# Patient Record
Sex: Female | Born: 1950
Health system: Southern US, Community
[De-identification: ages and names within clinical notes are randomized; demographics above are authoritative.]

## PROBLEM LIST (undated history)

## (undated) DIAGNOSIS — I10 Essential (primary) hypertension: Secondary | ICD-10-CM

## (undated) DIAGNOSIS — I493 Ventricular premature depolarization: Secondary | ICD-10-CM

## (undated) DIAGNOSIS — Z8701 Personal history of pneumonia (recurrent): Secondary | ICD-10-CM

## (undated) DIAGNOSIS — IMO0001 Reserved for inherently not codable concepts without codable children: Secondary | ICD-10-CM

## (undated) DIAGNOSIS — G4730 Sleep apnea, unspecified: Secondary | ICD-10-CM

## (undated) DIAGNOSIS — Z973 Presence of spectacles and contact lenses: Secondary | ICD-10-CM

## (undated) DIAGNOSIS — N393 Stress incontinence (female) (male): Secondary | ICD-10-CM

## (undated) DIAGNOSIS — E7800 Pure hypercholesterolemia, unspecified: Secondary | ICD-10-CM

## (undated) DIAGNOSIS — K635 Polyp of colon: Secondary | ICD-10-CM

## (undated) DIAGNOSIS — I471 Supraventricular tachycardia: Secondary | ICD-10-CM

## (undated) DIAGNOSIS — C541 Malignant neoplasm of endometrium: Secondary | ICD-10-CM

## (undated) DIAGNOSIS — I4719 Other supraventricular tachycardia: Secondary | ICD-10-CM

## (undated) DIAGNOSIS — Z87448 Personal history of other diseases of urinary system: Secondary | ICD-10-CM

## (undated) DIAGNOSIS — F32A Depression, unspecified: Secondary | ICD-10-CM

## (undated) DIAGNOSIS — T7840XA Allergy, unspecified, initial encounter: Secondary | ICD-10-CM

## (undated) DIAGNOSIS — R200 Anesthesia of skin: Secondary | ICD-10-CM

## (undated) DIAGNOSIS — M1990 Unspecified osteoarthritis, unspecified site: Secondary | ICD-10-CM

## (undated) DIAGNOSIS — Z8659 Personal history of other mental and behavioral disorders: Secondary | ICD-10-CM

## (undated) DIAGNOSIS — Z9889 Other specified postprocedural states: Secondary | ICD-10-CM

## (undated) DIAGNOSIS — J45909 Unspecified asthma, uncomplicated: Secondary | ICD-10-CM

## (undated) DIAGNOSIS — R112 Nausea with vomiting, unspecified: Secondary | ICD-10-CM

## (undated) DIAGNOSIS — H269 Unspecified cataract: Secondary | ICD-10-CM

## (undated) HISTORY — DX: Unspecified asthma, uncomplicated: J45.909

## (undated) HISTORY — PX: BREAST SURGERY: SHX581

## (undated) HISTORY — DX: Other supraventricular tachycardia: I47.19

## (undated) HISTORY — DX: Unspecified cataract: H26.9

## (undated) HISTORY — DX: Sleep apnea, unspecified: G47.30

## (undated) HISTORY — PX: ABDOMINAL HYSTERECTOMY: SHX81

## (undated) HISTORY — PX: TOTAL HIP ARTHROPLASTY: SHX124

## (undated) HISTORY — DX: Supraventricular tachycardia: I47.1

## (undated) HISTORY — PX: SPINE SURGERY: SHX786

## (undated) HISTORY — DX: Allergy, unspecified, initial encounter: T78.40XA

## (undated) HISTORY — DX: Depression, unspecified: F32.A

## (undated) HISTORY — DX: Essential (primary) hypertension: I10

## (undated) HISTORY — PX: COLONOSCOPY: SHX174

## (undated) HISTORY — PX: HYSTEROSCOPY WITH D & C: SHX1775

## (undated) HISTORY — DX: Malignant neoplasm of endometrium: C54.1

## (undated) HISTORY — DX: Ventricular premature depolarization: I49.3

## (undated) HISTORY — DX: Unspecified osteoarthritis, unspecified site: M19.90

## (undated) HISTORY — PX: JOINT REPLACEMENT: SHX530

## (undated) SURGERY — Surgical Case
Anesthesia: *Unknown

---

## 1979-04-07 HISTORY — PX: BREAST SURGERY: SHX581

## 1999-10-10 ENCOUNTER — Other Ambulatory Visit: Admission: RE | Admit: 1999-10-10 | Discharge: 1999-10-10 | Payer: Self-pay | Admitting: Obstetrics & Gynecology

## 1999-11-14 ENCOUNTER — Encounter: Payer: Self-pay | Admitting: *Deleted

## 1999-11-14 ENCOUNTER — Encounter: Admission: RE | Admit: 1999-11-14 | Discharge: 1999-11-14 | Payer: Self-pay | Admitting: *Deleted

## 2000-04-03 ENCOUNTER — Ambulatory Visit (HOSPITAL_COMMUNITY): Admission: RE | Admit: 2000-04-03 | Discharge: 2000-04-03 | Payer: Self-pay | Admitting: Gastroenterology

## 2000-12-12 ENCOUNTER — Encounter: Payer: Self-pay | Admitting: Orthopedic Surgery

## 2000-12-17 ENCOUNTER — Encounter: Payer: Self-pay | Admitting: Orthopedic Surgery

## 2000-12-17 ENCOUNTER — Inpatient Hospital Stay (HOSPITAL_COMMUNITY): Admission: RE | Admit: 2000-12-17 | Discharge: 2000-12-21 | Payer: Self-pay | Admitting: Orthopedic Surgery

## 2001-05-06 ENCOUNTER — Other Ambulatory Visit: Admission: RE | Admit: 2001-05-06 | Discharge: 2001-05-06 | Payer: Self-pay | Admitting: Obstetrics & Gynecology

## 2002-07-14 ENCOUNTER — Other Ambulatory Visit: Admission: RE | Admit: 2002-07-14 | Discharge: 2002-07-14 | Payer: Self-pay | Admitting: Obstetrics & Gynecology

## 2002-08-06 HISTORY — PX: TOTAL HIP ARTHROPLASTY: SHX124

## 2003-02-05 ENCOUNTER — Encounter: Payer: Self-pay | Admitting: Orthopedic Surgery

## 2003-02-09 ENCOUNTER — Encounter: Payer: Self-pay | Admitting: Orthopedic Surgery

## 2003-02-09 ENCOUNTER — Inpatient Hospital Stay (HOSPITAL_COMMUNITY): Admission: RE | Admit: 2003-02-09 | Discharge: 2003-02-13 | Payer: Self-pay | Admitting: Orthopedic Surgery

## 2003-08-20 ENCOUNTER — Other Ambulatory Visit: Admission: RE | Admit: 2003-08-20 | Discharge: 2003-08-20 | Payer: Self-pay | Admitting: Obstetrics & Gynecology

## 2004-09-06 ENCOUNTER — Other Ambulatory Visit: Admission: RE | Admit: 2004-09-06 | Discharge: 2004-09-06 | Payer: Self-pay | Admitting: Obstetrics & Gynecology

## 2005-05-11 ENCOUNTER — Ambulatory Visit (HOSPITAL_COMMUNITY): Admission: RE | Admit: 2005-05-11 | Discharge: 2005-05-11 | Payer: Self-pay | Admitting: Gastroenterology

## 2005-10-19 ENCOUNTER — Other Ambulatory Visit: Admission: RE | Admit: 2005-10-19 | Discharge: 2005-10-19 | Payer: Self-pay | Admitting: Obstetrics & Gynecology

## 2010-07-06 ENCOUNTER — Encounter: Payer: Self-pay | Admitting: Family Medicine

## 2010-07-06 LAB — HM COLONOSCOPY: HM Colonoscopy: 5

## 2010-07-06 LAB — HM PAP SMEAR

## 2010-07-06 LAB — CONVERTED CEMR LAB
Pap Smear: NORMAL
Pap Smear: NORMAL

## 2010-07-21 LAB — HM MAMMOGRAPHY: HM Mammogram: NORMAL

## 2010-09-15 ENCOUNTER — Ambulatory Visit (INDEPENDENT_AMBULATORY_CARE_PROVIDER_SITE_OTHER): Payer: BC Managed Care – PPO | Admitting: Family Medicine

## 2010-09-15 ENCOUNTER — Encounter (INDEPENDENT_AMBULATORY_CARE_PROVIDER_SITE_OTHER): Payer: Self-pay | Admitting: *Deleted

## 2010-09-15 ENCOUNTER — Encounter: Payer: Self-pay | Admitting: Family Medicine

## 2010-09-15 DIAGNOSIS — J301 Allergic rhinitis due to pollen: Secondary | ICD-10-CM | POA: Insufficient documentation

## 2010-09-15 DIAGNOSIS — I10 Essential (primary) hypertension: Secondary | ICD-10-CM | POA: Insufficient documentation

## 2010-09-15 DIAGNOSIS — Z860100 Personal history of colon polyps, unspecified: Secondary | ICD-10-CM | POA: Insufficient documentation

## 2010-09-15 DIAGNOSIS — J4520 Mild intermittent asthma, uncomplicated: Secondary | ICD-10-CM | POA: Insufficient documentation

## 2010-09-15 DIAGNOSIS — K219 Gastro-esophageal reflux disease without esophagitis: Secondary | ICD-10-CM | POA: Insufficient documentation

## 2010-09-15 DIAGNOSIS — R002 Palpitations: Secondary | ICD-10-CM | POA: Insufficient documentation

## 2010-09-15 DIAGNOSIS — N39498 Other specified urinary incontinence: Secondary | ICD-10-CM | POA: Insufficient documentation

## 2010-09-15 DIAGNOSIS — F411 Generalized anxiety disorder: Secondary | ICD-10-CM | POA: Insufficient documentation

## 2010-09-15 DIAGNOSIS — E7800 Pure hypercholesterolemia, unspecified: Secondary | ICD-10-CM | POA: Insufficient documentation

## 2010-09-15 DIAGNOSIS — Z87448 Personal history of other diseases of urinary system: Secondary | ICD-10-CM | POA: Insufficient documentation

## 2010-09-15 DIAGNOSIS — IMO0002 Reserved for concepts with insufficient information to code with codable children: Secondary | ICD-10-CM | POA: Insufficient documentation

## 2010-09-15 DIAGNOSIS — M4800 Spinal stenosis, site unspecified: Secondary | ICD-10-CM | POA: Insufficient documentation

## 2010-09-15 DIAGNOSIS — Z8659 Personal history of other mental and behavioral disorders: Secondary | ICD-10-CM | POA: Insufficient documentation

## 2010-09-15 LAB — CONVERTED CEMR LAB
Cholesterol: 161 mg/dL
HCT: 38.4 %
HDL: 82 mg/dL
Hemoglobin: 13 g/dL
LDL Cholesterol: 55 mg/dL
Platelets: 245 10*3/uL
RBC: 4.31 M/uL
Triglycerides: 118 mg/dL
VLDL: 24 mg/dL
WBC: 5.1 10*3/uL

## 2010-09-20 ENCOUNTER — Other Ambulatory Visit: Payer: Self-pay | Admitting: Family Medicine

## 2010-09-20 ENCOUNTER — Encounter (INDEPENDENT_AMBULATORY_CARE_PROVIDER_SITE_OTHER): Payer: Self-pay | Admitting: *Deleted

## 2010-09-20 ENCOUNTER — Other Ambulatory Visit (INDEPENDENT_AMBULATORY_CARE_PROVIDER_SITE_OTHER): Payer: BC Managed Care – PPO

## 2010-09-20 DIAGNOSIS — R002 Palpitations: Secondary | ICD-10-CM

## 2010-09-20 DIAGNOSIS — E7800 Pure hypercholesterolemia, unspecified: Secondary | ICD-10-CM

## 2010-09-20 LAB — HEPATIC FUNCTION PANEL
ALT: 26 U/L (ref 0–35)
AST: 27 U/L (ref 0–37)
Albumin: 3.5 g/dL (ref 3.5–5.2)
Alkaline Phosphatase: 53 U/L (ref 39–117)
Bilirubin, Direct: 0 mg/dL (ref 0.0–0.3)
Total Bilirubin: 0.5 mg/dL (ref 0.3–1.2)
Total Protein: 6.3 g/dL (ref 6.0–8.3)

## 2010-09-20 LAB — LIPID PANEL
Cholesterol: 166 mg/dL (ref 0–200)
HDL: 81 mg/dL (ref >39.00)
LDL Cholesterol: 64 mg/dL (ref 0–99)
Total CHOL/HDL Ratio: 2
Triglycerides: 106 mg/dL (ref 0.0–149.0)
VLDL: 21.2 mg/dL (ref 0.0–40.0)

## 2010-09-20 LAB — BASIC METABOLIC PANEL
BUN: 13 mg/dL (ref 6–23)
CO2: 32 mEq/L (ref 19–32)
Calcium: 9.2 mg/dL (ref 8.4–10.5)
Chloride: 104 mEq/L (ref 96–112)
Creatinine, Ser: 0.7 mg/dL (ref 0.4–1.2)
GFR: 91.98 mL/min (ref >60.00)
Glucose, Bld: 82 mg/dL (ref 70–99)
Potassium: 4 mEq/L (ref 3.5–5.1)
Sodium: 141 mEq/L (ref 135–145)

## 2010-09-20 LAB — TSH: TSH: 0.68 u[IU]/mL (ref 0.35–5.50)

## 2010-09-21 LAB — CBC WITH DIFFERENTIAL/PLATELET
Basophils Absolute: 0 10*3/uL (ref 0.0–0.1)
Basophils Relative: 0.6 % (ref 0.0–3.0)
Eosinophils Absolute: 0.2 10*3/uL (ref 0.0–0.7)
Eosinophils Relative: 3.6 % (ref 0.0–5.0)
HCT: 36.8 % (ref 36.0–46.0)
Hemoglobin: 12.4 g/dL (ref 12.0–15.0)
Lymphocytes Relative: 38.4 % (ref 12.0–46.0)
Lymphs Abs: 2.1 10*3/uL (ref 0.7–4.0)
MCHC: 33.7 g/dL (ref 30.0–36.0)
MCV: 90.4 fl (ref 78.0–100.0)
Monocytes Absolute: 0.4 10*3/uL (ref 0.1–1.0)
Monocytes Relative: 6.7 % (ref 3.0–12.0)
Neutro Abs: 2.7 10*3/uL (ref 1.4–7.7)
Neutrophils Relative %: 50.7 % (ref 43.0–77.0)
Platelets: 229 10*3/uL (ref 150.0–400.0)
RBC: 4.07 Mil/uL (ref 3.87–5.11)
RDW: 15.1 % — ABNORMAL HIGH (ref 11.5–14.6)
WBC: 5.4 10*3/uL (ref 4.5–10.5)

## 2010-09-21 NOTE — Assessment & Plan Note (Signed)
Summary: new pt to est JRT   Vital Signs:  Patient profile:   60 year old female Height:      68 inches Weight:      154.50 pounds BMI:     23.58 Temp:     98.4 degrees F oral Pulse rate:   72 / minute Pulse rhythm:   regular BP sitting:   120 / 60  (left arm) Cuff size:   regular  Vitals Entered By: Benny Lennert CMA Duncan Dull) (September 15, 2010 10:08 AM)  History of Present Illness: Chief complaint new patient to be established   Here to establish...previously saw Dr. Larina Bras.  Spinal stenosis, DDD: MRI last week per Dr. Renae Fickle at [facility]  Had epidural 4 days ago..has helped a lot. Not currently on pain meds.   HTN, well controlled on HCTZ  High cholesterol: well controlled 09/02/2009 on lipitor 10 mg.  LDL 55, HDL 82 Due for yearly reeval and CPX.   Sees Dr. Jennette Kettle for GYN.Marland Kitchen using premarin cream for stress incontinence, helps some. Also on proimetrium.  Has occassionally noted skipped heartbeat in past  In last few month much more frequent.. has noted heart pounding occ, flip flopping, pause.. has tried to decrease coffe and soda. Occuring every night when she quiets down.. lasts few minutes. notes 3 "blips' in 1 minute  no additional symtpoms.  Has had nm.l EKGs in past.   Some stress at work  Preventive Screening-Counseling & Management  Alcohol-Tobacco     Smoking Status: never  Caffeine-Diet-Exercise     Does Patient Exercise: yes      Drug Use:  no.    Allergies (verified): No Known Drug Allergies  Past History:  Past Surgical History: HIP REPLACEMENT RIGHT(2002) LEFT (2004) BREAST BIOPSY 1983 AND 2010  Family History: father: bladder cancer, aortic valve, prostate cancer, high cholesterol  mother: HTN one sister: healthy no MI ibefore age 48 Great aunt with pancreatic cancer MGM: colon cancer  Social History: Occupation: Publishing rights manager.. works at [employer] Single Never Smoked Alcohol , 1 glass of wine 3 days a week Drug use-no Regular  exercise-yes, 5-6 days a week, YOGA Diet: healthy Occupation:  employed Smoking Status:  never Drug Use:  no Does Patient Exercise:  yes  Review of Systems General:  Denies fatigue and fever. CV:  Denies chest pain or discomfort and swelling of feet. Resp:  Denies shortness of breath. GI:  Denies abdominal pain and bloody stools. GU:  Denies abnormal vaginal bleeding and dysuria. Derm:  Denies lesion(s). Psych:  Denies anxiety and depression.  Physical Exam  General:  Well-developed,well-nourished,in no acute distress; alert,appropriate and cooperative throughout examination Eyes:  No corneal or conjunctival inflammation noted. EOMI. Perrla. Funduscopic exam benign, without hemorrhages, exudates or papilledema. Vision grossly normal. Ears:  External ear exam shows no significant lesions or deformities.  Otoscopic examination reveals clear canals, tympanic membranes are intact bilaterally without bulging, retraction, inflammation or discharge. Hearing is grossly normal bilaterally. Nose:  External nasal examination shows no deformity or inflammation. Nasal mucosa are pink and moist without lesions or exudates. Mouth:  Oral mucosa and oropharynx without lesions or exudates.  Teeth in good repair. Neck:  no carotid bruit or thyromegaly no cervical or supraclavicular lymphadenopathy  Lungs:  Normal respiratory effort, chest expands symmetrically. Lungs are clear to auscultation, no crackles or wheezes. Heart:  Normal rate and regular rhythm. S1 and S2 normal without gallop, murmur, click, rub or other extra sounds. Abdomen:  Bowel sounds positive,abdomen soft  and non-tender without masses, organomegaly or hernias noted. Pulses:  R and L posterior tibial pulses are full and equal bilaterally  Extremities:  no edema Skin:  Intact without suspicious lesions or rashes Psych:  Cognition and judgment appear intact. Alert and cooperative with normal attention span and concentration. No apparent  delusions, illusions, hallucinations   Impression & Recommendations:  Problem # 1:  PALPITATIONS (ICD-785.1) Eval with labs. Avoid caffeine, decongestant, increase water ... if continuing  will consider Holter monitor. Orders: EKG w/ Interpretation (93000): NSR, no ST canges, no Q waves, no LVH no arrythmia.  Problem # 2:  HYPERCHOLESTEROLEMIA (ICD-272.0) Due for reecal. Encouraged exercise, weight loss, healthy eating habits.  Her updated medication list for this problem includes:    Lipitor 10 Mg Tabs (Atorvastatin calcium) ..... One tablet daily  Problem # 3:  ESSENTIAL HYPERTENSION, BENIGN (ICD-401.1) Well controlled. Continue current medication.  Her updated medication list for this problem includes:    Hydrochlorothiazide 25 Mg Tabs (Hydrochlorothiazide) ..... One tablet daily  Complete Medication List: 1)  Lipitor 10 Mg Tabs (Atorvastatin calcium) .... One tablet daily 2)  Hydrochlorothiazide 25 Mg Tabs (Hydrochlorothiazide) .... One tablet daily 3)  Prometrium 200 Mg Caps (Progesterone micronized) .... Take  for 12 days every 3 months 4)  Multivitamins Tabs (Multiple vitamin) .... One tablet daily 5)  Vitamin B-6 Cr 200 Mg Cr-tabs (Pyridoxine hcl) .... One tablet daily 6)  Caltrate 600+d 600-400 Mg-unit Tabs (Calcium carbonate-vitamin d) .Marland Kitchen.. 1 tablet daily 7)  Allegra Allergy 180 Mg Tabs (Fexofenadine hcl) .... As needed 8)  Rhinocort Aqua 32 Mcg/act Susp (Budesonide) .... Prn 9)  Proventil Hfa 108 (90 Base) Mcg/act Aers (Albuterol sulfate) .... As needed 10)  Premarin 0.625 Mg/gm Crea (Estrogens, conjugated) .... As needed  Patient Instructions: 1)  Fasting lipids, CMET Dx 272.0, TSH, cbc Dx 785.1 2)  Schedule CPX in next few weeks.    Orders Added: 1)  EKG w/ Interpretation [93000] 2)  New Patient Level III [62130]    Current Allergies (reviewed today): No known allergies   Flu Vaccine Result Date:  06/06/2010 Flu Vaccine Result:  given Flu Vaccine Next  Due:  1 yr TD Result Date:  08/29/2007 TD Result:  given TD Next Due:  10 yr Flex Sig Next Due:  Not Indicated Colonoscopy Result Date:  07/06/2010 Colonoscopy Result:  5 polyps Colonoscopy Next Due:  5 yr Hemoccult Next Due:  Not Indicated PAP Result Date:  07/06/2010 PAP Result:  normal PAP Next Due:  1 yr Mammogram Result Date:  07/21/2010 Mammogram Result:  normal Mammogram Next Due:  1 yr DXA 12/2009 nml.

## 2010-09-21 NOTE — Miscellaneous (Signed)
  Clinical Lists Changes  Observations: Added new observation of VLDL: 24 mg/dL (16/05/9603 54:09) Added new observation of LDL: 55 mg/dL (81/19/1478 29:56) Added new observation of HDL: 82 mg/dL (21/30/8657 84:69) Added new observation of TRIGLYC TOT: 118 mg/dL (62/95/2841 32:44) Added new observation of CHOLESTEROL: 161 mg/dL (08/08/7251 66:44) Added new observation of PLATELETK/UL: 245 K/uL (09/15/2010 16:34) Added new observation of HCT: 38.4 % (09/15/2010 16:34) Added new observation of HGB: 13.0 g/dL (03/47/4259 56:38) Added new observation of RBC M/UL: 4.31 M/uL (09/15/2010 16:34) Added new observation of WBC COUNT: 5.1 10*3/microliter (09/15/2010 16:34)

## 2010-09-22 ENCOUNTER — Encounter: Payer: Self-pay | Admitting: Family Medicine

## 2010-09-22 ENCOUNTER — Encounter (INDEPENDENT_AMBULATORY_CARE_PROVIDER_SITE_OTHER): Payer: BC Managed Care – PPO | Admitting: Family Medicine

## 2010-09-22 DIAGNOSIS — R002 Palpitations: Secondary | ICD-10-CM

## 2010-09-22 DIAGNOSIS — I10 Essential (primary) hypertension: Secondary | ICD-10-CM

## 2010-09-22 DIAGNOSIS — E7800 Pure hypercholesterolemia, unspecified: Secondary | ICD-10-CM

## 2010-09-27 NOTE — Assessment & Plan Note (Signed)
Summary: CPE JRT   Vital Signs:  Patient profile:   60 year old female Height:      67.5 inches Weight:      152 pounds Temp:     98.5 degrees F oral Pulse rate:   88 / minute Pulse rhythm:   regular BP sitting:   132 / 80  (left arm) Cuff size:   regular  Vitals Entered By: Lowella Petties CMA, AAMA (September 22, 2010 3:48 PM) CC: CPX   History of Present Illness: The patient is here for Follow Up.  She seens GYN for vaginal exam.  Breast exam not done at that time.     At last OV: Problem # 1:  PALPITATIONS (ICD-785.1) Eval with labs. Avoid caffeine, decongestant, increase water ... if continuing  will consider Holter monitor. Orders: EKG w/ Interpretation (93000): NSR, no ST canges, no Q waves, no LVH no arrythmia.  Problem # 2:  HYPERCHOLESTEROLEMIA (ICD-272.0) Due for reeval. Encouraged exercise, weight loss, healthy eating habits.  Her updated medication list for this problem includes:    Lipitor 10 Mg Tabs (Atorvastatin calcium) ..... One tablet daily  Problem # 3:  ESSENTIAL HYPERTENSION, BENIGN (ICD-401.1) Well controlled. Continue current medication.  Her updated medication list for this problem includes:    Hydrochlorothiazide 25 Mg Tabs (Hydrochlorothiazide) ..... One tablet daily   Since last OV..palpitations: HAS noted less frequent episodes on stopping caffeine.  On lab eval.. cholesterol appears  well controlle don lipitor 10 mg daily.  Nml liver function.  HTN remains well controlled.  Nml thyroid, no anemia.   Problems Prior to Update: 1)  Palpitations  (ICD-785.1) 2)  Hematuria, Microscopic, Hx of  (ICD-V13.09) 3)  Urinary Incontinence, Stress  (ICD-788.39) 4)  Colonic Polyps, Adenomatous, Hx of  (ICD-V12.72) 5)  Hypercholesterolemia  (ICD-272.0) 6)  Essential Hypertension, Benign  (ICD-401.1) 7)  Gerd  (ICD-530.81) 8)  Depression, Situational, Hx of  (ICD-V11.8) 9)  Allergic Rhinitis, Seasonal  (ICD-477.0) 10)  Asthma, Intermittent, Mild   (ICD-493.90) 11)  Degenerative Disc Disease  (ICD-722.6) 12)  Spinal Stenosis  (ICD-724.00)  Current Medications (verified): 1)  Lipitor 10 Mg Tabs (Atorvastatin Calcium) .... One Tablet Daily 2)  Hydrochlorothiazide 25 Mg Tabs (Hydrochlorothiazide) .... One Tablet Daily 3)  Prometrium 200 Mg Caps (Progesterone Micronized) .... Take  For 12 Days Every 3 Months 4)  Multivitamins  Tabs (Multiple Vitamin) .... One Tablet Daily 5)  Vitamin B-6 Cr 200 Mg Cr-Tabs (Pyridoxine Hcl) .... One Tablet Daily 6)  Caltrate 600+d 600-400 Mg-Unit Tabs (Calcium Carbonate-Vitamin D) .Marland Kitchen.. 1 Tablet Daily 7)  Allegra Allergy 180 Mg Tabs (Fexofenadine Hcl) .... As Needed 8)  Rhinocort Aqua 32 Mcg/act Susp (Budesonide) .... Prn 9)  Proventil Hfa 108 (90 Base) Mcg/act Aers (Albuterol Sulfate) .... As Needed 10)  Premarin 0.625 Mg/gm Crea (Estrogens, Conjugated) .... As Needed  Allergies (verified): No Known Drug Allergies  Past History:  Past medical, surgical, family and social histories (including risk factors) reviewed, and no changes noted (except as noted below).  Past Surgical History: Reviewed history from 09/15/2010 and no changes required. HIP REPLACEMENT RIGHT(2002) LEFT (2004) BREAST BIOPSY 1983 AND 2010  Family History: Reviewed history from 09/15/2010 and no changes required. father: bladder cancer, aortic valve, prostate cancer, high cholesterol  mother: HTN one sister: healthy no MI ibefore age 34 Great aunt with pancreatic cancer MGM: colon cancer  Social History: Reviewed history from 09/15/2010 and no changes required. 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 CV:  Denies chest pain or discomfort. Resp:  Denies shortness of breath. GI:  Denies abdominal pain and bloody stools. GU:   Denies dysuria.  Physical Exam  General:  Well-developed,well-nourished,in no acute distress; alert,appropriate and cooperative throughout examination Eyes:  No corneal or conjunctival inflammation noted. EOMI. Perrla. Funduscopic exam benign, without hemorrhages, exudates or papilledema. Vision grossly normal. Ears:  External ear exam shows no significant lesions or deformities.  Otoscopic examination reveals clear canals, tympanic membranes are intact bilaterally without bulging, retraction, inflammation or discharge. Hearing is grossly normal bilaterally. Nose:  External nasal examination shows no deformity or inflammation. Nasal mucosa are pink and moist without lesions or exudates. Mouth:  Oral mucosa and oropharynx without lesions or exudates.  Teeth in good repair. Neck:  no carotid bruit or thyromegaly no cervical or supraclavicular lymphadenopathy  Chest Wall:  No deformities, masses, or tenderness noted. Breasts:  No mass, nodules, thickening, tenderness, bulging, retraction, inflamation, nipple discharge or skin changes noted.   Lungs:  Normal respiratory effort, chest expands symmetrically. Lungs are clear to auscultation, no crackles or wheezes. Heart:  Normal rate and regular rhythm. S1 and S2 normal without gallop, murmur, click, rub or other extra sounds. Abdomen:  Bowel sounds positive,abdomen soft and non-tender without masses, organomegaly or hernias noted. Msk:  No deformity or scoliosis noted of thoracic or lumbar spine.   Pulses:  R and L posterior tibial pulses are full and equal bilaterally  Extremities:  no edema  Skin:  Intact without suspicious lesions or rashes Psych:  Cognition and judgment appear intact. Alert and cooperative with normal attention span and concentration. No apparent delusions, illusions, hallucinations   Impression & Recommendations:  Problem # 1:  PALPITATIONS (ICD-785.1) Assessment Improved stable, improving on less caffeine.   Problem # 2:   HYPERCHOLESTEROLEMIA (ICD-272.0) Assessment: Unchanged Well controlled. Continue current medication.  Her updated medication list for this problem includes:    Lipitor 10 Mg Tabs (Atorvastatin calcium) ..... One tablet daily  Labs Reviewed: SGOT: 27 (09/20/2010)   SGPT: 26 (09/20/2010)   HDL:81.00 (09/20/2010), 82 (09/15/2010)  LDL:64 (09/20/2010), 55 (09/15/2010)  Chol:166 (09/20/2010), 161 (09/15/2010)  Trig:106.0 (09/20/2010), 118 (09/15/2010)  Problem # 3:  ESSENTIAL HYPERTENSION, BENIGN (ICD-401.1) Assessment: Unchanged Well controlled. Continue current medication.  Her updated medication list for this problem includes:    Hydrochlorothiazide 25 Mg Tabs (Hydrochlorothiazide) ..... One tablet daily  Problem # 4:  Preventive Health Care (ICD-V70.0) Assessment: Comment Only The patient's preventative maintenance and recommended screening tests for an annual wellness exam were reviewed in full today. Brought up to date unless services declined.  Counselled on the importance of diet, exercise, and its role in overall health and mortality. The patient's FH and SH was reviewed, including their home life, tobacco status, and drug and alcohol status.     Complete Medication List: 1)  Lipitor 10 Mg Tabs (Atorvastatin calcium) .... One tablet daily 2)  Hydrochlorothiazide 25 Mg Tabs (Hydrochlorothiazide) .... One tablet daily 3)  Prometrium 200 Mg Caps (Progesterone micronized) .... Take  for 12 days every 3 months 4)  Multivitamins Tabs (Multiple vitamin) .... One tablet daily 5)  Vitamin B-6 Cr 200 Mg Cr-tabs (Pyridoxine hcl) .... One tablet daily 6)  Caltrate 600+d 600-400 Mg-unit Tabs (Calcium carbonate-vitamin d) .Marland Kitchen.. 1 tablet daily 7)  Allegra Allergy  180 Mg Tabs (Fexofenadine hcl) .... As needed 8)  Rhinocort Aqua 32 Mcg/act Susp (Budesonide) .... Prn 9)  Proventil Hfa 108 (90 Base) Mcg/act Aers (Albuterol sulfate) .... As needed 10)  Premarin 0.625 Mg/gm Crea (Estrogens,  conjugated) .... As needed  Patient Instructions: 1)  Call if interested in shingles vaccine.. make nurse visit appt. 2)  Follow up in 1 year.   Orders Added: 1)  Est. Patient Level IV [81191]    Prior Medications (reviewed today): LIPITOR 10 MG TABS (ATORVASTATIN CALCIUM) one tablet daily HYDROCHLOROTHIAZIDE 25 MG TABS (HYDROCHLOROTHIAZIDE) one tablet daily PROMETRIUM 200 MG CAPS (PROGESTERONE MICRONIZED) take  for 12 days every 3 months MULTIVITAMINS  TABS (MULTIPLE VITAMIN) one tablet daily VITAMIN B-6 CR 200 MG CR-TABS (PYRIDOXINE HCL) ONE TABLET DAILY CALTRATE 600+D 600-400 MG-UNIT TABS (CALCIUM CARBONATE-VITAMIN D) 1 TABLET DAILY ALLEGRA ALLERGY 180 MG TABS (FEXOFENADINE HCL) AS NEEDED RHINOCORT AQUA 32 MCG/ACT SUSP (BUDESONIDE) PRN PROVENTIL HFA 108 (90 BASE) MCG/ACT AERS (ALBUTEROL SULFATE) as needed PREMARIN 0.625 MG/GM CREA (ESTROGENS, CONJUGATED) as needed Current Allergies (reviewed today): No known allergies

## 2010-11-07 ENCOUNTER — Telehealth: Payer: Self-pay | Admitting: Family Medicine

## 2010-11-07 NOTE — Telephone Encounter (Signed)
Records received for this pt...appears that last mammogram was done in 01/2010... Recommendation for repeat in 6 months given previous abnormality... Has pt had this mammo yet? If not let me know and I will order.

## 2010-11-08 NOTE — Telephone Encounter (Signed)
Patient advised as instructed via telephone.  Her last mammogram follow up was 07/21/2010.

## 2010-11-16 ENCOUNTER — Ambulatory Visit: Payer: BC Managed Care – PPO

## 2010-11-17 ENCOUNTER — Ambulatory Visit: Payer: BC Managed Care – PPO

## 2010-11-24 ENCOUNTER — Ambulatory Visit (INDEPENDENT_AMBULATORY_CARE_PROVIDER_SITE_OTHER): Payer: BC Managed Care – PPO | Admitting: Family Medicine

## 2010-11-24 DIAGNOSIS — Z23 Encounter for immunization: Secondary | ICD-10-CM

## 2010-11-24 DIAGNOSIS — Z2911 Encounter for prophylactic immunotherapy for respiratory syncytial virus (RSV): Secondary | ICD-10-CM

## 2010-11-24 NOTE — Progress Notes (Signed)
  Subjective:    Patient ID: Christina Griffith, female    DOB: Dec 27, 1950, 60 y.o.   MRN: [id number]  HPIVaccine given.    Review of Systems     Objective:   Physical Exam        Assessment & Plan:

## 2010-11-29 ENCOUNTER — Encounter: Payer: Self-pay | Admitting: Family Medicine

## 2011-01-10 ENCOUNTER — Other Ambulatory Visit: Payer: Self-pay | Admitting: *Deleted

## 2011-01-10 MED ORDER — ATORVASTATIN CALCIUM 10 MG PO TABS: 10.0000 mg | ORAL_TABLET | Freq: Every day | ORAL | Status: DC

## 2011-01-31 ENCOUNTER — Other Ambulatory Visit: Payer: Self-pay | Admitting: Family Medicine

## 2011-04-12 ENCOUNTER — Telehealth: Payer: Self-pay | Admitting: *Deleted

## 2011-04-12 NOTE — Telephone Encounter (Signed)
Pt has brought in a form for you to sign stating that she has had a physical.  Form is on your desk.

## 2011-07-27 ENCOUNTER — Encounter: Payer: Self-pay | Admitting: Family Medicine

## 2011-07-30 ENCOUNTER — Encounter: Payer: Self-pay | Admitting: *Deleted

## 2011-08-29 ENCOUNTER — Other Ambulatory Visit: Payer: Self-pay | Admitting: Family Medicine

## 2011-09-10 ENCOUNTER — Other Ambulatory Visit: Payer: BC Managed Care – PPO

## 2011-09-13 ENCOUNTER — Encounter: Payer: BC Managed Care – PPO | Admitting: Family Medicine

## 2011-09-26 ENCOUNTER — Other Ambulatory Visit: Payer: BC Managed Care – PPO

## 2011-09-28 ENCOUNTER — Encounter: Payer: BC Managed Care – PPO | Admitting: Family Medicine

## 2011-12-04 ENCOUNTER — Encounter: Payer: Self-pay | Admitting: Family Medicine

## 2011-12-04 ENCOUNTER — Telehealth: Payer: Self-pay | Admitting: Family Medicine

## 2011-12-04 DIAGNOSIS — E7800 Pure hypercholesterolemia, unspecified: Secondary | ICD-10-CM

## 2011-12-04 DIAGNOSIS — I10 Essential (primary) hypertension: Secondary | ICD-10-CM

## 2011-12-04 NOTE — Telephone Encounter (Signed)
Message copied by Excell Seltzer on Tue Dec 04, 2011 10:40 AM ------      Message from: Alvina Chou      Created: Fri Nov 30, 2011  3:07 PM       Patient is scheduled for CPX labs, please order future labs, Thanks , Camelia Eng

## 2011-12-07 ENCOUNTER — Other Ambulatory Visit (INDEPENDENT_AMBULATORY_CARE_PROVIDER_SITE_OTHER): Payer: BC Managed Care – PPO

## 2011-12-07 ENCOUNTER — Encounter: Payer: BC Managed Care – PPO | Admitting: Family Medicine

## 2011-12-07 DIAGNOSIS — I10 Essential (primary) hypertension: Secondary | ICD-10-CM

## 2011-12-07 DIAGNOSIS — E7800 Pure hypercholesterolemia, unspecified: Secondary | ICD-10-CM

## 2011-12-07 LAB — LIPID PANEL
Cholesterol: 167 mg/dL (ref 0–200)
HDL: 82.3 mg/dL (ref >39.00)
LDL Cholesterol: 56 mg/dL (ref 0–99)
Total CHOL/HDL Ratio: 2
Triglycerides: 146 mg/dL (ref 0.0–149.0)
VLDL: 29.2 mg/dL (ref 0.0–40.0)

## 2011-12-07 LAB — COMPREHENSIVE METABOLIC PANEL
ALT: 24 U/L (ref 0–35)
AST: 31 U/L (ref 0–37)
Albumin: 3.8 g/dL (ref 3.5–5.2)
Alkaline Phosphatase: 60 U/L (ref 39–117)
BUN: 13 mg/dL (ref 6–23)
CO2: 30 mEq/L (ref 19–32)
Calcium: 9.4 mg/dL (ref 8.4–10.5)
Chloride: 103 mEq/L (ref 96–112)
Creatinine, Ser: 0.8 mg/dL (ref 0.4–1.2)
GFR: 79.53 mL/min (ref >60.00)
Glucose, Bld: 85 mg/dL (ref 70–99)
Potassium: 3.7 mEq/L (ref 3.5–5.1)
Sodium: 140 mEq/L (ref 135–145)
Total Bilirubin: 0.5 mg/dL (ref 0.3–1.2)
Total Protein: 7 g/dL (ref 6.0–8.3)

## 2011-12-14 ENCOUNTER — Ambulatory Visit (INDEPENDENT_AMBULATORY_CARE_PROVIDER_SITE_OTHER): Payer: BC Managed Care – PPO | Admitting: Family Medicine

## 2011-12-14 ENCOUNTER — Encounter: Payer: Self-pay | Admitting: Family Medicine

## 2011-12-14 VITALS — BP 120/82 | HR 84 | Temp 98.4°F | Ht 67.5 in | Wt 155.4 lb

## 2011-12-14 DIAGNOSIS — Z23 Encounter for immunization: Secondary | ICD-10-CM

## 2011-12-14 DIAGNOSIS — E7800 Pure hypercholesterolemia, unspecified: Secondary | ICD-10-CM

## 2011-12-14 DIAGNOSIS — N39498 Other specified urinary incontinence: Secondary | ICD-10-CM

## 2011-12-14 DIAGNOSIS — R002 Palpitations: Secondary | ICD-10-CM

## 2011-12-14 DIAGNOSIS — I10 Essential (primary) hypertension: Secondary | ICD-10-CM

## 2011-12-14 NOTE — Assessment & Plan Note (Signed)
Resolved with stopping caffeine. 

## 2011-12-14 NOTE — Assessment & Plan Note (Signed)
Plans follow up with Dr. Ronal Fear.

## 2011-12-14 NOTE — Patient Instructions (Signed)
Continue great work on [employer] and regular exercise.

## 2011-12-14 NOTE — Assessment & Plan Note (Signed)
Well controlled. Continue current medication.  

## 2011-12-14 NOTE — Progress Notes (Signed)
Subjective:    Patient ID: Christina Griffith, female    DOB: 1951-06-15, 61 y.o.   MRN: [id number]  HPI The patient is here for annual wellness exam and preventative care.    Hypertension:  Well controlled on HCTZ.  Using medication without problems or lightheadedness: None Chest pain with exertion:None Edema:None Short of breath:None Average home BPs: 120/70 Other issues: palpitation resolved with caffeine  Elevated Cholesterol: Well controlled on lipitor 10 mg daily Using medications without problems:None Muscle aches: None Diet compliance:Good. Exercise:Speed walking, treadmill 5 days a week Other complaints:  Hx of  Mild intermittant asthma: stable, last used inhaler in 07/2011. Treated for bacterial bronchitis.  Last 2006 pneumonia vaccine  Urinary incontinence, stress: Seen Dr. Ronal Fear in past, plans to go back.  Started potassium, Mg for leg craps in last year. Has helped and K levels stable. Review of Systems  Constitutional: Negative for fever, fatigue and unexpected weight change.  HENT: Negative for ear pain, congestion, sore throat, sneezing, trouble swallowing and sinus pressure.   Eyes: Negative for pain and itching.  Respiratory: Negative for cough, shortness of breath and wheezing.   Cardiovascular: Negative for chest pain, palpitations and leg swelling.  Gastrointestinal: Negative for nausea, abdominal pain, diarrhea, constipation and blood in stool.  Genitourinary: Negative for dysuria, hematuria, vaginal discharge and difficulty urinating.  Skin: Negative for rash.  Neurological: Negative for syncope, weakness, light-headedness, numbness and headaches.  Psychiatric/Behavioral: Negative for confusion and dysphoric mood. The patient is not nervous/anxious.        Objective:   Physical Exam  Constitutional: Vital signs are normal. She appears well-developed and well-nourished. She is cooperative.  Non-toxic appearance. She does not appear ill. No distress.    HENT:  Head: Normocephalic.  Right Ear: Hearing, tympanic membrane, external ear and ear canal normal.  Left Ear: Hearing, tympanic membrane, external ear and ear canal normal.  Nose: Nose normal.  Eyes: Conjunctivae, EOM and lids are normal. Pupils are equal, round, and reactive to light. No foreign bodies found.  Neck: Trachea normal and normal range of motion. Neck supple. Carotid bruit is not present. No mass and no thyromegaly present.  Cardiovascular: Normal rate, regular rhythm, S1 normal, S2 normal, normal heart sounds and intact distal pulses.  Exam reveals no gallop.   No murmur heard. Pulmonary/Chest: Effort normal and breath sounds normal. No respiratory distress. She has no wheezes. She has no rhonchi. She has no rales.  Abdominal: Soft. Normal appearance and bowel sounds are normal. She exhibits no distension, no fluid wave, no abdominal bruit and no mass. There is no hepatosplenomegaly. There is no tenderness. There is no rebound, no guarding and no CVA tenderness. No hernia.  Lymphadenopathy:    She has no cervical adenopathy.    She has no axillary adenopathy.  Neurological: She is alert. She has normal strength. No cranial nerve deficit or sensory deficit.  Skin: Skin is warm, dry and intact. No rash noted.  Psychiatric: Her speech is normal and behavior is normal. Judgment normal. Her mood appears not anxious. Cognition and memory are normal. She does not exhibit a depressed mood.          Assessment & Plan:  The patient's preventative maintenance and recommended screening tests for an annual wellness exam were reviewed in full today. Brought up to date unless services declined.  Counselled on the importance of diet, exercise, and its role in overall health and mortality. The patient's FH and SH was reviewed, including their home life,  tobacco status, and drug and alcohol status.   Vaccines:uptodate with Td,zoster  Nonsmoker Mammo: nml in 07/2011 DVE/PAP:Sees Dr.  Jennette Kettle for GYN Has appt 01/2012).Marland Kitchen using premarin cream for stress incontinence, helps some.  Also on proimetrium. Colon:Colonoscopy Result Date: 07/06/2010  Colonoscopy Result: 5 polyps  Colonoscopy Next Due: 5 yr DEXA:12/2009 nml

## 2012-01-15 ENCOUNTER — Other Ambulatory Visit: Payer: Self-pay | Admitting: Family Medicine

## 2012-03-25 ENCOUNTER — Other Ambulatory Visit: Payer: Self-pay | Admitting: Family Medicine

## 2012-04-24 ENCOUNTER — Other Ambulatory Visit: Payer: Self-pay | Admitting: Family Medicine

## 2012-04-24 ENCOUNTER — Other Ambulatory Visit: Payer: Self-pay

## 2012-04-24 MED ORDER — ALBUTEROL SULFATE HFA 108 (90 BASE) MCG/ACT IN AERS: 2.0000 | INHALATION_SPRAY | Freq: Four times a day (QID) | RESPIRATORY_TRACT | Status: DC | PRN

## 2012-04-25 ENCOUNTER — Other Ambulatory Visit: Payer: Self-pay

## 2012-08-25 ENCOUNTER — Encounter: Payer: Self-pay | Admitting: Family Medicine

## 2012-09-18 ENCOUNTER — Ambulatory Visit: Payer: BC Managed Care – PPO | Admitting: Gynecologic Oncology

## 2012-09-23 ENCOUNTER — Encounter: Payer: Self-pay | Admitting: *Deleted

## 2012-09-24 ENCOUNTER — Ambulatory Visit: Payer: BC Managed Care – PPO | Attending: Gynecologic Oncology | Admitting: Gynecologic Oncology

## 2012-09-24 ENCOUNTER — Encounter: Payer: Self-pay | Admitting: Gynecologic Oncology

## 2012-09-24 VITALS — BP 170/80 | HR 100 | Temp 98.6°F | Resp 22 | Ht 67.72 in | Wt 159.2 lb

## 2012-09-24 DIAGNOSIS — I10 Essential (primary) hypertension: Secondary | ICD-10-CM | POA: Insufficient documentation

## 2012-09-24 DIAGNOSIS — C549 Malignant neoplasm of corpus uteri, unspecified: Secondary | ICD-10-CM | POA: Insufficient documentation

## 2012-09-24 DIAGNOSIS — J45909 Unspecified asthma, uncomplicated: Secondary | ICD-10-CM | POA: Insufficient documentation

## 2012-09-24 DIAGNOSIS — C541 Malignant neoplasm of endometrium: Secondary | ICD-10-CM

## 2012-09-24 NOTE — Progress Notes (Signed)
Consult Note: Gyn-Onc  Christina Griffith 62 y.o. female  CC:  Chief Complaint  Patient presents with  . Endo ca    New patient    HPI: Patient is seen today in consultation at the request of Dr. Konrad Dolores for new diagnosis of endometrial cancer.  Patient is a 62 year old gravida 0 who's been menopausal since the age of 72. She's been on hormone replacement since that time. Previously she was on Prempro. Approximately 3 years ago she was changed Prometrium with the withdrawal bleed of 12 days every 3 months. Her Prometrium dose was decreased from 200-100. In October 2013 she began noticing some red slight spotting that occasionally would be heavier like a period. This would last for a few days. There was an associated cramping with it. This caught her attention as it was at a different time than her usual withdrawal bleed with her Prometrium. She saw Dr. Jennette Kettle and underwent a hysteroscopy D&C. This occurred on January 24. Pathology revealed a grade 1 endometrioid adenocarcinoma involving an endometrial polyp.  In addition she said some stress urinary incontinence and has been seeing Dr. Jacquelyne Balint for evaluation of this. She been to 8 weeks of pelvic for physical therapy which is helped significantly.  Review of Systems: She currently is continuing on her estradiol. She does have some mild IBS type symptoms with primarily constipation. She takes Metamucil daily for this which helped significantly. She has any chest pain shortness of breath. She does exercise on a treadmill about 45 minutes 3 times a week. She's also been doing her pelvic floor physical therapy. She denies any nausea vomiting any abdominal or pelvic pain. 10 point review of systems is otherwise negative. She had a mammogram and is up-to-date on those in January of this year. Should a colonoscopy the age of 73. She had 5 polyps 3 with a precancerous. She's due for repeat colonoscopy at the age of 64.  Current Meds:  Outpatient  Encounter Prescriptions as of 09/24/2012  Medication Sig Dispense Refill  . albuterol (PROVENTIL HFA;VENTOLIN HFA) 108 (90 BASE) MCG/ACT inhaler Inhale 2 puffs into the lungs every 6 (six) hours as needed.  6.7 g  1  . atorvastatin (LIPITOR) 10 MG tablet TAKE 1 TABLET BY MOUTH ONCE A DAY  30 tablet  11  . budesonide (RHINOCORT AQUA) 32 MCG/ACT nasal spray Place 1 spray into the nose daily as needed.      . Calcium Carbonate-Vitamin D (CALCIUM 600+D HIGH POTENCY) 600-400 MG-UNIT per tablet Take 1 tablet by mouth daily.      Marland Kitchen estradiol (VIVELLE-DOT) 0.1 MG/24HR Place 1 patch onto the skin 2 (two) times a week.      . fexofenadine (ALLEGRA) 180 MG tablet Take 180 mg by mouth daily as needed.      . hydrochlorothiazide (HYDRODIURIL) 25 MG tablet TAKE 1 TABLET BY MOUTH ONCE A DAY  30 tablet  6  . Magnesium 250 MG TABS Take 2 tablets by mouth daily.       . Multiple Vitamin (MULTIVITAMIN) tablet Take 1 tablet by mouth daily.      . potassium gluconate 595 MG TABS Take 595 mg by mouth daily.      Marland Kitchen PREMARIN 0.625 MG tablet Take 1 tablet by mouth Daily.      . progesterone (PROMETRIUM) 200 MG capsule Take 12 days every 3 months       No facility-administered encounter medications on file as of 09/24/2012.    Allergy: No Known Allergies  Social Hx:   History   Social History  . Marital Status: Married    Spouse Name: N/A    Number of Children: N/A  . Years of Education: N/A   Occupational History  . nurse practioner    Social History Main Topics  . Smoking status: Never Smoker   . Smokeless tobacco: Not on file  . Alcohol Use: Yes     Comment: 3 x a week  . Drug Use: No  . Sexually Active: Not on file   Other Topics Concern  . Not on file   Social History Narrative   Regular exercise- yes, 5-6 days a week, yoga   Diet: healthy    Past Surgical Hx:  Past Surgical History  Procedure Laterality Date  . Total hip arthroplasty      right 2002 left 2004  . Breast surgery       breast biopsy  . Hysteroscopy w/d&c Left 1/24/204    w/ resection of Endometrial polyps    Past Medical Hx:  Past Medical History  Diagnosis Date  . Endometrial ca   . Arthritis   . Asthma   . Hypertension     Family Hx: She has a maternal first cousin with breast cancer diagnosed in her 30s. She's a paternal first cousin with colon cancer diagnosed in her 15s. Maternal grandmother with colon cancer. Family History  Problem Relation Age of Onset  . Hypertension Mother   . Cancer Father     bladder and prostate  . Hyperlipidemia Father   . Cancer Maternal Grandmother     colon  . Breast cancer Cousin     Vitals:  Blood pressure 170/80, pulse 100, temperature 98.6 F (37 C), resp. rate 22, height 5' 7.72" (1.72 m), weight 159 lb 3.2 oz (72.213 kg).  Physical Exam: Well-nourished well-developed female in no acute distress.  Neck: Supple, nontender, nondistended. There is no lymphadenopathy.  Lungs: Clear to auscultation bilaterally.  Cardiovascular: regular rate and rhythm  Abdomen: Soft, nontender, nondistended, there are no palpable masses or hepatosplenomegaly. There is no rebound or guarding.  Groins: No lymphadenopathy.  Extremities: No edema.  Pelvic: Normal external female genitalia. Vagina slightly atrophic. The cervix is visualized. There's a physiologic discharge. There's no visible lesions. Bimanual examination reveals the cervix to be palpably normal. The corpus is of normal size shape and consistency. There is no adnexal masses.   Assessment/Plan:  62 year old gravida 0 with a clinical stage I grade 1 endometrioid adenocarcinoma. I discussed with her that definitive treatment would involve a hysterectomy with removal the uterus, cervix, bilateral tubes and ovaries. She is amenable to this. We discussed additional laparoscopy versus robotic surgery versus laparotomy. After discussion we'll proceed with a robotic assisted total hysterectomy bilateral  salpingo-oophorectomy. She understands that the uterus will be sent for frozen section. Pelvic lymphadenectomy will begin while the uterus is a frozen section. If the cancer involves less than percent 50% of the myometrium we will stop the lymphadenectomy. If there's greater than 50% involvement we'll need to proceed with not only pelvic but also para-aortic lymphadenectomy.  Risks of the procedure including but not limited to bleeding infection injury to surrounding organs and thromboembolic disease were discussed with the patient. Her questions regarding this were elicited in answer to her satisfaction.  She wishes to proceed with surgery on March 4. She will take her Prometrium withdrawal bleed prior that time. We did discuss postoperative hormone replacement therapy and we will in part defer this  decision until her pathology is returned. She is very pleased at today's consultation. She'll contact me should any questions. She'll be scheduled for preop visit   Juliyah Mergen A., MD 09/24/2012, 9:42 AM

## 2012-09-24 NOTE — Patient Instructions (Signed)
Return for pre-op visit

## 2012-09-30 ENCOUNTER — Encounter (HOSPITAL_COMMUNITY): Payer: Self-pay | Admitting: Pharmacy Technician

## 2012-09-30 ENCOUNTER — Ambulatory Visit: Payer: BC Managed Care – PPO | Admitting: Gynecologic Oncology

## 2012-09-30 NOTE — Progress Notes (Signed)
NEED PRE OP ORDERS IN EPIC PLEASE-  PST appt 2/28  Thanks

## 2012-10-03 ENCOUNTER — Encounter (HOSPITAL_COMMUNITY)
Admission: RE | Admit: 2012-10-03 | Discharge: 2012-10-03 | Disposition: A | Discharge status: Home / Self Care | Payer: BC Managed Care – PPO | Source: Ambulatory Visit | Attending: Gynecologic Oncology | Admitting: Gynecologic Oncology

## 2012-10-03 ENCOUNTER — Ambulatory Visit (HOSPITAL_COMMUNITY)
Admission: RE | Admit: 2012-10-03 | Discharge: 2012-10-03 | Disposition: A | Discharge status: Home / Self Care | Payer: BC Managed Care – PPO | Source: Ambulatory Visit | Attending: Gynecologic Oncology | Admitting: Gynecologic Oncology

## 2012-10-03 ENCOUNTER — Encounter (HOSPITAL_COMMUNITY): Payer: Self-pay

## 2012-10-03 DIAGNOSIS — Z01812 Encounter for preprocedural laboratory examination: Secondary | ICD-10-CM | POA: Insufficient documentation

## 2012-10-03 DIAGNOSIS — IMO0001 Reserved for inherently not codable concepts without codable children: Secondary | ICD-10-CM

## 2012-10-03 DIAGNOSIS — C549 Malignant neoplasm of corpus uteri, unspecified: Secondary | ICD-10-CM | POA: Insufficient documentation

## 2012-10-03 DIAGNOSIS — I10 Essential (primary) hypertension: Secondary | ICD-10-CM | POA: Insufficient documentation

## 2012-10-03 HISTORY — DX: Reserved for inherently not codable concepts without codable children: IMO0001

## 2012-10-03 HISTORY — DX: Personal history of other diseases of urinary system: Z87.448

## 2012-10-03 LAB — CBC WITH DIFFERENTIAL/PLATELET
Basophils Absolute: 0 10*3/uL (ref 0.0–0.1)
Basophils Relative: 0 % (ref 0–1)
Eosinophils Absolute: 0.1 10*3/uL (ref 0.0–0.7)
Eosinophils Relative: 1 % (ref 0–5)
HCT: 40.2 % (ref 36.0–46.0)
Hemoglobin: 12.7 g/dL (ref 12.0–15.0)
Lymphocytes Relative: 26 % (ref 12–46)
Lymphs Abs: 1.9 10*3/uL (ref 0.7–4.0)
MCH: 28.2 pg (ref 26.0–34.0)
MCHC: 31.6 g/dL (ref 30.0–36.0)
MCV: 89.1 fL (ref 78.0–100.0)
Monocytes Absolute: 0.4 10*3/uL (ref 0.1–1.0)
Monocytes Relative: 6 % (ref 3–12)
Neutro Abs: 4.8 10*3/uL (ref 1.7–7.7)
Neutrophils Relative %: 67 % (ref 43–77)
Platelets: 286 10*3/uL (ref 150–400)
RBC: 4.51 MIL/uL (ref 3.87–5.11)
RDW: 14 % (ref 11.5–15.5)
WBC: 7.2 10*3/uL (ref 4.0–10.5)

## 2012-10-03 LAB — COMPREHENSIVE METABOLIC PANEL
ALT: 27 U/L (ref 0–35)
AST: 28 U/L (ref 0–37)
Albumin: 3.5 g/dL (ref 3.5–5.2)
Alkaline Phosphatase: 96 U/L (ref 39–117)
BUN: 11 mg/dL (ref 6–23)
CO2: 31 mEq/L (ref 19–32)
Calcium: 10.2 mg/dL (ref 8.4–10.5)
Chloride: 96 mEq/L (ref 96–112)
Creatinine, Ser: 0.65 mg/dL (ref 0.50–1.10)
GFR calc Af Amer: >90 mL/min (ref >90)
GFR calc non Af Amer: >90 mL/min (ref >90)
Glucose, Bld: 95 mg/dL (ref 70–99)
Potassium: 3.5 mEq/L (ref 3.5–5.1)
Sodium: 135 mEq/L (ref 135–145)
Total Bilirubin: 0.2 mg/dL — ABNORMAL LOW (ref 0.3–1.2)
Total Protein: 7.5 g/dL (ref 6.0–8.3)

## 2012-10-03 LAB — ABO/RH: ABO/RH(D): O POS

## 2012-10-03 LAB — SURGICAL PCR SCREEN
MRSA, PCR: INVALID — AB
Staphylococcus aureus: INVALID — AB

## 2012-10-03 NOTE — Pre-Procedure Instructions (Signed)
CXR, EKG WERE DONE TODAY - PREOP - AT [facility].

## 2012-10-03 NOTE — Patient Instructions (Signed)
YOUR SURGERY IS SCHEDULED AT [facility]  ON:  Tuesday 3/4  REPORT TO [facility] STAY [facility] AT:  5:15 AM      PHONE # FOR SHORT STAY IS [phone]   CLEAR LIQUID DIET DAY BEFORE YOUR SURGERY.  DO NOT EAT OR DRINK ANYTHING AFTER MIDNIGHT THE NIGHT BEFORE YOUR SURGERY.  YOU MAY BRUSH YOUR TEETH, RINSE OUT YOUR MOUTH--BUT NO WATER, NO FOOD, NO CHEWING GUM, NO MINTS, NO CANDIES, NO CHEWING TOBACCO.  PLEASE TAKE THE FOLLOWING MEDICATIONS THE AM OF YOUR SURGERY WITH A FEW SIPS OF WATER:  USE YOUR ALBUTEROL INHALER    IF YOU USE INHALERS--USE YOUR INHALERS THE AM OF YOUR SURGERY AND BRING INHALERS TO THE HOSPITAL.      DO NOT BRING VALUABLES, MONEY, CREDIT CARDS.  DO NOT WEAR JEWELRY, MAKE-UP, NAIL POLISH AND NO METAL PINS OR CLIPS IN YOUR HAIR. CONTACT LENS, DENTURES / PARTIALS, GLASSES SHOULD NOT BE WORN TO SURGERY AND IN MOST CASES-HEARING AIDS WILL NEED TO BE REMOVED.  BRING YOUR GLASSES CASE, ANY EQUIPMENT NEEDED FOR YOUR CONTACT LENS. FOR PATIENTS ADMITTED TO THE HOSPITAL--CHECK OUT TIME THE DAY OF DISCHARGE IS 11:00 AM.  ALL INPATIENT ROOMS ARE PRIVATE - WITH BATHROOM, TELEPHONE, TELEVISION AND WIFI INTERNET.  REMEMBER TO DO DEEP BREATHING, COUGHING, TURNING, LEG EXERCISES AFTER YOUR SURGERY.                             PLEASE READ OVER ANY  FACT SHEETS THAT YOU WERE GIVEN: MRSA INFORMATION, BLOOD TRANSFUSION INFORMATION FAILURE TO FOLLOW THESE INSTRUCTIONS MAY RESULT IN THE CANCELLATION OF YOUR SURGERY.   PATIENT SIGNATURE_________________________________

## 2012-10-05 LAB — MRSA CULTURE

## 2012-10-06 NOTE — Progress Notes (Signed)
Called pt and informed her of change in surgical time. Pt to arrive at 0700 on 10/07/12 for 0900 surgery time. She verbalizes understanding.

## 2012-10-07 ENCOUNTER — Encounter (HOSPITAL_COMMUNITY)
Admission: RE | Disposition: A | Discharge status: Home / Self Care | Payer: Self-pay | Source: Ambulatory Visit | Attending: Obstetrics & Gynecology

## 2012-10-07 ENCOUNTER — Encounter (HOSPITAL_COMMUNITY): Payer: Self-pay | Admitting: *Deleted

## 2012-10-07 ENCOUNTER — Ambulatory Visit (HOSPITAL_COMMUNITY): Payer: BC Managed Care – PPO | Admitting: Anesthesiology

## 2012-10-07 ENCOUNTER — Ambulatory Visit (HOSPITAL_COMMUNITY)
Admission: RE | Admit: 2012-10-07 | Discharge: 2012-10-08 | Disposition: A | Discharge status: Home / Self Care | Payer: BC Managed Care – PPO | Source: Ambulatory Visit | Attending: Obstetrics & Gynecology | Admitting: Obstetrics & Gynecology

## 2012-10-07 ENCOUNTER — Encounter (HOSPITAL_COMMUNITY): Payer: Self-pay | Admitting: Anesthesiology

## 2012-10-07 DIAGNOSIS — C541 Malignant neoplasm of endometrium: Secondary | ICD-10-CM

## 2012-10-07 DIAGNOSIS — K5900 Constipation, unspecified: Secondary | ICD-10-CM | POA: Insufficient documentation

## 2012-10-07 DIAGNOSIS — Z79890 Hormone replacement therapy (postmenopausal): Secondary | ICD-10-CM | POA: Insufficient documentation

## 2012-10-07 DIAGNOSIS — C549 Malignant neoplasm of corpus uteri, unspecified: Secondary | ICD-10-CM | POA: Insufficient documentation

## 2012-10-07 DIAGNOSIS — N9489 Other specified conditions associated with female genital organs and menstrual cycle: Secondary | ICD-10-CM | POA: Insufficient documentation

## 2012-10-07 DIAGNOSIS — J45909 Unspecified asthma, uncomplicated: Secondary | ICD-10-CM | POA: Insufficient documentation

## 2012-10-07 DIAGNOSIS — N393 Stress incontinence (female) (male): Secondary | ICD-10-CM | POA: Insufficient documentation

## 2012-10-07 DIAGNOSIS — I10 Essential (primary) hypertension: Secondary | ICD-10-CM | POA: Insufficient documentation

## 2012-10-07 DIAGNOSIS — Z8542 Personal history of malignant neoplasm of other parts of uterus: Secondary | ICD-10-CM | POA: Diagnosis present

## 2012-10-07 DIAGNOSIS — Z96649 Presence of unspecified artificial hip joint: Secondary | ICD-10-CM | POA: Insufficient documentation

## 2012-10-07 DIAGNOSIS — M129 Arthropathy, unspecified: Secondary | ICD-10-CM | POA: Insufficient documentation

## 2012-10-07 DIAGNOSIS — Z79899 Other long term (current) drug therapy: Secondary | ICD-10-CM | POA: Insufficient documentation

## 2012-10-07 HISTORY — PX: ROBOTIC ASSISTED TOTAL HYSTERECTOMY WITH BILATERAL SALPINGO OOPHERECTOMY: SHX6086

## 2012-10-07 LAB — TYPE AND SCREEN
ABO/RH(D): O POS
Antibody Screen: NEGATIVE

## 2012-10-07 SURGERY — ROBOTIC ASSISTED TOTAL HYSTERECTOMY WITH BILATERAL SALPINGO OOPHORECTOMY
Anesthesia: General | Wound class: Clean Contaminated

## 2012-10-07 MED ORDER — ONDANSETRON HCL 4 MG PO TABS: 4.0000 mg | ORAL_TABLET | Freq: Four times a day (QID) | ORAL | Status: DC | PRN

## 2012-10-07 MED ORDER — ACETAMINOPHEN 10 MG/ML IV SOLN
INTRAVENOUS | Status: AC
  Filled 2012-10-07: qty 100

## 2012-10-07 MED ORDER — PROMETHAZINE HCL 25 MG/ML IJ SOLN
6.2500 mg | INTRAMUSCULAR | Status: DC | PRN
  Administered 2012-10-07: 6.25 mg via INTRAVENOUS

## 2012-10-07 MED ORDER — KETOROLAC TROMETHAMINE 30 MG/ML IJ SOLN
30.0000 mg | Freq: Four times a day (QID) | INTRAMUSCULAR | Status: DC
  Administered 2012-10-07 – 2012-10-08 (×3): 30 mg via INTRAVENOUS
  Filled 2012-10-07 (×7): qty 1

## 2012-10-07 MED ORDER — LACTATED RINGERS IV SOLN
INTRAVENOUS | Status: DC | PRN
  Administered 2012-10-07 (×2): via INTRAVENOUS

## 2012-10-07 MED ORDER — PROPOFOL 10 MG/ML IV BOLUS
INTRAVENOUS | Status: DC | PRN
  Administered 2012-10-07: 180 mg via INTRAVENOUS

## 2012-10-07 MED ORDER — KETOROLAC TROMETHAMINE 30 MG/ML IJ SOLN
30.0000 mg | Freq: Four times a day (QID) | INTRAMUSCULAR | Status: DC
  Filled 2012-10-07 (×4): qty 1

## 2012-10-07 MED ORDER — STERILE WATER FOR IRRIGATION IR SOLN
Status: DC | PRN
  Administered 2012-10-07: 3000 mL

## 2012-10-07 MED ORDER — ROCURONIUM BROMIDE 100 MG/10ML IV SOLN
INTRAVENOUS | Status: DC | PRN
  Administered 2012-10-07: 70 mg via INTRAVENOUS
  Administered 2012-10-07: 5 mg via INTRAVENOUS

## 2012-10-07 MED ORDER — ACETAMINOPHEN 10 MG/ML IV SOLN
INTRAVENOUS | Status: DC | PRN
  Administered 2012-10-07: 1000 mg via INTRAVENOUS

## 2012-10-07 MED ORDER — LABETALOL HCL 5 MG/ML IV SOLN
INTRAVENOUS | Status: DC | PRN
  Administered 2012-10-07: 5 mg via INTRAVENOUS

## 2012-10-07 MED ORDER — ALBUTEROL SULFATE HFA 108 (90 BASE) MCG/ACT IN AERS
2.0000 | INHALATION_SPRAY | Freq: Four times a day (QID) | RESPIRATORY_TRACT | Status: DC | PRN
  Filled 2012-10-07: qty 6.7

## 2012-10-07 MED ORDER — LACTATED RINGERS IR SOLN
Status: DC | PRN
  Administered 2012-10-07: 1000 mL

## 2012-10-07 MED ORDER — PROMETHAZINE HCL 25 MG/ML IJ SOLN
INTRAMUSCULAR | Status: AC
  Filled 2012-10-07: qty 1

## 2012-10-07 MED ORDER — ONDANSETRON HCL 4 MG/2ML IJ SOLN: 4.0000 mg | Freq: Four times a day (QID) | INTRAMUSCULAR | Status: DC | PRN

## 2012-10-07 MED ORDER — KETOROLAC TROMETHAMINE 30 MG/ML IJ SOLN: 15.0000 mg | Freq: Once | INTRAMUSCULAR | Status: DC | PRN

## 2012-10-07 MED ORDER — NEOSTIGMINE METHYLSULFATE 1 MG/ML IJ SOLN
INTRAMUSCULAR | Status: DC | PRN
  Administered 2012-10-07: 5 mg via INTRAVENOUS

## 2012-10-07 MED ORDER — CEFAZOLIN SODIUM-DEXTROSE 2-3 GM-% IV SOLR
2.0000 g | INTRAVENOUS | Status: AC
  Administered 2012-10-07: 2 g via INTRAVENOUS

## 2012-10-07 MED ORDER — GLYCOPYRROLATE 0.2 MG/ML IJ SOLN
INTRAMUSCULAR | Status: DC | PRN
  Administered 2012-10-07: .8 mg via INTRAVENOUS

## 2012-10-07 MED ORDER — KCL IN DEXTROSE-NACL 20-5-0.45 MEQ/L-%-% IV SOLN
INTRAVENOUS | Status: DC
  Administered 2012-10-07: 14:00:00 via INTRAVENOUS
  Administered 2012-10-07 – 2012-10-08 (×2): 125 mL/h via INTRAVENOUS
  Filled 2012-10-07 (×4): qty 1000

## 2012-10-07 MED ORDER — ONDANSETRON HCL 4 MG/2ML IJ SOLN
INTRAMUSCULAR | Status: DC | PRN
  Administered 2012-10-07: 4 mg via INTRAVENOUS

## 2012-10-07 MED ORDER — ENOXAPARIN SODIUM 40 MG/0.4ML ~~LOC~~ SOLN
40.0000 mg | SUBCUTANEOUS | Status: DC
  Administered 2012-10-08: 40 mg via SUBCUTANEOUS
  Filled 2012-10-07: qty 0.4

## 2012-10-07 MED ORDER — HYDROMORPHONE HCL PF 1 MG/ML IJ SOLN: 0.2500 mg | INTRAMUSCULAR | Status: DC | PRN

## 2012-10-07 MED ORDER — HYDROMORPHONE HCL PF 1 MG/ML IJ SOLN
INTRAMUSCULAR | Status: DC | PRN
  Administered 2012-10-07 (×2): 1 mg via INTRAVENOUS

## 2012-10-07 MED ORDER — SUFENTANIL CITRATE 50 MCG/ML IV SOLN
INTRAVENOUS | Status: DC | PRN
  Administered 2012-10-07 (×4): 10 ug via INTRAVENOUS
  Administered 2012-10-07: 5 ug via INTRAVENOUS
  Administered 2012-10-07: 20 ug via INTRAVENOUS
  Administered 2012-10-07: 10 ug via INTRAVENOUS

## 2012-10-07 MED ORDER — ZOLPIDEM TARTRATE 5 MG PO TABS: 5.0000 mg | ORAL_TABLET | Freq: Every evening | ORAL | Status: DC | PRN

## 2012-10-07 MED ORDER — LACTATED RINGERS IV SOLN: INTRAVENOUS | Status: DC

## 2012-10-07 MED ORDER — OXYCODONE-ACETAMINOPHEN 5-325 MG PO TABS: 1.0000 | ORAL_TABLET | ORAL | Status: DC | PRN

## 2012-10-07 MED ORDER — MORPHINE SULFATE 2 MG/ML IJ SOLN
2.0000 mg | INTRAMUSCULAR | Status: DC | PRN
  Administered 2012-10-07 – 2012-10-08 (×3): 2 mg via INTRAVENOUS
  Filled 2012-10-07 (×3): qty 1

## 2012-10-07 MED ORDER — LIDOCAINE HCL (CARDIAC) 20 MG/ML IV SOLN
INTRAVENOUS | Status: DC | PRN
  Administered 2012-10-07: 60 mg via INTRAVENOUS

## 2012-10-07 MED ORDER — CEFAZOLIN SODIUM-DEXTROSE 2-3 GM-% IV SOLR
INTRAVENOUS | Status: AC
  Filled 2012-10-07: qty 50

## 2012-10-07 MED ORDER — MIDAZOLAM HCL 5 MG/5ML IJ SOLN
INTRAMUSCULAR | Status: DC | PRN
  Administered 2012-10-07 (×2): 2 mg via INTRAVENOUS

## 2012-10-07 MED ORDER — DEXAMETHASONE SODIUM PHOSPHATE 10 MG/ML IJ SOLN
INTRAMUSCULAR | Status: DC | PRN
  Administered 2012-10-07: 10 mg via INTRAVENOUS

## 2012-10-07 SURGICAL SUPPLY — 49 items
BENZOIN TINCTURE PRP APPL 2/3 (GAUZE/BANDAGES/DRESSINGS) ×2 IMPLANT
CHLORAPREP W/TINT 26ML (MISCELLANEOUS) ×2 IMPLANT
CLOTH BEACON ORANGE TIMEOUT ST (SAFETY) ×2 IMPLANT
CORD HIGH FREQUENCY UNIPOLAR (ELECTROSURGICAL) ×2 IMPLANT
CORDS BIPOLAR (ELECTRODE) ×2 IMPLANT
COVER MAYO STAND STRL (DRAPES) ×2 IMPLANT
COVER SURGICAL LIGHT HANDLE (MISCELLANEOUS) ×2 IMPLANT
COVER TIP SHEARS 8 DVNC (MISCELLANEOUS) ×1 IMPLANT
COVER TIP SHEARS 8MM DA VINCI (MISCELLANEOUS) ×1
DECANTER SPIKE VIAL GLASS SM (MISCELLANEOUS) IMPLANT
DRAPE LG THREE QUARTER DISP (DRAPES) ×4 IMPLANT
DRAPE SURG IRRIG POUCH 19X23 (DRAPES) ×2 IMPLANT
DRAPE TABLE BACK 44X90 PK DISP (DRAPES) ×2 IMPLANT
DRAPE UTILITY XL STRL (DRAPES) ×2 IMPLANT
DRAPE WARM FLUID 44X44 (DRAPE) ×2 IMPLANT
DRSG TEGADERM 6X8 (GAUZE/BANDAGES/DRESSINGS) ×4 IMPLANT
ELECT REM PT RETURN 9FT ADLT (ELECTROSURGICAL) ×2
ELECTRODE REM PT RTRN 9FT ADLT (ELECTROSURGICAL) ×1 IMPLANT
GAUZE VASELINE 3X9 (GAUZE/BANDAGES/DRESSINGS) IMPLANT
GLOVE BIO SURGEON STRL SZ 6.5 (GLOVE) ×8 IMPLANT
GLOVE BIO SURGEON STRL SZ7.5 (GLOVE) ×2 IMPLANT
GLOVE BIOGEL PI IND STRL 7.0 (GLOVE) ×2 IMPLANT
GLOVE BIOGEL PI INDICATOR 7.0 (GLOVE) ×2
GOWN PREVENTION PLUS XLARGE (GOWN DISPOSABLE) ×6 IMPLANT
HOLDER FOLEY CATH W/STRAP (MISCELLANEOUS) ×2 IMPLANT
KIT ACCESSORY DA VINCI DISP (KITS) ×1
KIT ACCESSORY DVNC DISP (KITS) ×1 IMPLANT
MANIPULATOR UTERINE 4.5 ZUMI (MISCELLANEOUS) ×2 IMPLANT
OCCLUDER COLPOPNEUMO (BALLOONS) ×2 IMPLANT
PACK LAPAROSCOPY W LONG (CUSTOM PROCEDURE TRAY) ×2 IMPLANT
POUCH SPECIMEN RETRIEVAL 10MM (ENDOMECHANICALS) ×4 IMPLANT
SET TUBE IRRIG SUCTION NO TIP (IRRIGATION / IRRIGATOR) ×2 IMPLANT
SHEET LAVH (DRAPES) ×2 IMPLANT
SOLUTION ELECTROLUBE (MISCELLANEOUS) ×2 IMPLANT
SPONGE LAP 18X18 X RAY DECT (DISPOSABLE) IMPLANT
STRIP CLOSURE SKIN 1/2X4 (GAUZE/BANDAGES/DRESSINGS) ×2 IMPLANT
SUT VIC AB 0 CT1 27 (SUTURE) ×3
SUT VIC AB 0 CT1 27XBRD ANTBC (SUTURE) ×3 IMPLANT
SUT VIC AB 4-0 PS2 27 (SUTURE) ×4 IMPLANT
SUT VICRYL 0 UR6 27IN ABS (SUTURE) ×2 IMPLANT
SYR 50ML LL SCALE MARK (SYRINGE) ×2 IMPLANT
SYR BULB IRRIGATION 50ML (SYRINGE) IMPLANT
TOWEL OR 17X26 10 PK STRL BLUE (TOWEL DISPOSABLE) ×2 IMPLANT
TRAP SPECIMEN MUCOUS 40CC (MISCELLANEOUS) IMPLANT
TRAY FOLEY CATH 14FRSI W/METER (CATHETERS) ×2 IMPLANT
TROCAR 12M 150ML BLUNT (TROCAR) ×2 IMPLANT
TROCAR XCEL 12X100 BLDLESS (ENDOMECHANICALS) ×2 IMPLANT
TROCAR XCEL BLADELESS 5X75MML (TROCAR) ×2 IMPLANT
WATER STERILE IRR 1500ML POUR (IV SOLUTION) IMPLANT

## 2012-10-07 NOTE — Interval H&P Note (Signed)
History and Physical Interval Note:  10/07/2012 9:13 AM  Christina Griffith  has presented today for surgery, with the diagnosis of Endometrial Cancer  The various methods of treatment have been discussed with the patient and family. After consideration of risks, benefits and other options for treatment, the patient has consented to  Procedure(s): ROBOTIC ASSISTED TOTAL HYSTERECTOMY WITH BILATERAL SALPINGO OOPHORECTOMY/POSSIBLE LYMPH NODE BIOPSY (N/A) as a surgical intervention .  The patient's history has been reviewed, patient examined, no change in status, stable for surgery.  I have reviewed the patient's chart and labs.  Questions were answered to the patient's satisfaction.     Mikah Rottinghaus A.

## 2012-10-07 NOTE — Anesthesia Postprocedure Evaluation (Signed)
  Anesthesia Post-op Note  Patient: Christina Griffith  Procedure(s) Performed: Procedure(s) (LRB): ROBOTIC ASSISTED TOTAL HYSTERECTOMY WITH BILATERAL SALPINGO OOPHORECTOMY/POSSIBLE LYMPH NODE BIOPSY (N/A)  Patient Location: PACU  Anesthesia Type: General  Level of Consciousness: awake and alert   Airway and Oxygen Therapy: Patient Spontanous Breathing  Post-op Pain: mild  Post-op Assessment: Post-op Vital signs reviewed, Patient's Cardiovascular Status Stable, Respiratory Function Stable, Patent Airway and No signs of Nausea or vomiting  Last Vitals:  Filed Vitals:   10/07/12 1348  BP: 108/67  Pulse: 89  Temp: 36.3 C  Resp: 18    Post-op Vital Signs: stable   Complications: No apparent anesthesia complications

## 2012-10-07 NOTE — Anesthesia Preprocedure Evaluation (Addendum)
Anesthesia Evaluation  Patient identified by MRN, date of birth, ID band Patient awake    Reviewed: Allergy & Precautions, H&P , NPO status , Patient's Chart, lab work & pertinent test results  Airway Mallampati: II TM Distance: >3 FB Neck ROM: Full    Dental no notable dental hx.    Pulmonary neg pulmonary ROS,  breath sounds clear to auscultation  Pulmonary exam normal       Cardiovascular hypertension, Rhythm:Regular Rate:Normal     Neuro/Psych negative neurological ROS  negative psych ROS   GI/Hepatic negative GI ROS, Neg liver ROS,   Endo/Other  negative endocrine ROS  Renal/GU negative Renal ROS  negative genitourinary   Musculoskeletal negative musculoskeletal ROS (+)   Abdominal   Peds negative pediatric ROS (+)  Hematology negative hematology ROS (+)   Anesthesia Other Findings   Reproductive/Obstetrics negative OB ROS                           Anesthesia Physical Anesthesia Plan  ASA: II  Anesthesia Plan: General   Post-op Pain Management:    Induction: Intravenous  Airway Management Planned: Oral ETT  Additional Equipment:   Intra-op Plan:   Post-operative Plan: Extubation in OR  Informed Consent: I have reviewed the patients History and Physical, chart, labs and discussed the procedure including the risks, benefits and alternatives for the proposed anesthesia with the patient or authorized representative who has indicated his/her understanding and acceptance.   Dental advisory given  Plan Discussed with: CRNA and Surgeon  Anesthesia Plan Comments:         Anesthesia Quick Evaluation

## 2012-10-07 NOTE — H&P (View-Only) (Signed)
Consult Note: Gyn-Onc  Christina Griffith 62 y.o. female  CC:  Chief Complaint  Patient presents with  . Endo ca    New patient    HPI: Patient is seen today in consultation at the request of Dr. Ron Neal for new diagnosis of endometrial cancer.  Patient is a 62-year-old gravida 0 who's been menopausal since the age of 49. She's been on hormone replacement since that time. Previously she was on Prempro. Approximately 3 years ago she was changed Prometrium with the withdrawal bleed of 12 days every 3 months. Her Prometrium dose was decreased from 200-100. In October 2013 she began noticing some red slight spotting that occasionally would be heavier like a period. This would last for a few days. There was an associated cramping with it. This caught her attention as it was at a different time than her usual withdrawal bleed with her Prometrium. She saw Dr. Neal and underwent a hysteroscopy D&C. This occurred on January 24. Pathology revealed a grade 1 endometrioid adenocarcinoma involving an endometrial polyp.  In addition she said some stress urinary incontinence and has been seeing Dr. McDermott for evaluation of this. She been to 8 weeks of pelvic for physical therapy which is helped significantly.  Review of Systems: She currently is continuing on her estradiol. She does have some mild IBS type symptoms with primarily constipation. She takes Metamucil daily for this which helped significantly. She has any chest pain shortness of breath. She does exercise on a treadmill about 45 minutes 3 times a week. She's also been doing her pelvic floor physical therapy. She denies any nausea vomiting any abdominal or pelvic pain. 10 point review of systems is otherwise negative. She had a mammogram and is up-to-date on those in January of this year. Should a colonoscopy the age of 59. She had 5 polyps 3 with a precancerous. She's due for repeat colonoscopy at the age of 64.  Current Meds:  Outpatient  Encounter Prescriptions as of 09/24/2012  Medication Sig Dispense Refill  . albuterol (PROVENTIL HFA;VENTOLIN HFA) 108 (90 BASE) MCG/ACT inhaler Inhale 2 puffs into the lungs every 6 (six) hours as needed.  6.7 g  1  . atorvastatin (LIPITOR) 10 MG tablet TAKE 1 TABLET BY MOUTH ONCE A DAY  30 tablet  11  . budesonide (RHINOCORT AQUA) 32 MCG/ACT nasal spray Place 1 spray into the nose daily as needed.      . Calcium Carbonate-Vitamin D (CALCIUM 600+D HIGH POTENCY) 600-400 MG-UNIT per tablet Take 1 tablet by mouth daily.      . estradiol (VIVELLE-DOT) 0.1 MG/24HR Place 1 patch onto the skin 2 (two) times a week.      . fexofenadine (ALLEGRA) 180 MG tablet Take 180 mg by mouth daily as needed.      . hydrochlorothiazide (HYDRODIURIL) 25 MG tablet TAKE 1 TABLET BY MOUTH ONCE A DAY  30 tablet  6  . Magnesium 250 MG TABS Take 2 tablets by mouth daily.       . Multiple Vitamin (MULTIVITAMIN) tablet Take 1 tablet by mouth daily.      . potassium gluconate 595 MG TABS Take 595 mg by mouth daily.      . PREMARIN 0.625 MG tablet Take 1 tablet by mouth Daily.      . progesterone (PROMETRIUM) 200 MG capsule Take 12 days every 3 months       No facility-administered encounter medications on file as of 09/24/2012.    Allergy: No Known Allergies    Social Hx:   History   Social History  . Marital Status: Married    Spouse Name: N/A    Number of Children: N/A  . Years of Education: N/A   Occupational History  . nurse practioner    Social History Main Topics  . Smoking status: Never Smoker   . Smokeless tobacco: Not on file  . Alcohol Use: Yes     Comment: 3 x a week  . Drug Use: No  . Sexually Active: Not on file   Other Topics Concern  . Not on file   Social History Narrative   Regular exercise- yes, 5-6 days a week, yoga   Diet: healthy    Past Surgical Hx:  Past Surgical History  Procedure Laterality Date  . Total hip arthroplasty      right 2002 left 2004  . Breast surgery       breast biopsy  . Hysteroscopy w/d&c Left 1/24/204    w/ resection of Endometrial polyps    Past Medical Hx:  Past Medical History  Diagnosis Date  . Endometrial ca   . Arthritis   . Asthma   . Hypertension     Family Hx: She has a maternal first cousin with breast cancer diagnosed in her 30s. She's a paternal first cousin with colon cancer diagnosed in her 40s. Maternal grandmother with colon cancer. Family History  Problem Relation Age of Onset  . Hypertension Mother   . Cancer Father     bladder and prostate  . Hyperlipidemia Father   . Cancer Maternal Grandmother     colon  . Breast cancer Cousin     Vitals:  Blood pressure 170/80, pulse 100, temperature 98.6 F (37 C), resp. rate 22, height 5' 7.72" (1.72 m), weight 159 lb 3.2 oz (72.213 kg).  Physical Exam: Well-nourished well-developed female in no acute distress.  Neck: Supple, nontender, nondistended. There is no lymphadenopathy.  Lungs: Clear to auscultation bilaterally.  Cardiovascular: regular rate and rhythm  Abdomen: Soft, nontender, nondistended, there are no palpable masses or hepatosplenomegaly. There is no rebound or guarding.  Groins: No lymphadenopathy.  Extremities: No edema.  Pelvic: Normal external female genitalia. Vagina slightly atrophic. The cervix is visualized. There's a physiologic discharge. There's no visible lesions. Bimanual examination reveals the cervix to be palpably normal. The corpus is of normal size shape and consistency. There is no adnexal masses.   Assessment/Plan:  62-year-old gravida 0 with a clinical stage I grade 1 endometrioid adenocarcinoma. I discussed with her that definitive treatment would involve a hysterectomy with removal the uterus, cervix, bilateral tubes and ovaries. She is amenable to this. We discussed additional laparoscopy versus robotic surgery versus laparotomy. After discussion we'll proceed with a robotic assisted total hysterectomy bilateral  salpingo-oophorectomy. She understands that the uterus will be sent for frozen section. Pelvic lymphadenectomy will begin while the uterus is a frozen section. If the cancer involves less than percent 50% of the myometrium we will stop the lymphadenectomy. If there's greater than 50% involvement we'll need to proceed with not only pelvic but also para-aortic lymphadenectomy.  Risks of the procedure including but not limited to bleeding infection injury to surrounding organs and thromboembolic disease were discussed with the patient. Her questions regarding this were elicited in answer to her satisfaction.  She wishes to proceed with surgery on March 4. She will take her Prometrium withdrawal bleed prior that time. We did discuss postoperative hormone replacement therapy and we will in part defer this   decision until her pathology is returned. She is very pleased at today's consultation. She'll contact me should any questions. She'll be scheduled for preop visit   Berneita Sanagustin A., MD 09/24/2012, 9:42 AM  

## 2012-10-07 NOTE — Transfer of Care (Signed)
Immediate Anesthesia Transfer of Care Note  Patient: Christina Griffith  Procedure(s) Performed: Procedure(s): ROBOTIC ASSISTED TOTAL HYSTERECTOMY WITH BILATERAL SALPINGO OOPHORECTOMY/POSSIBLE LYMPH NODE BIOPSY (N/A)  Patient Location: PACU  Anesthesia Type:General  Level of Consciousness: awake, alert , oriented and patient cooperative  Airway & Oxygen Therapy: Patient Spontanous Breathing and Patient connected to face mask oxygen  Post-op Assessment: Report given to PACU RN, Post -op Vital signs reviewed and stable and Patient moving all extremities X 4  Post vital signs: stable  Complications: No apparent anesthesia complications

## 2012-10-07 NOTE — Op Note (Signed)
PATIENT: Christina Griffith DATE OF BIRTH: May 26, 1951 ENCOUNTER DATE: 10/07/12   Preop Diagnosis: grade 1 endometrioid adenocarcinoma.   Postoperative Diagnosis: same.   Surgery: Total robotic hysterectomy bilateral salpingo-oophorectomy, right pelvic lymph node dissection, left pelvic lymph node sampling   Surgeons:  Shan Valdes A. Duard Brady, MD; Antionette Char, MD   Assistant: Telford Nab   Anesthesia: General   Estimated blood loss: <50 ml   IVF: 1100 ml   Urine output: 500 ml   Complications: None   Pathology: Uterus, cervix, bilateral tubes and ovaries, right pelvic lymph nodes and left pelvic nodes to pathology.   Operative findings: Normal size uterus and normal adnexa.  Adhesive disease of the rectosigmoid colon to the bilateral adnexa. Ileum adherent to right pelvic sidewall.  Frozen section revealed a grade 1 endometrial adenocarcinoma with minimal to no myometrial invasion.   Procedure: The patient was identified in the preoperative holding area. Informed consent was signed on the chart. Patient was seen history was reviewed and exam was performed.   The patient was then taken to the operating room and placed in the supine position with SCD hose on.  Her arms were tucked at her side with appropriate precautions on the gel pad to her comfort. General anesthesia was then induced without difficulty. She was then placed in the dorsolithotomy position.Shoulder blocks were then placed in the usual fashion with appropriate precautions. A OG-tube was placed to suction. First timeout was performed to confirm the patient, procedure, antibiotic, allergy status, estimated blood loss, and OR time. The perineum was then prepped in the usual fashion with Betadine. A 14 French Foley was inserted into the bladder under sterile conditions. A sterile speculum was placed in the vagina. The cervix was without lesions. The cervix was grasped with a single-tooth tenaculum. The dilator without  difficulty. A ZUMI with a small Koe ring was placed without difficulty. The abdomen was then prepped with 1 Chlor prep sponges per protocol.  Patient was then draped after the prep was dried.   Second timeout was performed to confirm the above. After again confirming OG tube placement and it was to suction. A stab-wound was made in left upper quadrant 2 cm below the costal margin on the left in the midclavicular line. A 5 mm operative report was used to ensure intra-abdominal placement. The abdomen was insufflated. At this point all points during the procedure the patient's intra-abdominal pressure was not increased over 15 mm of mercury. After insufflation was complete, the patient was placed in deep Trendelenburg position. 25 cm above the pubic symphysis that area was marked the camera port. Bilateral robotic ports were marked 10 cm from the midline incision at approximately 5 angle. Under direct visualization each of the trochars was placed into the abdomen. The small bowel was folded on its mesentery to allow visualization to the pelvis. The 5 mm LUQ port was then converted to a 10/12 port under direct visualization.  After assuring adequate visualization, the robot was then docked in the usual fashion. Under direct visualization the robotic instruments replaced.   The round ligament on the patient's right side was transected with monopolar cautery. The anterior and posterior leaves of the broad ligament were then taken down in the usual fashion. The ureter was identified on the medial leaf of the broad ligament. A window was made between the IP and the ureter. The IP was coagulated with bipolar cautery and transected. The posterior leaf of the broad ligament was taken down to the level  of the KOH ring. The bladder flap was created using meticulous dissection and pinpoint cautery. The uterine vessels were coagulated with bipolar cautery. The uterine vessels were then transected and the C loop was created.  The same procedure was performed on the patient's left side.   The pneumo-occulder in the vagina was then insufflated. The colpotomy was then created in the usual fashion. The specimen was then delivered to the vagina and sent for frozen section.  Our attention was then drawn to opening the paravesical space on her right side the perirectal space was also opened. The obturator nerve was identified. The nodal bundle extending over the external iliac artery down to the external iliac vein was taken down using sharp dissection and monopolar cautery. The genitofemoral nerve was identified and spared. We continued our dissection down to the level of the obturator nerve. The nodal bundle superior to the obturator nerve was taken. All pedicles were noted to be hemostatic the ureter was noted to be well medial of the area of dissection. The nodal bundle was then placed and an Endo catch bag. A similar procedure was performed on the left side, however the obturator nodes were not taken on the left as frozen section returned at this time as minimal myometrial invasion of grade 1 lesion. The decision was made to stop the procedure is no further lymphadenectomy was indicated. The specimens were delivered through the vagina.  The vaginal cuff was closed with a running 0 Vicryl on CT 1 suture. The abdomen and pelvis were copiously irrigated and noted to be hemostatic. The robotic instruments were removed under direct visualization as were the robotic trochars. The pneumoperitoneum was removed. The patient was then taken out of the Trendelenburg position. Using of 0 Vicryl on a UR 6 needle the midline port facial incision was closed with a figure of eight with 0 vicryl on a UR 6, the subcutaneous tissues of the left upper quadrant port was reapproximated. The skin was closed using 4-0 Vicryl. Steri-Strips and benzoin were applied. The pneumo occluded balloon was removed from the vagina. The vagina was swabbed and noted to be  hemostatic.   All instrument needle and Ray-Tec counts were correct x2. The patient tolerated the procedure well and was taken to the recovery room in stable condition. This is Cleda Mccreedy dictating an operative note on patient Christina Griffith.

## 2012-10-08 ENCOUNTER — Encounter (HOSPITAL_COMMUNITY): Payer: Self-pay | Admitting: Gynecologic Oncology

## 2012-10-08 ENCOUNTER — Encounter: Payer: Self-pay | Admitting: Gynecologic Oncology

## 2012-10-08 DIAGNOSIS — Z8542 Personal history of malignant neoplasm of other parts of uterus: Secondary | ICD-10-CM | POA: Diagnosis present

## 2012-10-08 LAB — CBC
HCT: 32.3 % — ABNORMAL LOW (ref 36.0–46.0)
Hemoglobin: 10.4 g/dL — ABNORMAL LOW (ref 12.0–15.0)
MCH: 28.8 pg (ref 26.0–34.0)
MCHC: 32.2 g/dL (ref 30.0–36.0)
MCV: 89.5 fL (ref 78.0–100.0)
Platelets: 259 10*3/uL (ref 150–400)
RBC: 3.61 MIL/uL — ABNORMAL LOW (ref 3.87–5.11)
RDW: 14.3 % (ref 11.5–15.5)
WBC: 10.8 10*3/uL — ABNORMAL HIGH (ref 4.0–10.5)

## 2012-10-08 LAB — BASIC METABOLIC PANEL
BUN: 7 mg/dL (ref 6–23)
CO2: 26 mEq/L (ref 19–32)
Calcium: 8 mg/dL — ABNORMAL LOW (ref 8.4–10.5)
Chloride: 103 mEq/L (ref 96–112)
Creatinine, Ser: 0.63 mg/dL (ref 0.50–1.10)
GFR calc Af Amer: >90 mL/min (ref >90)
GFR calc non Af Amer: >90 mL/min (ref >90)
Glucose, Bld: 137 mg/dL — ABNORMAL HIGH (ref 70–99)
Potassium: 3.9 mEq/L (ref 3.5–5.1)
Sodium: 137 mEq/L (ref 135–145)

## 2012-10-08 MED ORDER — OXYCODONE-ACETAMINOPHEN 5-325 MG PO TABS: 1.0000 | ORAL_TABLET | ORAL | Status: DC | PRN

## 2012-10-08 NOTE — Discharge Summary (Signed)
Physician Discharge Summary  Patient ID: Christina Griffith MRN: [id number] DOB/AGE: 1950-09-06 62 y.o.  Admit date: 10/07/2012 Discharge date: 10/08/2012  Admission Diagnoses: Endometrial cancer  Discharge Diagnoses:  Principal Problem:   Endometrial cancer  Discharged Condition:  The patient is in good condition and stable for discharge.  Hospital Course:  On 10/07/2012, the patient underwent the following: Procedure(s):  ROBOTIC ASSISTED TOTAL HYSTERECTOMY WITH BILATERAL SALPINGO OOPHORECTOMY/POSSIBLE LYMPH NODE BIOPSY.  The postoperative course was uneventful.  She was discharged to home on postoperative day 1 tolerating a regular diet.  Consults: None  Significant Diagnostic Studies: None  Treatments: surgery: See above  Discharge Exam: Blood pressure 116/59, pulse 71, temperature 98.2 F (36.8 C), temperature source Oral, resp. rate 16, height 5' 7.75" (1.721 m), weight 156 lb 9.6 oz (71.033 kg), SpO2 100.00%. General appearance: alert, cooperative and no distress Resp: clear to auscultation bilaterally Cardio: regular rate and rhythm, S1, S2 normal, no murmur, click, rub or gallop GI: soft, non-tender; bowel sounds normal; no masses,  no organomegaly Extremities: extremities normal, atraumatic, no cyanosis or edema Incision/Wound: Lap sites with steri strips to the abdomen clean, dry, and intact  Disposition:   Discharge Orders   Future Appointments Provider Department Dept Phone   11/20/2012 9:30 AM Laurette Schimke, MD [facility] [phone]   Future Orders Complete By Expires     Call MD for:  difficulty breathing, headache or visual disturbances  As directed     Call MD for:  extreme fatigue  As directed     Call MD for:  hives  As directed     Call MD for:  persistant dizziness or light-headedness  As directed     Call MD for:  persistant nausea and vomiting  As directed     Call MD for:  redness, tenderness, or signs of infection  (pain, swelling, redness, odor or green/yellow discharge around incision site)  As directed     Call MD for:  severe uncontrolled pain  As directed     Call MD for:  temperature >100.4  As directed     Diet - low sodium heart healthy  As directed     Driving Restrictions  As directed     Comments:      No driving for 1 week.  Do not take narcotics and drive.    Increase activity slowly  As directed     Lifting restrictions  As directed     Comments:      No lifting greater than 10 lbs.    Sexual Activity Restrictions  As directed     Comments:      No sexual activity, nothing in the vagina, for 8 weeks.        Medication List    STOP taking these medications       PREMARIN 0.625 MG tablet  Generic drug:  estrogens (conjugated)     progesterone 100 MG capsule  Commonly known as:  PROMETRIUM      TAKE these medications       albuterol 108 (90 BASE) MCG/ACT inhaler  Commonly known as:  PROVENTIL HFA;VENTOLIN HFA  Inhale 2 puffs into the lungs every 6 (six) hours as needed for wheezing or shortness of breath.     atorvastatin 10 MG tablet  Commonly known as:  LIPITOR  Take 10 mg by mouth every evening.     CALCIUM 600+D HIGH POTENCY 600-400 MG-UNIT per tablet  Generic drug:  Calcium Carbonate-Vitamin D  Take 1 tablet by mouth daily.     fexofenadine 180 MG tablet  Commonly known as:  ALLEGRA  Take 180 mg by mouth daily as needed.     hydrochlorothiazide 25 MG tablet  Commonly known as:  HYDRODIURIL  Take 25 mg by mouth daily before breakfast.     ibuprofen 200 MG tablet  Commonly known as:  ADVIL,MOTRIN  Take 600 mg by mouth every 6 (six) hours as needed for pain.     magnesium gluconate 500 MG tablet  Commonly known as:  MAGONATE  Take 500 mg by mouth daily.     multivitamin tablet  Take 1 tablet by mouth daily.     oxyCODONE-acetaminophen 5-325 MG per tablet  Commonly known as:  PERCOCET/ROXICET  Take 1-2 tablets by mouth every 4 (four) hours as needed.      potassium gluconate 595 MG Tabs  Take 595 mg by mouth daily.     psyllium 95 % Pack  Commonly known as:  HYDROCIL/METAMUCIL  Take 1 packet by mouth daily.         Signed: CROSS, MELISSA DEAL 10/08/2012, 10:37 AM

## 2012-10-13 ENCOUNTER — Telehealth: Payer: Self-pay | Admitting: *Deleted

## 2012-10-13 NOTE — Telephone Encounter (Signed)
Patient notified of Path results.  F/U appt 11/20/12

## 2012-10-20 ENCOUNTER — Other Ambulatory Visit: Payer: Self-pay | Admitting: Family Medicine

## 2012-11-06 ENCOUNTER — Telehealth: Payer: Self-pay | Admitting: *Deleted

## 2012-11-06 ENCOUNTER — Other Ambulatory Visit: Payer: Self-pay | Admitting: *Deleted

## 2012-11-06 ENCOUNTER — Other Ambulatory Visit: Payer: BC Managed Care – PPO | Admitting: Lab

## 2012-11-06 DIAGNOSIS — R300 Dysuria: Secondary | ICD-10-CM

## 2012-11-06 LAB — URINALYSIS, MICROSCOPIC - CHCC
Bilirubin (Urine): NEGATIVE
Glucose: NEGATIVE mg/dL
Ketones: NEGATIVE mg/dL
Leukocyte Esterase: NEGATIVE
Nitrite: NEGATIVE
Protein: NEGATIVE mg/dL
Specific Gravity, Urine: 1.005 (ref 1.003–1.035)
Urobilinogen, UR: 0.2 mg/dL (ref 0.2–1)
pH: 7 (ref 4.6–8.0)

## 2012-11-06 NOTE — Telephone Encounter (Signed)
Tc from pt with c/o UTI. To come in today for u/a c&s.  Rx Septra DS one po BID x 10 days # 20 to [facility] [address] [phone] per Dr Duard Brady

## 2012-11-08 LAB — URINE CULTURE

## 2012-11-10 ENCOUNTER — Telehealth: Payer: Self-pay | Admitting: *Deleted

## 2012-11-10 NOTE — Telephone Encounter (Signed)
Pt notified of U/A C&S results and advised to continue abx until completed

## 2012-11-18 ENCOUNTER — Telehealth: Payer: Self-pay | Admitting: Gynecologic Oncology

## 2012-11-18 NOTE — Telephone Encounter (Signed)
Office Visit:  GYN ONCOLOGY  HPI:  Christina Griffith is a 62 y.o. year old iinitially seen in consultation for grade 1 endometrial cancer  She then underwent a RTLH BSO BPLND on 3/4/14without complications.  Her final pathologic diagnosis is a Stage 1A Grade 1 endometrioid endometrial cancer with no lymphovascular space invasion, no myometrial invasion and negative lymph nodes.  She is seen today for a postoperative check and to discuss her pathology results and treatment plan.  Since discharge from the hospital, she is feeling XXX.  She has improving appetite, normal bowel and bladder function, and pain controlled with minimal PO medication. She has no other complaints today.  Assessment:    62 y.o. year old with Stage IA Grade 1 endometrioid endometrial cancer.      Plan: 1) Pathology reports reviewed today 2) Treatment counseling - Very low risk (<5%) for recurrence given age, grade, depth of myometrial invasion and LVSI status. Multidisciplinary tumor board recommendation is for routine surveillance with frequent pelvic exams and visits with annual pap smear.  We will start with visits every 6 months x 2 years, then every 6 months for 3 more years, at which time she can return to annual visits.  Discussed signs and symptoms of recurrence including vaginal bleeding or discharge, leg pain or swelling and changes in bowel or bladder habits. She was given the opportunity to ask questions, which were answered to her satisfaction, and she is agreement with the above mentioned plan of care.  3)  Return to clinic in 6 months   Review of systems: Constitutional:  She has no weight gain or weight loss. She has no fever or chills. Eyes: No blurred vision Ears, Nose, Mouth, Throat: No dizziness, headaches or changes in hearing. No mouth sores. Cardiovascular: No chest pain, palpitations or edema. Respiratory:  No shortness of breath, wheezing or cough Gastrointestinal: She has normal bowel  movements without diarrhea or constipation. She denies any nausea or vomiting. She denies blood in her stool or heart burn. Genitourinary:  She denies pelvic pain, pelvic pressure or changes in her urinary function. She has no hematuria, dysuria, or incontinence. She has no irregular vaginal bleeding or vaginal discharge Musculoskeletal: Denies muscle weakness or joint pains.  Skin:  She has no skin changes, rashes or itching Neurological:  Denies dizziness or headaches. No neuropathy, no numbness or tingling. Psychiatric:  She denies depression or anxiety. Hematologic/Lymphatic:   No easy bruising or bleeding   Physical Exam: @v @ General: Well dressed, well nourished in no apparent distress.   HEENT:  Normocephalic and atraumatic, no lesions.  Extraocular muscles intact. Sclerae anicteric. Pupils equal, round, reactive. No mouth sores or ulcers. Thyroid is normal size, not nodular, midline. Skin:  No lesions or rashes. Breasts:  Soft, symmetric.  No skin or nipple changes.  No palpable LN or masses. Lungs:  Clear to auscultation bilaterally.  No wheezes. Cardiovascular:  Regular rate and rhythm.  No murmurs or rubs. Abdomen:  Soft, nontender, nondistended.  No palpable masses.  No hepatosplenomegaly.  No ascites. Normal bowel sounds.  No hernias.  Incision is XX Genitourinary: Normal EGBUS  Vaginal cuff intact.  No bleeding or discharge.  No cul de sac fullness. Extremities: No cyanosis, clubbing or edema.  No calf tenderness or erythema. No palpable cords. Psychiatric: Mood and affect are appropriate. Neurological: Awake, alert and oriented x 3. Sensation is intact, no neuropathy.  Musculoskeletal: No pain, normal strength and range of motion.

## 2012-11-19 ENCOUNTER — Telehealth: Payer: Self-pay | Admitting: Gynecologic Oncology

## 2012-11-20 ENCOUNTER — Ambulatory Visit: Payer: BC Managed Care – PPO | Attending: Gynecologic Oncology | Admitting: Gynecologic Oncology

## 2012-11-20 ENCOUNTER — Encounter: Payer: Self-pay | Admitting: Gynecologic Oncology

## 2012-11-20 VITALS — BP 122/68 | HR 86 | Temp 98.6°F | Resp 16 | Ht 67.91 in | Wt 156.4 lb

## 2012-11-20 DIAGNOSIS — C549 Malignant neoplasm of corpus uteri, unspecified: Secondary | ICD-10-CM | POA: Insufficient documentation

## 2012-11-20 DIAGNOSIS — C541 Malignant neoplasm of endometrium: Secondary | ICD-10-CM

## 2012-11-20 DIAGNOSIS — N951 Menopausal and female climacteric states: Secondary | ICD-10-CM

## 2012-11-20 NOTE — Progress Notes (Signed)
Office Visit:  GYN ONCOLOGY  HPI:  Christina Griffith is a 62 y.o. year old iinitially seen in consultation for grade 1 endometrial cancer  She then underwent a RTLH BSO BPLND on 10/07/12 without complications.  Her final pathologic diagnosis is a Stage 1A Grade 1 endometrioid endometrial cancer with no lymphovascular space invasion, no myometrial invasion and negative lymph nodes.  She is seen today for a postoperative check and to discuss her pathology results and treatment plan.  Since discharge from the hospital, she is feeling well but complains of disruptive vasomotor instability particularly at night.  Assessment:    62 y.o. year old with Stage IA Grade 1 endometrioid endometrial cancer.   Patient reports vasomotor instability which is causing disrupted sleep patterns.   Plan: 1) Pathology reports reviewed today with Christina Griffith 2) Treatment counseling - Very low risk (<5%) for recurrence given age, grade, depth of myometrial invasion and LVSI status. Multidisciplinary tumor board recommendation is for routine surveillance with frequent pelvic exams and visits with annual pap smear.  We will start with visits every 6 months x 2 years, then every 6 months for 3 more years, at which time she can return to annual visits.  Discussed signs and symptoms of recurrence including vaginal bleeding or discharge, leg pain or swelling and changes in bowel or bladder habits. She was given the opportunity to ask questions, which were answered to her satisfaction, and she is agreement with the above mentioned plan of care. 3).  The options presented the patient with respect to management of her vasomotor instability are use of SSRIs, clonidine, progesterone.  She was counseled regarding estrogen therapy in low grade low stage endometrial cancer. The patient is aware that probably prudent to try to not estrogen alternatives first.  She's been using phyto-estrogens with minimal symptom improvement.  An attempt was  made to prescribe effexor however this medication is not on Christina Griffith's formulary. We will contact her regarding her preferences for alternative treatment.  3)  Return to clinic in 6 months    Review of systems: Constitutional:  She has no weight gain or weight loss. She has no fever or chills. Cardiovascular: No chest pain, palpitations or edema. Respiratory:  No shortness of breath, wheezing or cough Gastrointestinal: She has normal bowel movements without diarrhea or constipation. She denies any nausea or vomiting. She denies blood in her stool or heart burn. Genitourinary:  She denies pelvic pain, pelvic pressure or changes in her urinary function. She has no hematuria, or incontinence. She has no irregular vaginal bleeding or vaginal discharge.  Recently treated for UTI Musculoskeletal: Denies muscle weakness or joint pains.  Skin:  She has no skin changes, rashes or itching Neurological:  Denies dizziness or headaches. No neuropathy, no numbness or tingling.Reports hot flashes that are particularly disruptive at night. Has been awake since her 3 AM hot flash  Psychiatric:  She denies depression or anxiety.   Physical Exam: BP 122/68  Pulse 86  Temp(Src) 98.6 F (37 C) (Oral)  Resp 16  Ht 5' 7.91" (1.725 m)  Wt 156 lb 6.4 oz (70.943 kg)  BMI 23.84 kg/m2  General: Well dressed, well nourished in no apparent distress.   HEENT:  Normocephalic and atraumatic, no lesions.   Lungs:  Clear to auscultation bilaterally.  No wheezes. Cardiovascular:  Regular rate and rhythm.  No murmurs or rubs. Abdomen:  Soft, nontender, nondistended.  No palpable masses.  No hepatosplenomegaly.  No ascites. Normal bowel sounds.  No hernias.  Incisions are intact without any evidence of hernia or masses   Genitourinary: Normal EGBUS  Vaginal cuff separated in the midportion and silver nitrate applied.  No bleeding or discharge.  No cul de sac fullness. Extremities: No cyanosis, clubbing or edema.  No  calf tenderness or erythema. No palpable cords. Psychiatric: Mood and affect are appropriate. Neurological: Awake, alert and oriented x 3. Sensation is intact, no neuropathy. Musculoskeletal: No pain, normal strength and range of motion.

## 2012-11-20 NOTE — Patient Instructions (Addendum)
Followup with GYN oncology in 6 months Follow up with Dr. Jennette Kettle in 12 months Annual Pap tests with Dr. Jennette Kettle. Will prescribed Effexor for management of vasomotor instability  Thank you very much Ms. Christina Griffith for allowing me to provide care for you today.  I appreciate your confidence in choosing our Gynecologic Oncology team.  If you have any questions about your visit today please call our office and we will get back to you as soon as possible.  Maryclare Labrador. Katasha Riga MD., PhD Gynecologic Oncology

## 2012-11-24 ENCOUNTER — Telehealth: Payer: Self-pay | Admitting: *Deleted

## 2012-11-24 NOTE — Telephone Encounter (Signed)
msg left for pt that Rx Effexor 75mg  one po q hs called to her pharmacy

## 2012-11-24 NOTE — Telephone Encounter (Signed)
erroe

## 2012-12-17 NOTE — Telephone Encounter (Signed)
vvv

## 2012-12-25 ENCOUNTER — Telehealth: Payer: Self-pay | Admitting: Family Medicine

## 2012-12-25 DIAGNOSIS — E7800 Pure hypercholesterolemia, unspecified: Secondary | ICD-10-CM

## 2012-12-25 DIAGNOSIS — I10 Essential (primary) hypertension: Secondary | ICD-10-CM

## 2012-12-25 NOTE — Telephone Encounter (Signed)
Message copied by Excell Seltzer on Thu Dec 25, 2012  5:07 PM ------      Message from: Alvina Chou      Created: Thu Dec 11, 2012  3:42 PM      Regarding: lab orders for Friday, 5.23.14       Patient is scheduled for CPX labs, please order future labs, Thanks , Terri       ------

## 2012-12-26 ENCOUNTER — Other Ambulatory Visit (INDEPENDENT_AMBULATORY_CARE_PROVIDER_SITE_OTHER): Payer: BC Managed Care – PPO

## 2012-12-26 DIAGNOSIS — E7800 Pure hypercholesterolemia, unspecified: Secondary | ICD-10-CM

## 2012-12-26 DIAGNOSIS — I10 Essential (primary) hypertension: Secondary | ICD-10-CM

## 2012-12-26 LAB — LIPID PANEL
Cholesterol: 162 mg/dL (ref 0–200)
HDL: 78.2 mg/dL (ref >39.00)
LDL Cholesterol: 64 mg/dL (ref 0–99)
Total CHOL/HDL Ratio: 2
Triglycerides: 97 mg/dL (ref 0.0–149.0)
VLDL: 19.4 mg/dL (ref 0.0–40.0)

## 2012-12-26 LAB — COMPREHENSIVE METABOLIC PANEL
ALT: 43 U/L — ABNORMAL HIGH (ref 0–35)
AST: 41 U/L — ABNORMAL HIGH (ref 0–37)
Albumin: 3.9 g/dL (ref 3.5–5.2)
Alkaline Phosphatase: 68 U/L (ref 39–117)
BUN: 14 mg/dL (ref 6–23)
CO2: 31 mEq/L (ref 19–32)
Calcium: 9.5 mg/dL (ref 8.4–10.5)
Chloride: 101 mEq/L (ref 96–112)
Creatinine, Ser: 0.7 mg/dL (ref 0.4–1.2)
GFR: 88.34 mL/min (ref >60.00)
Glucose, Bld: 86 mg/dL (ref 70–99)
Potassium: 3.8 mEq/L (ref 3.5–5.1)
Sodium: 138 mEq/L (ref 135–145)
Total Bilirubin: 0.7 mg/dL (ref 0.3–1.2)
Total Protein: 7 g/dL (ref 6.0–8.3)

## 2013-01-02 ENCOUNTER — Ambulatory Visit (INDEPENDENT_AMBULATORY_CARE_PROVIDER_SITE_OTHER): Payer: BC Managed Care – PPO | Admitting: Family Medicine

## 2013-01-02 ENCOUNTER — Encounter: Payer: Self-pay | Admitting: Family Medicine

## 2013-01-02 VITALS — BP 120/80 | HR 80 | Temp 98.0°F | Ht 67.5 in | Wt 158.8 lb

## 2013-01-02 DIAGNOSIS — N39498 Other specified urinary incontinence: Secondary | ICD-10-CM

## 2013-01-02 DIAGNOSIS — R7401 Elevation of levels of liver transaminase levels: Secondary | ICD-10-CM

## 2013-01-02 DIAGNOSIS — R7402 Elevation of levels of lactic acid dehydrogenase (LDH): Secondary | ICD-10-CM

## 2013-01-02 DIAGNOSIS — C549 Malignant neoplasm of corpus uteri, unspecified: Secondary | ICD-10-CM

## 2013-01-02 DIAGNOSIS — C541 Malignant neoplasm of endometrium: Secondary | ICD-10-CM

## 2013-01-02 DIAGNOSIS — I10 Essential (primary) hypertension: Secondary | ICD-10-CM

## 2013-01-02 DIAGNOSIS — Z0000 Encounter for general adult medical examination without abnormal findings: Secondary | ICD-10-CM

## 2013-01-02 DIAGNOSIS — E7800 Pure hypercholesterolemia, unspecified: Secondary | ICD-10-CM

## 2013-01-02 NOTE — Assessment & Plan Note (Signed)
Minimal ETOH.  No hepatitis risk.  On statin.  Recheck and check hepatitis panel.

## 2013-01-02 NOTE — Progress Notes (Signed)
HPI  The patient is here for annual wellness exam and preventative care.     Recent diagnosis of grade 1 endometrial cancer She then underwent a RTLH BSO BPLND on 10/07/12 without complications. Her final pathologic diagnosis is a Stage 1A Grade 1 endometrioid endometrial cancer with no lymphovascular space invasion, no myometrial invasion and negative lymph nodes. GYN ONC: Dr. Lester [place].. Followed every 6 months. GYN is Dr. Jennette Kettle   Now on effexor for menopausal syndrome. Now of hormones. Has also helped her mood. Wt Readings from Last 3 Encounters:  01/02/13 158 lb 12.8 oz (72.031 kg)  11/20/12 156 lb 6.4 oz (70.943 kg)  10/07/12 156 lb 9.6 oz (71.033 kg)     Hypertension: Well controlled on HCTZ.  Using medication without problems or lightheadedness: None  Chest pain with exertion:None  Edema:None  Short of breath:None  Average home BPs: 120/70  Other issues: palpitation resolved with  Stopping caffeine   Elevated Cholesterol: Well controlled on lipitor 10 mg daily  Lab Results  Component Value Date   CHOL 162 12/26/2012   HDL 78.20 12/26/2012   LDLCALC 64 12/26/2012   TRIG 97.0 12/26/2012   CHOLHDL 2 12/26/2012  Using medications without problems:None  Muscle aches: None  Diet compliance:Good.  Exercise:Speed walking, treadmill 5 days a week  Other complaints:  Hx of Mild intermittant asthma: stable, last used inhaler in 07/2011. Treated for bacterial bronchitis. Last 2006 pneumonia vaccine   LFTS elevated slightly.  On statin. No clear risk for hepatitis. Drinks one glass of wine/beer three times a week. No new OTC, teylenol... effexor is new.    Urinary incontinence, stress: Seen Dr. Ronal Fear ... Improved with 8 weeks of PT. She has noted a lot of improvement in this issue following surgery.    Review of Systems  Constitutional: Negative for fever, fatigue and unexpected weight change.  HENT: Negative for ear pain, congestion, sore throat, sneezing, trouble swallowing  and sinus pressure.  Eyes: Negative for pain and itching.  Respiratory: Negative for cough, shortness of breath and wheezing.  Cardiovascular: Negative for chest pain, palpitations and leg swelling.  Gastrointestinal: Negative for nausea, abdominal pain, diarrhea, constipation and blood in stool.  Genitourinary: Negative for dysuria, hematuria, vaginal discharge and difficulty urinating.  Skin: Negative for rash.  Neurological: Negative for syncope, weakness, light-headedness, numbness and headaches.  Psychiatric/Behavioral: Negative for confusion and dysphoric mood. The patient is not nervous/anxious.  Objective:   Physical Exam  Constitutional: Vital signs are normal. She appears well-developed and well-nourished. She is cooperative. Non-toxic appearance. She does not appear ill. No distress.  HENT:  Head: Normocephalic.  Right Ear: Hearing, tympanic membrane, external ear and ear canal normal.  Left Ear: Hearing, tympanic membrane, external ear and ear canal normal.  Nose: Nose normal.  Eyes: Conjunctivae, EOM and lids are normal. Pupils are equal, round, and reactive to light. No foreign bodies found.  Neck: Trachea normal and normal range of motion. Neck supple. Carotid bruit is not present. No mass and no thyromegaly present.  Cardiovascular: Normal rate, regular rhythm, S1 normal, S2 normal, normal heart sounds and intact distal pulses. Exam reveals no gallop.  No murmur heard.  Pulmonary/Chest: Effort normal and breath sounds normal. No respiratory distress. She has no wheezes. She has no rhonchi. She has no rales.  Abdominal: Soft. Normal appearance and bowel sounds are normal. She exhibits no distension, no fluid wave, no abdominal bruit and no mass. There is no hepatosplenomegaly. There is no tenderness. There is no  rebound, no guarding and no CVA tenderness. No hernia.  Healing scars from laproscopic surgery. Lymphadenopathy:  She has no cervical adenopathy.  She has no axillary  adenopathy.  Neurological: She is alert. She has normal strength. No cranial nerve deficit or sensory deficit.  Skin: Skin is warm, dry and intact. No rash noted.  Psychiatric: Her speech is normal and behavior is normal. Judgment normal. Her mood appears not anxious. Cognition and memory are normal. She does not exhibit a depressed mood.  Assessment & Plan:   The patient's preventative maintenance and recommended screening tests for an annual wellness exam were reviewed in full today.  Brought up to date unless services declined.  Counselled on the importance of diet, exercise, and its role in overall health and mortality.  The patient's FH and SH was reviewed, including their home life, tobacco status, and drug and alcohol status.   Vaccines:uptodate with Td,zoster, PNA Nonsmoker  Mammo: nml in 08/2012 DVE/PAP:Sees Dr. Jennette Kettle for GYN .  Colon:Colonoscopy Result Date: 07/06/2010  Colonoscopy Result: 5 polyps  Colonoscopy Next Due: 5 yr  DEXA:12/2009 nml

## 2013-01-02 NOTE — Assessment & Plan Note (Signed)
Improved after surgery and pelvic floor exercises.

## 2013-01-02 NOTE — Patient Instructions (Addendum)
Stop at lab on your way out.  Continue working on healthy eating, regular exercise and weight maintenance.

## 2013-01-02 NOTE — Assessment & Plan Note (Signed)
Well controlled. Continue current medication.  

## 2013-01-03 LAB — HEPATIC FUNCTION PANEL
ALT: 36 U/L — ABNORMAL HIGH (ref 0–35)
AST: 34 U/L (ref 0–37)
Albumin: 4.2 g/dL (ref 3.5–5.2)
Alkaline Phosphatase: 87 U/L (ref 39–117)
Bilirubin, Direct: 0.1 mg/dL (ref 0.0–0.3)
Indirect Bilirubin: 0.2 mg/dL (ref 0.0–0.9)
Total Bilirubin: 0.3 mg/dL (ref 0.3–1.2)
Total Protein: 6.8 g/dL (ref 6.0–8.3)

## 2013-01-05 LAB — HEPATITIS PANEL, ACUTE
HCV Ab: NEGATIVE
Hep A IgM: NEGATIVE
Hep B C IgM: NEGATIVE
Hepatitis B Surface Ag: NEGATIVE

## 2013-01-16 ENCOUNTER — Other Ambulatory Visit: Payer: Self-pay | Admitting: Family Medicine

## 2013-05-15 ENCOUNTER — Ambulatory Visit (INDEPENDENT_AMBULATORY_CARE_PROVIDER_SITE_OTHER): Payer: 59 | Admitting: Family Medicine

## 2013-05-15 ENCOUNTER — Encounter: Payer: Self-pay | Admitting: Family Medicine

## 2013-05-15 VITALS — BP 146/90 | HR 106 | Temp 98.5°F | Ht 67.5 in | Wt 155.8 lb

## 2013-05-15 DIAGNOSIS — R300 Dysuria: Secondary | ICD-10-CM

## 2013-05-15 DIAGNOSIS — N390 Urinary tract infection, site not specified: Secondary | ICD-10-CM

## 2013-05-15 LAB — POCT URINALYSIS DIPSTICK
Bilirubin, UA: NEGATIVE
Glucose, UA: NEGATIVE
Ketones, UA: NEGATIVE
Nitrite, UA: POSITIVE
Spec Grav, UA: 1.015
Urobilinogen, UA: 0.2
pH, UA: 7

## 2013-05-15 MED ORDER — CIPROFLOXACIN HCL 250 MG PO TABS: 250.0000 mg | ORAL_TABLET | Freq: Two times a day (BID) | ORAL | Status: DC

## 2013-05-15 NOTE — Patient Instructions (Signed)
Drink lots of water Take cipro as directed We will call you with a culture result

## 2013-05-17 LAB — POCT UA - MICROSCOPIC ONLY
Casts, Ur, LPF, POC: 0
Yeast, UA: 0

## 2013-05-17 NOTE — Assessment & Plan Note (Signed)
Cover with cipro Fluids enc Update if not starting to improve in a week or if worsening

## 2013-05-17 NOTE — Progress Notes (Signed)
Subjective:    Patient ID: Christina Griffith, female    DOB: 11/24/1950, 62 y.o.   MRN: [id number]  HPI  Here for urinary symptoms  dysruia and urgency - occ frequency  No blood in urine of flank pain   No n/v or fever  Patient Active Problem List   Diagnosis Date Noted  . UTI (urinary tract infection) 05/15/2013  . Elevated transaminase level 01/02/2013  . Endometrial cancer 10/08/2012  . HYPERCHOLESTEROLEMIA 09/15/2010  . ESSENTIAL HYPERTENSION, BENIGN 09/15/2010  . ALLERGIC RHINITIS, SEASONAL 09/15/2010  . ASTHMA, INTERMITTENT, MILD 09/15/2010  . GERD 09/15/2010  . DEGENERATIVE DISC DISEASE 09/15/2010  . SPINAL STENOSIS 09/15/2010  . URINARY INCONTINENCE, STRESS 09/15/2010  . DEPRESSION, SITUATIONAL, HX OF 09/15/2010  . COLONIC POLYPS, ADENOMATOUS, HX OF 09/15/2010  . HEMATURIA, MICROSCOPIC, HX OF 09/15/2010   Past Medical History  Diagnosis Date  . Endometrial ca   . Asthma   . Hypertension   . Cold 10/03/12    pt getting over a cold--still has some head congestion, slight cough  . Arthritis     spinal stenosis--back pain--hx of herniated cervical disk- but no surgery  . H/O: hematuria     benign essential hematuria - per urology work up    Past Surgical History  Procedure Laterality Date  . Total hip arthroplasty      right 2002 left 2004  . Breast surgery      breast biopsy  . Hysteroscopy w/d&c Left 1/24/204    w/ resection of Endometrial polyps  . Robotic assisted total hysterectomy with bilateral salpingo oopherectomy N/A 10/07/2012    Procedure: ROBOTIC ASSISTED TOTAL HYSTERECTOMY WITH BILATERAL SALPINGO OOPHORECTOMY/POSSIBLE LYMPH NODE BIOPSY;  Surgeon: Rejeana Brock A. Duard Brady, MD;  Location: [facility];  Service: Gynecology;  Laterality: N/A;   History  Substance Use Topics  . Smoking status: Never Smoker   . Smokeless tobacco: Never Used  . Alcohol Use: Yes     Comment: 3 x a week   Family History  Problem Relation Age of Onset  . Hypertension Mother   .  Cancer Father     bladder and prostate  . Hyperlipidemia Father   . Cancer Maternal Grandmother     colon  . Breast cancer Cousin    No Known Allergies Current Outpatient Prescriptions on File Prior to Visit  Medication Sig Dispense Refill  . atorvastatin (LIPITOR) 10 MG tablet TAKE 1 TABLET BY MOUTH ONCE A DAY  30 tablet  11  . Calcium Carbonate-Vitamin D (CALCIUM 600+D HIGH POTENCY) 600-400 MG-UNIT per tablet Take 1 tablet by mouth daily.      . hydrochlorothiazide (HYDRODIURIL) 25 MG tablet TAKE 1 TABLET BY MOUTH ONCE A DAY  30 tablet  6  . ibuprofen (ADVIL,MOTRIN) 200 MG tablet Take 600 mg by mouth every 6 (six) hours as needed for pain.      . magnesium gluconate (MAGONATE) 500 MG tablet Take 500 mg by mouth daily.      . Multiple Vitamin (MULTIVITAMIN) tablet Take 1 tablet by mouth daily.      . potassium gluconate 595 MG TABS Take 595 mg by mouth daily.      Marland Kitchen venlafaxine XR (EFFEXOR-XR) 75 MG 24 hr capsule Take 1 capsule by mouth at bedtime.       No current facility-administered medications on file prior to visit.     Review of Systems Review of Systems  Constitutional: Negative for fever, appetite change, fatigue and unexpected weight change.  Eyes:  Negative for pain and visual disturbance.  Respiratory: Negative for cough and shortness of breath.   Cardiovascular: Negative for cp or palpitations    Gastrointestinal: Negative for nausea, diarrhea and constipation.  Genitourinary:pos for urgency and frequency.  Skin: Negative for pallor or rash   Neurological: Negative for weakness, light-headedness, numbness and headaches.  Hematological: Negative for adenopathy. Does not bruise/bleed easily.  Psychiatric/Behavioral: Negative for dysphoric mood. The patient is not nervous/anxious.         Objective:   Physical Exam  Constitutional: She appears well-developed and well-nourished.  HENT:  Head: Normocephalic and atraumatic.  Eyes: Conjunctivae and EOM are normal.  Pupils are equal, round, and reactive to light. No scleral icterus.  Neck: Normal range of motion. Neck supple. No thyromegaly present.  Cardiovascular: Normal rate and regular rhythm.   Pulmonary/Chest: Effort normal and breath sounds normal.  Abdominal: Soft. Bowel sounds are normal. She exhibits no distension and no mass. There is no tenderness.  No suprapubic tenderness or fullness    Musculoskeletal: She exhibits no edema.  No cva tenderness   Lymphadenopathy:    She has no cervical adenopathy.  Neurological: She is alert. She has normal reflexes.  Skin: Skin is warm and dry. No rash noted.  Psychiatric: She has a normal mood and affect.          Assessment & Plan:

## 2013-05-18 LAB — URINE CULTURE: Colony Count: >=100000

## 2013-05-20 ENCOUNTER — Other Ambulatory Visit: Payer: Self-pay | Admitting: Family Medicine

## 2013-05-21 ENCOUNTER — Ambulatory Visit: Payer: 59 | Attending: Gynecologic Oncology | Admitting: Gynecologic Oncology

## 2013-05-21 ENCOUNTER — Encounter: Payer: Self-pay | Admitting: Gynecologic Oncology

## 2013-05-21 VITALS — BP 145/78 | HR 95 | Temp 98.1°F | Resp 16 | Ht 67.91 in | Wt 156.7 lb

## 2013-05-21 DIAGNOSIS — C549 Malignant neoplasm of corpus uteri, unspecified: Secondary | ICD-10-CM | POA: Insufficient documentation

## 2013-05-21 DIAGNOSIS — Z90710 Acquired absence of both cervix and uterus: Secondary | ICD-10-CM | POA: Insufficient documentation

## 2013-05-21 DIAGNOSIS — I10 Essential (primary) hypertension: Secondary | ICD-10-CM | POA: Insufficient documentation

## 2013-05-21 DIAGNOSIS — J45909 Unspecified asthma, uncomplicated: Secondary | ICD-10-CM | POA: Insufficient documentation

## 2013-05-21 DIAGNOSIS — Z96649 Presence of unspecified artificial hip joint: Secondary | ICD-10-CM | POA: Insufficient documentation

## 2013-05-21 DIAGNOSIS — C541 Malignant neoplasm of endometrium: Secondary | ICD-10-CM

## 2013-05-21 MED ORDER — ESTROGENS CONJUGATED 0.3 MG PO TABS: 0.3000 mg | ORAL_TABLET | Freq: Every day | ORAL | Status: DC

## 2013-05-21 NOTE — Progress Notes (Signed)
Consult Note: Gyn-Onc  [name] 62 y.o. female  CC:  Chief Complaint  Patient presents with  . Endometrial cancer    Follow up    HPI: Christina Griffith is a 62 y.o. year old iinitially seen in consultation for grade 1 endometrial cancer She then underwent a RTLH BSO BPLND on 10/07/12 without complications. Her final pathologic diagnosis is a Stage 1A Grade 1 endometrioid endometrial cancer with no lymphovascular space invasion, no myometrial invasion and negative lymph nodes.  Interval History:  She is overall doing well. She has 3 issue she wanted to discuss with [place].  As we should a urinary tract infection treated with Cipro. She had 16 weeks postoperatively. Both times with Citrobacter. She went to assure was related to her surgery. 2 months postoperatively she also noticed a vision change her left side. She thought might be related to her Effexor. She saw her ophthalmologist. She was dispositioned bilateral cataracts left greater than right. She was told that this could be a result of the stress of surgery as well as to personal stress she had an selecting a new job. She has corrective lenses and now her vision is better. She also in August developed map mouth ulcer. She states it started as a component she began having pain: Upper sinus. She herself drained with a sterile needle and to clindamycin and the pain was better. I discussed with them that should any of these are technically related to her surgery. However, with menopause some women do have an increased risk of urinary tract infection secondary to the estrogen changes within the urothelium. In addition, vision changes can be associated with menopause. I do not believe that the abscess she hadwas related to her surgery or menopause. She states for the first 2 months the Effexor helped significantly. Her hot flashes went from every 2 hours to 4 times a day. Her mood is much better she had less anxiety. Now she does have hot  flashes are back and they're in increased intensity. She does have some occasional discomfort at the area of the left port incision when she stretches with yoga. She's due for her mammogram in January.  Review of Systems  Constitutional: Reporting a decrease in frequency of hot flashes since starting Effexor XR.  Denies fever. Skin: No rash, sores, jaundice, itching, or dryness.  Cardiovascular: No chest pain, shortness of breath, or edema  Pulmonary: No cough or wheeze.  Gastro Intestinal:  No nausea, vomiting, constipation, or diarrhea reported. No bright red blood per rectum or change in bowel movement.  Genitourinary: No frequency, urgency, or dysuria.  Denies vaginal bleeding and discharge.  Musculoskeletal: No myalgia, arthralgia, joint swelling or pain.  Neurologic: No weakness, numbness, or change in gait.  Psychology: Improved with effexor.  Current Meds:  Outpatient Encounter Prescriptions as of 05/21/2013  Medication Sig Dispense Refill  . atorvastatin (LIPITOR) 10 MG tablet TAKE 1 TABLET BY MOUTH ONCE A DAY  30 tablet  11  . Calcium Carbonate-Vitamin D (CALCIUM 600+D HIGH POTENCY) 600-400 MG-UNIT per tablet Take 1 tablet by mouth daily.      . hydrochlorothiazide (HYDRODIURIL) 25 MG tablet TAKE 1 TABLET BY MOUTH ONCE A DAY  30 tablet  5  . ibuprofen (ADVIL,MOTRIN) 200 MG tablet Take 600 mg by mouth every 6 (six) hours as needed for pain.      . magnesium gluconate (MAGONATE) 500 MG tablet Take 500 mg by mouth daily.      . Multiple  Vitamin (MULTIVITAMIN) tablet Take 1 tablet by mouth daily.      . potassium gluconate 595 MG TABS Take 595 mg by mouth daily.      . Triamcinolone Acetonide (NASACORT ALLERGY 24HR NA) Place into the nose as needed.      . venlafaxine XR (EFFEXOR-XR) 75 MG 24 hr capsule Take 1 capsule by mouth at bedtime.      . ciprofloxacin (CIPRO) 250 MG tablet Take 1 tablet (250 mg total) by mouth 2 (two) times daily.  10 tablet  0   No facility-administered  encounter medications on file as of 05/21/2013.    Allergy: No Known Allergies  Social Hx:   History   Social History  . Marital Status: Married    Spouse Name: N/A    Number of Children: N/A  . Years of Education: N/A   Occupational History  . nurse practioner    Social History Main Topics  . Smoking status: Never Smoker   . Smokeless tobacco: Never Used  . Alcohol Use: Yes     Comment: 3 x a week  . Drug Use: No  . Sexual Activity: Not on file   Other Topics Concern  . Not on file   Social History Narrative   Regular exercise- yes, 5-6 days a week, yoga   Diet: healthy    Past Surgical Hx:  Past Surgical History  Procedure Laterality Date  . Total hip arthroplasty      right 2002 left 2004  . Breast surgery      breast biopsy  . Hysteroscopy w/d&c Left 1/24/204    w/ resection of Endometrial polyps  . Robotic assisted total hysterectomy with bilateral salpingo oopherectomy N/A 10/07/2012    Procedure: ROBOTIC ASSISTED TOTAL HYSTERECTOMY WITH BILATERAL SALPINGO OOPHORECTOMY/POSSIBLE LYMPH NODE BIOPSY;  Surgeon: Rejeana Brock A. Duard Brady, MD;  Location: [facility];  Service: Gynecology;  Laterality: N/A;    Past Medical Hx:  Past Medical History  Diagnosis Date  . Endometrial ca   . Asthma   . Hypertension   . Cold 10/03/12    pt getting over a cold--still has some head congestion, slight cough  . Arthritis     spinal stenosis--back pain--hx of herniated cervical disk- but no surgery  . H/O: hematuria     benign essential hematuria - per urology work up     Oncology Hx:    Endometrial cancer   10/07/2012 Surgery RTH/BSO adn BPLND. IA grade 1    Family Hx:  Family History  Problem Relation Age of Onset  . Hypertension Mother   . Cancer Father     bladder and prostate  . Hyperlipidemia Father   . Cancer Maternal Grandmother     colon  . Breast cancer Cousin     Vitals:  Blood pressure 145/78, pulse 95, temperature 98.1 F (36.7 C), temperature source Oral,  resp. rate 16, height 5' 7.91" (1.725 m), weight 156 lb 11.2 oz (71.079 kg).  Physical Exam: Well-nourished well-developed female in no acute distress.  Neck: Supple, no lymphadenopathy no thyromegaly.   Lungs: Clear to auscultation.  Cardiovascular: Regular rate and rhythm.  Abdomen: Well-healed port site incisions. Abdomen is soft, nontender, nondistended. There is no palpable masses or hepatosplenomegaly.  Extremities: No edema.  Groins: No lymphadenopathy.  Pelvic: Normal external female genitalia. Vagina slightly atrophic. There are no visible lesions. Bimanual examination reveals no masses or nodularity. Rectal confirms  Assessment/Plan: 62 y.o. year old with Stage IA Grade 1 endometrioid endometrial cancer.  Plan:  1) Pathology reports reviewed today with Ms. Kosch  2) Treatment counseling - Very low risk (<5%) for recurrence given age, grade, depth of myometrial invasion and LVSI status. Multidisciplinary tumor board recommendation is for routine surveillance with frequent pelvic exams and visits with annual pap smear. We will start with visits every 6 months x 2 years, then every 6 months for 3 more years, at which time she can return to annual visits. Discussed signs and symptoms of recurrence including vaginal bleeding or discharge, leg pain or swelling and changes in bowel or bladder habits.  3) She wishes to a low-dose estrogen her symptoms. A prescription for Premarin 0.3 mg was sent to her pharmacy. Precautions particularly regarding deep venous thrombosis were discussed with the patient she was given a patient information sheet regarding Premarin. She is scheduled for mammogram in January and will keep that appointment.  She will follow up with Dr. Jennette Kettle in 6 months and return to see [place] in one year.  Acire Tang A., MD 05/21/2013, 9:08 AM

## 2013-05-21 NOTE — Patient Instructions (Addendum)
Follow up with Dr. Jennette Kettle in 6 months and return to see [place] in 1 year.Conjugated Estrogens tablets What is this medicine? CONJUGATED ESTROGENS (CON ju gate ed ESS troe jenz) is an estrogen. It is used as hormone replacement in menopausal women. It helps to treat hot flashes and prevent osteoporosis. It is also used to treat women with low hormone levels or in those who have had their ovaries removed. This medicine may be used for other purposes; ask your health care provider or pharmacist if you have questions. What should I tell my health care provider before I take this medicine? They need to know if you have any of these conditions: -abnormal vaginal bleeding -blood vessel disease or blood clots -breast, cervical, endometrial, ovarian, liver, or uterine cancer -dementia -diabetes -endometriosis -fibroids -gallbladder disease -heart disease or recent heart attack -high blood pressure -high cholesterol -high level of calcium in the blood -kidney disease -liver disease -mental depression -migraine headaches -protein C deficiency -protein S deficiency -stroke -tobacco smoker -an unusual or allergic reaction to estrogens, other medicines, foods, dyes, or preservatives -pregnant or trying to get pregnant -breast-feeding How should I use this medicine? Take this medicine by mouth with a glass of water. Follow the directions on the prescription label. Take your medicine at regular intervals, at the same time each day. Do not take your medicine more often than directed. A patient package insert for the product will be given with each prescription and refill. Read this sheet carefully each time. Talk to your pediatrician regarding the use of this medicine in children. This medicine is not approved for use in children. Overdosage: If you think you have taken too much of this medicine contact a poison control center or emergency room at once. NOTE: This medicine is only for you. Do not share  this medicine with others. What if I miss a dose? If you miss a dose, take it as soon as you can. If it is almost time for your next dose, take only that dose. Do not take double or extra doses. What may interact with this medicine? Do not take this medicine with any of the following medications: -aromatase inhibitors like aminoglutethimide, anastrozole, exemestane, letrozole, testolactone -metyrapone This medicine may also interact with the following medications: -barbiturates, such as phenobarbital -carbamazepine -clarithromycin -erythromycin -grapefruit juice -medicines for fungal infections like ketoconazole and itraconazole -phenytoin - rifampin -ritonavir -St. John's Wort -thyroid hormones This list may not describe all possible interactions. Give your health care provider a list of all the medicines, herbs, non-prescription drugs, or dietary supplements you use. Also tell them if you smoke, drink alcohol, or use illegal drugs. Some items may interact with your medicine. What should I watch for while using this medicine? Visit your health care professional for regular checks on your progress. You will need a regular breast and pelvic exam and Pap smear while on this medicine. You should also discuss the need for regular mammograms with your health care professional, and follow his or her guidelines for these tests. This medicine can make your body retain fluid, making your fingers, hands, or ankles swell. Your blood pressure can go up. Contact your doctor or health care professional if you feel you are retaining fluid. If you have any reason to think you are pregnant; stop taking this medicine at once and contact your doctor or health care professional. Smoking increases the risk of getting a blood clot or having a stroke while you are taking this medicine, especially if  you are more than 63 years old. You are strongly advised not to smoke. If you wear contact lenses and notice visual  changes, or if the lenses begin to feel uncomfortable, consult your eye care specialist. The tablet shell for some brands of this medicine does not dissolve. The tablet shell may appear whole in the stool. This is not a cause for concern. If you see something that resembles a tablet in your stool, talk to your healthcare provider. This medicine can increase the risk of developing a condition (endometrial hyperplasia) that may lead to cancer of the lining of the uterus. Taking progestins, another hormone drug, with this medicine lowers the risk of developing this condition. Therefore, if your uterus has not been removed (by a hysterectomy), your doctor may prescribe a progestin for you to take together with your estrogen. You should know, however, that taking estrogens with progestins may have additional health risks. You should discuss the use of estrogens and progestins with your health care professional to determine the benefits and risks for you. If you are going to have surgery, you may need to stop taking this medicine. Consult your health care professional for advice before you schedule the surgery. What side effects may I notice from receiving this medicine? Side effects that you should report to your doctor or health care professional as soon as possible: -allergic reactions like skin rash, itching or hives, swelling of the face, lips, or tongue -breast tissue changes or discharge -changes in vision -chest pain -confusion, trouble speaking or understanding -dark urine -general ill feeling or flu-like symptoms -light-colored stools -nausea, vomiting -pain, swelling, warmth in the leg -right upper belly pain -severe headaches -shortness of breath -sudden numbness or weakness of the face, arm or leg -trouble walking, dizziness, loss of balance or coordination -unusual vaginal bleeding -yellowing of the eyes or skin Side effects that usually do not require medical attention (report to your  doctor or health care professional if they continue or are bothersome): -hair loss -increased hunger or thirst -increased urination -symptoms of vaginal infection like itching, irritation or unusual discharge -unusually weak or tired This list may not describe all possible side effects. Call your doctor for medical advice about side effects. You may report side effects to FDA at 1-800-FDA-1088. Where should I keep my medicine? Keep out of the reach of children. Store at room temperature between 15 and 30 degrees C (59 and 86 degrees F). Throw away any unused medicine after the expiration date. NOTE: This sheet is a summary. It may not cover all possible information. If you have questions about this medicine, talk to your doctor, pharmacist, or health care provider.  2013, [employer]. (10/25/2010 9:20:56 AM)

## 2013-06-11 ENCOUNTER — Other Ambulatory Visit: Payer: Self-pay

## 2013-08-28 ENCOUNTER — Encounter: Payer: Self-pay | Admitting: Family Medicine

## 2013-11-10 ENCOUNTER — Other Ambulatory Visit: Payer: Self-pay | Admitting: Family Medicine

## 2013-11-10 ENCOUNTER — Encounter: Payer: Self-pay | Admitting: Family Medicine

## 2013-12-18 ENCOUNTER — Other Ambulatory Visit (HOSPITAL_COMMUNITY): Payer: Self-pay | Admitting: Physical Medicine and Rehabilitation

## 2013-12-18 DIAGNOSIS — M545 Low back pain, unspecified: Secondary | ICD-10-CM

## 2013-12-25 ENCOUNTER — Ambulatory Visit (HOSPITAL_COMMUNITY)
Admission: RE | Admit: 2013-12-25 | Discharge: 2013-12-25 | Disposition: A | Discharge status: Home / Self Care | Payer: 59 | Source: Ambulatory Visit | Attending: Physical Medicine and Rehabilitation | Admitting: Physical Medicine and Rehabilitation

## 2013-12-25 DIAGNOSIS — M47817 Spondylosis without myelopathy or radiculopathy, lumbosacral region: Secondary | ICD-10-CM | POA: Insufficient documentation

## 2013-12-25 DIAGNOSIS — M545 Low back pain, unspecified: Secondary | ICD-10-CM

## 2013-12-25 DIAGNOSIS — M5126 Other intervertebral disc displacement, lumbar region: Secondary | ICD-10-CM | POA: Insufficient documentation

## 2013-12-30 ENCOUNTER — Ambulatory Visit (HOSPITAL_COMMUNITY): Payer: 59

## 2014-01-12 ENCOUNTER — Other Ambulatory Visit: Payer: Self-pay | Admitting: Family Medicine

## 2014-02-02 ENCOUNTER — Other Ambulatory Visit (HOSPITAL_COMMUNITY): Payer: Self-pay | Admitting: Orthopedic Surgery

## 2014-02-02 DIAGNOSIS — R1031 Right lower quadrant pain: Secondary | ICD-10-CM

## 2014-02-02 DIAGNOSIS — M48061 Spinal stenosis, lumbar region without neurogenic claudication: Secondary | ICD-10-CM

## 2014-02-02 DIAGNOSIS — IMO0002 Reserved for concepts with insufficient information to code with codable children: Secondary | ICD-10-CM

## 2014-02-18 ENCOUNTER — Encounter (HOSPITAL_COMMUNITY)
Admission: RE | Admit: 2014-02-18 | Discharge: 2014-02-18 | Disposition: A | Discharge status: Home / Self Care | Payer: 59 | Source: Ambulatory Visit | Attending: Orthopedic Surgery | Admitting: Orthopedic Surgery

## 2014-02-18 DIAGNOSIS — R1031 Right lower quadrant pain: Secondary | ICD-10-CM

## 2014-02-18 DIAGNOSIS — R109 Unspecified abdominal pain: Secondary | ICD-10-CM | POA: Insufficient documentation

## 2014-02-18 MED ORDER — TECHNETIUM TC 99M MEDRONATE IV KIT
24.9000 | PACK | Freq: Once | INTRAVENOUS | Status: AC | PRN
  Administered 2014-02-18: 24.9 via INTRAVENOUS

## 2014-02-26 ENCOUNTER — Other Ambulatory Visit (HOSPITAL_COMMUNITY): Payer: Self-pay | Admitting: Orthopedic Surgery

## 2014-02-26 DIAGNOSIS — M25551 Pain in right hip: Secondary | ICD-10-CM

## 2014-03-05 ENCOUNTER — Ambulatory Visit (HOSPITAL_COMMUNITY)
Admission: RE | Admit: 2014-03-05 | Discharge: 2014-03-05 | Disposition: A | Discharge status: Home / Self Care | Payer: 59 | Source: Ambulatory Visit | Attending: Orthopedic Surgery | Admitting: Orthopedic Surgery

## 2014-03-05 DIAGNOSIS — M25551 Pain in right hip: Secondary | ICD-10-CM

## 2014-03-05 DIAGNOSIS — M25559 Pain in unspecified hip: Secondary | ICD-10-CM | POA: Insufficient documentation

## 2014-03-09 ENCOUNTER — Telehealth: Payer: Self-pay | Admitting: Family Medicine

## 2014-03-09 ENCOUNTER — Other Ambulatory Visit (INDEPENDENT_AMBULATORY_CARE_PROVIDER_SITE_OTHER): Payer: 59

## 2014-03-09 DIAGNOSIS — E7800 Pure hypercholesterolemia, unspecified: Secondary | ICD-10-CM

## 2014-03-09 DIAGNOSIS — R740 Nonspecific elevation of levels of transaminase and lactic acid dehydrogenase [LDH]: Secondary | ICD-10-CM

## 2014-03-09 DIAGNOSIS — I10 Essential (primary) hypertension: Secondary | ICD-10-CM

## 2014-03-09 DIAGNOSIS — R7402 Elevation of levels of lactic acid dehydrogenase (LDH): Secondary | ICD-10-CM

## 2014-03-09 DIAGNOSIS — R7401 Elevation of levels of liver transaminase levels: Secondary | ICD-10-CM

## 2014-03-09 LAB — LIPID PANEL
Cholesterol: 160 mg/dL (ref 0–200)
HDL: 66.1 mg/dL (ref >39.00)
LDL Cholesterol: 72 mg/dL (ref 0–99)
NonHDL: 93.9
Total CHOL/HDL Ratio: 2
Triglycerides: 111 mg/dL (ref 0.0–149.0)
VLDL: 22.2 mg/dL (ref 0.0–40.0)

## 2014-03-09 LAB — COMPREHENSIVE METABOLIC PANEL
ALT: 23 U/L (ref 0–35)
AST: 29 U/L (ref 0–37)
Albumin: 4.1 g/dL (ref 3.5–5.2)
Alkaline Phosphatase: 72 U/L (ref 39–117)
BUN: 18 mg/dL (ref 6–23)
CO2: 31 mEq/L (ref 19–32)
Calcium: 9.4 mg/dL (ref 8.4–10.5)
Chloride: 102 mEq/L (ref 96–112)
Creatinine, Ser: 0.8 mg/dL (ref 0.4–1.2)
GFR: 82.61 mL/min (ref >60.00)
Glucose, Bld: 77 mg/dL (ref 70–99)
Potassium: 3.6 mEq/L (ref 3.5–5.1)
Sodium: 140 mEq/L (ref 135–145)
Total Bilirubin: 0.4 mg/dL (ref 0.2–1.2)
Total Protein: 7 g/dL (ref 6.0–8.3)

## 2014-03-09 NOTE — Telephone Encounter (Signed)
Message copied by Jinny Sanders on Tue Mar 09, 2014  8:31 AM ------      Message from: Ellamae Sia      Created: Tue Mar 02, 2014 12:56 PM      Regarding: Lab orders for Tuesday, 8.4.15       Patient is scheduled for CPX labs, please order future labs, Thanks , Terri       ------

## 2014-03-10 ENCOUNTER — Telehealth: Payer: Self-pay | Admitting: Family Medicine

## 2014-03-10 NOTE — Telephone Encounter (Signed)
Relevant patient education assigned to patient using Emmi. ° °

## 2014-03-11 ENCOUNTER — Other Ambulatory Visit (HOSPITAL_COMMUNITY): Payer: Self-pay | Admitting: Orthopedic Surgery

## 2014-03-11 DIAGNOSIS — M8950 Osteolysis, unspecified site: Secondary | ICD-10-CM

## 2014-03-11 MED ORDER — GADOBENATE DIMEGLUMINE 529 MG/ML IV SOLN
15.0000 mL | Freq: Once | INTRAVENOUS | Status: AC | PRN
  Administered 2014-03-11: 15 mL via INTRAVENOUS

## 2014-03-16 ENCOUNTER — Ambulatory Visit (HOSPITAL_COMMUNITY)
Admission: RE | Admit: 2014-03-16 | Discharge: 2014-03-16 | Disposition: A | Discharge status: Home / Self Care | Payer: 59 | Source: Ambulatory Visit | Attending: Orthopedic Surgery | Admitting: Orthopedic Surgery

## 2014-03-16 ENCOUNTER — Encounter: Payer: Self-pay | Admitting: Family Medicine

## 2014-03-16 ENCOUNTER — Ambulatory Visit (INDEPENDENT_AMBULATORY_CARE_PROVIDER_SITE_OTHER): Payer: 59 | Admitting: Family Medicine

## 2014-03-16 VITALS — BP 130/78 | HR 96 | Temp 98.3°F | Ht 67.5 in | Wt 156.5 lb

## 2014-03-16 DIAGNOSIS — Z96649 Presence of unspecified artificial hip joint: Secondary | ICD-10-CM | POA: Insufficient documentation

## 2014-03-16 DIAGNOSIS — I10 Essential (primary) hypertension: Secondary | ICD-10-CM

## 2014-03-16 DIAGNOSIS — M8950 Osteolysis, unspecified site: Secondary | ICD-10-CM

## 2014-03-16 DIAGNOSIS — M899 Disorder of bone, unspecified: Secondary | ICD-10-CM | POA: Insufficient documentation

## 2014-03-16 DIAGNOSIS — M949 Disorder of cartilage, unspecified: Principal | ICD-10-CM

## 2014-03-16 DIAGNOSIS — Z23 Encounter for immunization: Secondary | ICD-10-CM

## 2014-03-16 DIAGNOSIS — Z0000 Encounter for general adult medical examination without abnormal findings: Secondary | ICD-10-CM

## 2014-03-16 DIAGNOSIS — E7800 Pure hypercholesterolemia, unspecified: Secondary | ICD-10-CM

## 2014-03-16 NOTE — Addendum Note (Signed)
Addended by: Emelia Salisbury C on: 03/16/2014 11:57 AM   Modules accepted: Orders

## 2014-03-16 NOTE — Progress Notes (Signed)
HPI  The patient is here for annual wellness exam and preventative care.   Recent diagnosis of grade 1 endometrial cancer She then underwent a RTLH BSO BPLND on 10/09/34 without complications. Her final pathologic diagnosis is a Stage 1A Grade 1 endometrioid endometrial cancer with no lymphovascular space invasion, no myometrial invasion and negative lymph nodes.  GYN ONC: Dr. Syble Creek.. Followed every 6 months.  GYN is Dr. Nori Riis   She has been seeing Ortho for deep quad pain, low back pain.Clarnce Flock Dr. Eddie Dibbles.Had 3 ESI with some improvement. Dr. Lynann Bologna.  Also with right  hip and groin pain.  Has right hip replacement.  Had back, hip  MRI, bone scan.: saw some osteolysis of acetabulum  Has scheduled CT scan today She may need a hip revision.   She has daily 6/10 pain on pain scale.  Using vicoprofen for pain.  Sleeping well at night  On effexor for menopausal syndrome. Now of hormones.  Has also helped her mood.   Wt Readings from Last 3 Encounters:  03/16/14 156 lb 8 oz (70.988 kg)  05/21/13 156 lb 11.2 oz (71.079 kg)  05/15/13 155 lb 12 oz (70.648 kg)   Hypertension: Well controlled on HCTZ.  BP Readings from Last 3 Encounters:  03/16/14 130/78  05/21/13 145/78  05/15/13 146/90  Using medication without problems or lightheadedness: None  Chest pain with exertion:None  Edema:None  Short of breath:None  Average home BPs: not checking lately Other issues: palpitation resolved with Stopping caffeine   Elevated Cholesterol: Well controlled on lipitor 10 mg daily  Lab Results  Component Value Date   CHOL 160 03/09/2014   HDL 66.10 03/09/2014   LDLCALC 72 03/09/2014   TRIG 111.0 03/09/2014   CHOLHDL 2 03/09/2014  Using medications without problems:None  Muscle aches: None  Diet compliance:Good.  Exercise:Walking slowly 1 hour a day. Other complaints:   Hx of Mild intermittant asthma: stable, last used inhaler in 07/2011. Treated for bacterial bronchitis. Last 2006 pneumonia vaccine    LFTS nml this year.  Urinary incontinence, stress: Seen Dr. Consuella Lose ... Improved with 8 weeks of PT. She has noted a lot of improvement in this issue following surgery.   Review of Systems  Constitutional: Negative for fever, fatigue and unexpected weight change.  HENT: Negative for ear pain, congestion, sore throat, sneezing, trouble swallowing and sinus pressure.  Eyes: Negative for pain and itching.  Respiratory: Negative for cough, shortness of breath and wheezing.  Cardiovascular: Negative for chest pain, palpitations and leg swelling.  Gastrointestinal: Negative for nausea, abdominal pain, diarrhea, constipation and blood in stool.  Genitourinary: Negative for dysuria, hematuria, vaginal discharge and difficulty urinating.  Skin: Negative for rash.  Neurological: Negative for syncope, weakness, light-headedness, numbness and headaches.  Psychiatric/Behavioral: Negative for confusion and dysphoric mood. The patient is not nervous/anxious.  Objective:   Physical Exam  Constitutional: Vital signs are normal. She appears well-developed and well-nourished. She is cooperative. Non-toxic appearance. She does not appear ill. No distress.  HENT:  Head: Normocephalic.  Right Ear: Hearing, tympanic membrane, external ear and ear canal normal.  Left Ear: Hearing, tympanic membrane, external ear and ear canal normal.  Nose: Nose normal.  Eyes: Conjunctivae, EOM and lids are normal. Pupils are equal, round, and reactive to light. No foreign bodies found.  Neck: Trachea normal and normal range of motion. Neck supple. Carotid bruit is not present. No mass and no thyromegaly present.  Cardiovascular: Normal rate, regular rhythm, S1 normal, S2 normal, normal  heart sounds and intact distal pulses. Exam reveals no gallop.  No murmur heard.  Pulmonary/Chest: Effort normal and breath sounds normal. No respiratory distress. She has no wheezes. She has no rhonchi. She has no rales.  Abdominal: Soft.  Normal appearance and bowel sounds are normal. She exhibits no distension, no fluid wave, no abdominal bruit and no mass. There is no hepatosplenomegaly. There is no tenderness. There is no rebound, no guarding and no CVA tenderness. No hernia.  Healing scars from laproscopic surgery. GYN: breast exam normal bilaterally, pelvic performed at GYN. Lymphadenopathy:  She has no cervical adenopathy.  She has no axillary adenopathy.  Neurological: She is alert. She has normal strength. No cranial nerve deficit or sensory deficit.  Skin: Skin is warm, dry and intact. No rash noted.  Psychiatric: Her speech is normal and behavior is normal. Judgment normal. Her mood appears not anxious. Cognition and memory are normal. She does not exhibit a depressed mood.  Assessment & Plan:   The patient's preventative maintenance and recommended screening tests for an annual wellness exam were reviewed in full today.  Brought up to date unless services declined.  Counselled on the importance of diet, exercise, and its role in overall health and mortality.  The patient's FH and SH was reviewed, including their home life, tobacco status, and drug and alcohol status.   Vaccines:uptodate with Td,zoster, PNA .Marland Kitchen Due for prevnar Nonsmoker  Mammo: nml in 08/2013 DVE/PAP: Sees Dr. Nori Riis for GYN .  nml pap pelvic 12/2013 Colon:Colonoscopy Result Date: 07/06/2010  Colonoscopy Result: 5 polyps  Colonoscopy Next Due: 5 yr  DEXA:12/2009 nml due for repeat in 5 years.

## 2014-03-16 NOTE — Patient Instructions (Signed)
As able get back to increase exercsie.  Continue healthy eating.

## 2014-03-16 NOTE — Progress Notes (Signed)
Pre visit review using our clinic review tool, if applicable. No additional management support is needed unless otherwise documented below in the visit note. 

## 2014-04-14 ENCOUNTER — Other Ambulatory Visit: Payer: Self-pay | Admitting: Family Medicine

## 2014-06-09 ENCOUNTER — Ambulatory Visit: Payer: 59 | Attending: Gynecologic Oncology | Admitting: Gynecologic Oncology

## 2014-06-09 ENCOUNTER — Encounter: Payer: Self-pay | Admitting: Gynecologic Oncology

## 2014-06-09 VITALS — BP 153/88 | HR 87 | Temp 98.0°F | Resp 16 | Ht 68.0 in | Wt 158.5 lb

## 2014-06-09 DIAGNOSIS — Z90710 Acquired absence of both cervix and uterus: Secondary | ICD-10-CM | POA: Diagnosis not present

## 2014-06-09 DIAGNOSIS — F1099 Alcohol use, unspecified with unspecified alcohol-induced disorder: Secondary | ICD-10-CM | POA: Insufficient documentation

## 2014-06-09 DIAGNOSIS — I10 Essential (primary) hypertension: Secondary | ICD-10-CM | POA: Diagnosis not present

## 2014-06-09 DIAGNOSIS — C541 Malignant neoplasm of endometrium: Secondary | ICD-10-CM | POA: Diagnosis present

## 2014-06-09 DIAGNOSIS — Z8052 Family history of malignant neoplasm of bladder: Secondary | ICD-10-CM | POA: Insufficient documentation

## 2014-06-09 DIAGNOSIS — Z79899 Other long term (current) drug therapy: Secondary | ICD-10-CM | POA: Diagnosis not present

## 2014-06-09 DIAGNOSIS — Z800 Family history of malignant neoplasm of digestive organs: Secondary | ICD-10-CM | POA: Insufficient documentation

## 2014-06-09 DIAGNOSIS — Z90722 Acquired absence of ovaries, bilateral: Secondary | ICD-10-CM | POA: Diagnosis not present

## 2014-06-09 DIAGNOSIS — Z8042 Family history of malignant neoplasm of prostate: Secondary | ICD-10-CM | POA: Diagnosis not present

## 2014-06-09 MED ORDER — ESTROGENS CONJUGATED 0.3 MG PO TABS: 0.3000 mg | ORAL_TABLET | Freq: Every day | ORAL | Status: DC

## 2014-06-09 NOTE — Patient Instructions (Signed)
Follow-up with Dr. Alycia Rossetti in one year and with Dr. Nori Riis in 6 months.

## 2014-06-09 NOTE — Progress Notes (Signed)
Consult Note: Gyn-Onc  Christina Griffith 63 y.o. female  CC:  Chief Complaint  Patient presents with  . Endometrial Cancer    HPI: Christina Griffith is a 63 y.o. year old iinitially seen in consultation for grade 1 endometrial cancer She then underwent a [place] BSO BPLND on 10/10/60 without complications. Her final pathologic diagnosis is a Stage 1A Grade 1 endometrioid endometrial cancer with no lymphovascular space invasion, no myometrial invasion and negative lymph nodes.  Interval History:  I last saw her in October 2014. In the interim she saw Dr. Nori Riis and had a negative exam and negative Pap smear in May. Starting February spelled having increasing issues with her hip and back. She has ostial lysis of the right acetabulum and femur felt to be secondary to the plastic in her replaced hip device. She also had a disc issue in his had steroid injections to her back. She is doing home exercises and stretching. She was placed on Fosamax 8 weeks ago and is seeing her orthopedic surgeon again tomorrow. She was taking narcotic pain medications and is not sure if this is with contributed to some constipation and some left lower quadrant discomfort that she has had. She's had no vaginal bleeding. She is interested in weaning herself off of her hormone replacement therapy. She does believe she is driving benefit from the Effexor. She is up-to-date on her mammograms. She had her last one in January 2015. She's due for colonoscopy in 2016. There are no new medical problems and her family.  Review of Systems  Constitutional: Denies fever. Skin: No rash Cardiovascular: No chest pain, shortness of breath, or edema  Pulmonary: No cough Gastro Intestinal:  No nausea, vomiting, mild constipation, or diarrhea reported. No bright red blood per rectum or change in bowel movement.  Genitourinary: No frequency, urgency, or dysuria.  Denies vaginal bleeding and discharge.  Musculoskeletal: + right hip  pain Psychology: Improved with effexor.  Current Meds:  Outpatient Encounter Prescriptions as of 06/09/2014  Medication Sig  . alendronate (FOSAMAX) 70 MG tablet   . atorvastatin (LIPITOR) 10 MG tablet TAKE 1 TABLET BY MOUTH ONCE DAILY  . Calcium Carbonate-Vitamin D (CALCIUM 600+D HIGH POTENCY) 600-400 MG-UNIT per tablet Take 1 tablet by mouth daily.  Marland Kitchen estrogens, conjugated, (PREMARIN) 0.3 MG tablet Take 1 tablet (0.3 mg total) by mouth daily. Take daily for 21 days then do not take for 7 days.  . hydrochlorothiazide (HYDRODIURIL) 25 MG tablet TAKE 1 TABLET BY MOUTH DAILY  . ibuprofen (ADVIL,MOTRIN) 200 MG tablet Take 600 mg by mouth every 6 (six) hours as needed for pain.  . magnesium gluconate (MAGONATE) 500 MG tablet Take 500 mg by mouth daily.  . Multiple Vitamin (MULTIVITAMIN) tablet Take 1 tablet by mouth daily.  . potassium gluconate 595 MG TABS Take 595 mg by mouth daily.  . Triamcinolone Acetonide (NASACORT ALLERGY 24HR NA) Place into the nose as needed.  . venlafaxine XR (EFFEXOR-XR) 75 MG 24 hr capsule Take 1 capsule by mouth at bedtime.    Allergy: No Known Allergies  Social Hx:   History   Social History  . Marital Status: Single    Spouse Name: N/A    Number of Children: N/A  . Years of Education: N/A   Occupational History  . nurse practioner    Social History Main Topics  . Smoking status: Never Smoker   . Smokeless tobacco: Never Used  . Alcohol Use: Yes     Comment: 3  x a week  . Drug Use: No  . Sexual Activity: Not on file   Other Topics Concern  . Not on file   Social History Narrative   Regular exercise- yes, 5-6 days a week, yoga   Diet: healthy    Past Surgical Hx:  Past Surgical History  Procedure Laterality Date  . Total hip arthroplasty      right 2002 left 2004  . Breast surgery      breast biopsy  . Hysteroscopy w/d&c Left 1/24/204    w/ resection of Endometrial polyps  . Robotic assisted total hysterectomy with bilateral salpingo  oopherectomy N/A 10/07/2012    Procedure: ROBOTIC ASSISTED TOTAL HYSTERECTOMY WITH BILATERAL SALPINGO OOPHORECTOMY/POSSIBLE LYMPH NODE BIOPSY;  Surgeon: Imagene Gurney A. Alycia Rossetti, MD;  Location: [facility];  Service: Gynecology;  Laterality: N/A;    Past Medical Hx:  Past Medical History  Diagnosis Date  . Endometrial ca   . Asthma   . Hypertension   . Cold 10/03/12    pt getting over a cold--still has some head congestion, slight cough  . Arthritis     spinal stenosis--back pain--hx of herniated cervical disk- but no surgery  . H/O: hematuria     benign essential hematuria - per urology work up     Oncology Hx:    Endometrial cancer   10/07/2012 Surgery RTH/BSO adn BPLND. IA grade 1    Family Hx:  Family History  Problem Relation Age of Onset  . Hypertension Mother   . Cancer Father     bladder and prostate  . Hyperlipidemia Father   . Cancer Maternal Grandmother     colon  . Breast cancer Cousin     Vitals:  Blood pressure 153/88, pulse 87, temperature 98 F (36.7 C), temperature source Oral, resp. rate 16, height 5\' 8"  (1.727 m), weight 158 lb 8 oz (71.895 kg).  Physical Exam: Well-nourished well-developed female in no acute distress.  Neck: Supple, no lymphadenopathy no thyromegaly.   Lungs: Clear to auscultation.  Cardiovascular: Regular rate and rhythm.  Abdomen: Well-healed incisions. Abdomen is soft, nontender, nondistended. There are no palpable masses or hepatosplenomegaly.  Extremities: No edema.  Groins: No lymphadenopathy.  Pelvic: Normal external female genitalia. Vagina slightly atrophic. There are no visible lesions. Bimanual examination reveals no masses or nodularity. Rectal confirms  Assessment/Plan: 63 y.o. year old with Stage IA Grade 1 endometrioid endometrial cancer.   Plan:  1) We will work with her on weaning her off her estrogen replacement therapy. She'll take her 0.3 mg of Premarin every other day for 2 weeks and then every third day and hopefully  she can discontinue at that point. She will call us if she has issues with this.  2) She will continue her Effexor. 3) She'll follow-up with her physicians as scheduled.  4) She will follow up with Dr. Nori Riis in 6 months and return to see [place] in one year.  Jocelynne Duquette A., MD 06/09/2014, 10:38 AM

## 2014-07-16 ENCOUNTER — Other Ambulatory Visit (HOSPITAL_COMMUNITY): Payer: Self-pay | Admitting: Orthopedic Surgery

## 2014-07-16 DIAGNOSIS — M25551 Pain in right hip: Secondary | ICD-10-CM

## 2014-07-20 ENCOUNTER — Ambulatory Visit (HOSPITAL_COMMUNITY)
Admission: RE | Admit: 2014-07-20 | Discharge: 2014-07-20 | Disposition: A | Discharge status: Home / Self Care | Payer: 59 | Source: Ambulatory Visit | Attending: Orthopedic Surgery | Admitting: Orthopedic Surgery

## 2014-07-20 DIAGNOSIS — M25551 Pain in right hip: Secondary | ICD-10-CM | POA: Diagnosis present

## 2014-09-10 ENCOUNTER — Encounter: Payer: Self-pay | Admitting: Family Medicine

## 2014-10-13 ENCOUNTER — Other Ambulatory Visit: Payer: Self-pay | Admitting: Family Medicine

## 2014-10-27 ENCOUNTER — Encounter: Payer: Self-pay | Admitting: Family Medicine

## 2014-10-27 MED ORDER — ALBUTEROL SULFATE HFA 108 (90 BASE) MCG/ACT IN AERS: 2.0000 | INHALATION_SPRAY | Freq: Four times a day (QID) | RESPIRATORY_TRACT | Status: DC | PRN

## 2014-11-30 ENCOUNTER — Other Ambulatory Visit: Payer: Self-pay | Admitting: Obstetrics & Gynecology

## 2014-12-03 LAB — CYTOLOGY - PAP

## 2014-12-09 ENCOUNTER — Other Ambulatory Visit (HOSPITAL_COMMUNITY): Payer: Self-pay | Admitting: Orthopedic Surgery

## 2014-12-09 DIAGNOSIS — M25551 Pain in right hip: Secondary | ICD-10-CM

## 2014-12-14 ENCOUNTER — Ambulatory Visit (HOSPITAL_COMMUNITY)
Admission: RE | Admit: 2014-12-14 | Discharge: 2014-12-14 | Disposition: A | Discharge status: Home / Self Care | Payer: 59 | Source: Ambulatory Visit | Attending: Orthopedic Surgery | Admitting: Orthopedic Surgery

## 2014-12-14 ENCOUNTER — Encounter (HOSPITAL_COMMUNITY): Payer: Self-pay

## 2014-12-14 DIAGNOSIS — M25551 Pain in right hip: Secondary | ICD-10-CM | POA: Insufficient documentation

## 2015-01-04 DIAGNOSIS — H2512 Age-related nuclear cataract, left eye: Secondary | ICD-10-CM | POA: Diagnosis not present

## 2015-01-04 DIAGNOSIS — H18411 Arcus senilis, right eye: Secondary | ICD-10-CM | POA: Diagnosis not present

## 2015-01-04 DIAGNOSIS — H02839 Dermatochalasis of unspecified eye, unspecified eyelid: Secondary | ICD-10-CM | POA: Diagnosis not present

## 2015-01-04 DIAGNOSIS — H2511 Age-related nuclear cataract, right eye: Secondary | ICD-10-CM | POA: Diagnosis not present

## 2015-01-11 DIAGNOSIS — M25551 Pain in right hip: Secondary | ICD-10-CM | POA: Diagnosis not present

## 2015-02-04 HISTORY — PX: EYE SURGERY: SHX253

## 2015-02-14 DIAGNOSIS — H2512 Age-related nuclear cataract, left eye: Secondary | ICD-10-CM | POA: Diagnosis not present

## 2015-02-14 DIAGNOSIS — H25812 Combined forms of age-related cataract, left eye: Secondary | ICD-10-CM | POA: Diagnosis not present

## 2015-02-15 DIAGNOSIS — H2511 Age-related nuclear cataract, right eye: Secondary | ICD-10-CM | POA: Diagnosis not present

## 2015-02-28 DIAGNOSIS — H25811 Combined forms of age-related cataract, right eye: Secondary | ICD-10-CM | POA: Diagnosis not present

## 2015-02-28 DIAGNOSIS — H2511 Age-related nuclear cataract, right eye: Secondary | ICD-10-CM | POA: Diagnosis not present

## 2015-03-21 ENCOUNTER — Telehealth: Payer: Self-pay | Admitting: Family Medicine

## 2015-03-21 DIAGNOSIS — E7800 Pure hypercholesterolemia, unspecified: Secondary | ICD-10-CM

## 2015-03-21 NOTE — Telephone Encounter (Signed)
-----   Message from Terri J Walsh sent at 03/16/2015  2:46 PM EDT ----- Regarding: Lab orders for Monday, 8.15.16 Patient is scheduled for CPX labs, please order future labs, Thanks , Terri  

## 2015-03-22 ENCOUNTER — Other Ambulatory Visit (INDEPENDENT_AMBULATORY_CARE_PROVIDER_SITE_OTHER): Payer: 59

## 2015-03-22 DIAGNOSIS — E7800 Pure hypercholesterolemia, unspecified: Secondary | ICD-10-CM

## 2015-03-22 LAB — LIPID PANEL
Cholesterol: 175 mg/dL (ref 0–200)
HDL: 60.4 mg/dL (ref >39.00)
LDL Cholesterol: 87 mg/dL (ref 0–99)
NonHDL: 114.36
Total CHOL/HDL Ratio: 3
Triglycerides: 137 mg/dL (ref 0.0–149.0)
VLDL: 27.4 mg/dL (ref 0.0–40.0)

## 2015-03-22 LAB — COMPREHENSIVE METABOLIC PANEL
ALT: 21 U/L (ref 0–35)
AST: 24 U/L (ref 0–37)
Albumin: 4 g/dL (ref 3.5–5.2)
Alkaline Phosphatase: 65 U/L (ref 39–117)
BUN: 16 mg/dL (ref 6–23)
CO2: 34 mEq/L — ABNORMAL HIGH (ref 19–32)
Calcium: 9.5 mg/dL (ref 8.4–10.5)
Chloride: 101 mEq/L (ref 96–112)
Creatinine, Ser: 0.68 mg/dL (ref 0.40–1.20)
GFR: 92.2 mL/min (ref >60.00)
Glucose, Bld: 91 mg/dL (ref 70–99)
Potassium: 3.7 mEq/L (ref 3.5–5.1)
Sodium: 140 mEq/L (ref 135–145)
Total Bilirubin: 0.3 mg/dL (ref 0.2–1.2)
Total Protein: 6.9 g/dL (ref 6.0–8.3)

## 2015-03-29 ENCOUNTER — Other Ambulatory Visit: Payer: Self-pay | Admitting: Orthopedic Surgery

## 2015-03-29 ENCOUNTER — Ambulatory Visit (INDEPENDENT_AMBULATORY_CARE_PROVIDER_SITE_OTHER): Payer: 59 | Admitting: Family Medicine

## 2015-03-29 ENCOUNTER — Encounter: Payer: Self-pay | Admitting: Family Medicine

## 2015-03-29 VITALS — BP 130/70 | HR 83 | Temp 98.3°F | Ht 67.5 in | Wt 159.5 lb

## 2015-03-29 DIAGNOSIS — E7800 Pure hypercholesterolemia, unspecified: Secondary | ICD-10-CM

## 2015-03-29 DIAGNOSIS — Z0000 Encounter for general adult medical examination without abnormal findings: Secondary | ICD-10-CM | POA: Diagnosis not present

## 2015-03-29 DIAGNOSIS — I10 Essential (primary) hypertension: Secondary | ICD-10-CM

## 2015-03-29 DIAGNOSIS — Z7189 Other specified counseling: Secondary | ICD-10-CM | POA: Insufficient documentation

## 2015-03-29 NOTE — Assessment & Plan Note (Signed)
Well controlled. Continue current medication.  

## 2015-03-29 NOTE — Progress Notes (Signed)
Pre visit review using our clinic review tool, if applicable. No additional management support is needed unless otherwise documented below in the visit note. 

## 2015-03-29 NOTE — Progress Notes (Signed)
The patient is here for annual wellness exam and preventative care.   Grade 1 endometrial cancer She then underwent a [place] BSO BPLND on 4/0/08 without complications. Her final pathologic diagnosis is a Stage 1A Grade 1 endometrioid endometrial cancer with no lymphovascular space invasion, no myometrial invasion and negative lymph nodes.  GYN ONC: Dr. Syble Creek.. Followed every 6 months.  GYN is Dr. Nori Riis   On effexor for menopausal syndrome. SE off hormones, now back to once a day. Has also helped her mood.   Wt Readings from Last 3 Encounters:  03/29/15 159 lb 8 oz (72.349 kg)  06/09/14 158 lb 8 oz (71.895 kg)  03/16/14 156 lb 8 oz (70.988 kg)   Hypertension: Well controlled on HCTZ.  BP Readings from Last 3 Encounters:  03/29/15 130/70  06/09/14 153/88  03/16/14 130/78  Using medication without problems or lightheadedness: None  Chest pain with exertion:None  Edema:None  Short of breath:None  Average home BPs: not checking lately Other issues: palpitation resolved with stopping caffeine   Elevated Cholesterol: Well controlled on lipitor 10 mg daily  Lab Results  Component Value Date   CHOL 175 03/22/2015   HDL 60.40 03/22/2015   LDLCALC 87 03/22/2015   TRIG 137.0 03/22/2015   CHOLHDL 3 03/22/2015  Using medications without problems:None  Muscle aches: None  Diet compliance:Good.  Exercise:Walking  1 hour a day. Other complaints:   Hx of Mild intermittant asthma: stable, last used inhaler in 07/2011. Treated for bacterial bronchitis. Last 2006 pneumonia vaccine   LFTS nml this year.  Urinary incontinence, stress: Seen Dr. Consuella Lose ... Improved with 8 weeks of PT. She has noted a lot of improvement in this issue following surgery.  She continues pelvic wall exercises.  05/09/2015 Dr. Mayer Camel plans surgery for osteolysis of bone, plan revision on right hip  Review of Systems  Constitutional: Negative for fever, fatigue and unexpected weight change.  HENT:  Negative for ear pain, congestion, sore throat, sneezing, trouble swallowing and sinus pressure.  Eyes: Negative for pain and itching.  Respiratory: Negative for cough, shortness of breath and wheezing.  Cardiovascular: Negative for chest pain, palpitations and leg swelling.  Gastrointestinal: Negative for nausea, abdominal pain, diarrhea, has  Constipation treats with fiber, stool softner and miralax. and  No blood in stool.  Genitourinary: Negative for dysuria, hematuria, vaginal discharge and difficulty urinating.  Skin: Negative for rash.  Neurological: Negative for syncope, weakness, light-headedness, numbness and headaches.  Psychiatric/Behavioral: Negative for confusion and dysphoric mood. The patient is not nervous/anxious.  Objective:   Physical Exam  Constitutional: Vital signs are normal. She appears well-developed and well-nourished. She is cooperative. Non-toxic appearance. She does not appear ill. No distress.  HENT:  Head: Normocephalic.  Right Ear: Hearing, tympanic membrane, external ear and ear canal normal.  Left Ear: Hearing, tympanic membrane, external ear and ear canal normal.  Nose: Nose normal.  Eyes: Conjunctivae, EOM and lids are normal. Pupils are equal, round, and reactive to light. No foreign bodies found.  Neck: Trachea normal and normal range of motion. Neck supple. Carotid bruit is not present. No mass and no thyromegaly present.  Cardiovascular: Normal rate, regular rhythm, S1 normal, S2 normal, normal heart sounds and intact distal pulses. Exam reveals no gallop.  No murmur heard.  Pulmonary/Chest: Effort normal and breath sounds normal. No respiratory distress. She has no wheezes. She has no rhonchi. She has no rales.  Abdominal: Soft. Normal appearance and bowel sounds are normal. She exhibits no distension,  no fluid wave, no abdominal bruit and no mass. There is no hepatosplenomegaly. There is no tenderness. There is no rebound, no  guarding and no CVA tenderness. No hernia.  Healing scars from laproscopic surgery. GYN: breast exam normal bilaterally, pelvic performed at GYN. Lymphadenopathy:  She has no cervical adenopathy.  She has no axillary adenopathy.  Neurological: She is alert. She has normal strength. No cranial nerve deficit or sensory deficit.  Skin: Skin is warm, dry and intact. No rash noted.  Psychiatric: Her speech is normal and behavior is normal. Judgment normal. Her mood appears not anxious. Cognition and memory are normal. She does not exhibit a depressed mood.  Assessment & Plan:   The patient's preventative maintenance and recommended screening tests for an annual wellness exam were reviewed in full today.  Brought up to date unless services declined.  Counselled on the importance of diet, exercise, and its role in overall health and mortality.  The patient's FH and SH was reviewed, including their home life, tobacco status, and drug and alcohol status.   Vaccines:uptodate with Td,zoster, PNA, prevnar. Will get flu at work. Nonsmoker  Mammo: nml in 09/2014 DVE/PAP: Sees Dr. Rogelio Seen for GYN ONC . nml pap pelvic 12/2014 Colon:Colonoscopy Result Date: 07/06/2010  Colonoscopy Result: 5 polyps  Colonoscopy Next Due: 5 yr .Marland Kitchen Due 07/2015 DEXA:12/2009 nml due for repeat in 5 years.  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 SE of constipation. Stopped after 11 months  in July. Plans to discuss with Dr. Nori Riis GYN. HCV neg in 2014.

## 2015-03-29 NOTE — Patient Instructions (Addendum)
Keep on working on healthy eating and regular exercise.  Get flu vaccine at work.

## 2015-04-05 ENCOUNTER — Other Ambulatory Visit: Payer: Self-pay | Admitting: Family Medicine

## 2015-04-13 ENCOUNTER — Other Ambulatory Visit: Payer: Self-pay | Admitting: Family Medicine

## 2015-04-19 ENCOUNTER — Encounter: Payer: Self-pay | Admitting: Family Medicine

## 2015-04-27 NOTE — H&P (Signed)
TOTAL HIP REVISION ADMISSION H&P  Patient is admitted for right revision total hip arthroplasty.  Subjective:  Chief Complaint: right hip pain  HPI: Christina Griffith, 64 y.o. female, has a history of pain and functional disability in the right hip due to arthritis and patient has failed non-surgical conservative treatments for greater than 12 weeks to include NSAID's and/or analgesics, supervised PT with diminished ADL's post treatment, use of assistive devices, weight reduction as appropriate and activity modification. The indications for the revision total hip arthroplasty are bearing surface wear leading to  symptomatic synovitis and hip arthrofibrosis.  Onset of symptoms was gradual starting >10 years ago with gradually worsening course since that time.  Prior procedures on the right hip include arthroplasty.  Patient currently rates pain in the right hip at 10 out of 10 with activity.  There is night pain, worsening of pain with activity and weight bearing, pain that interfers with activities of daily living and pain with passive range of motion. 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  This condition presents safety issues increasing the risk of falls.   There is no current active infection.  Patient Active Problem List   Diagnosis Date Noted  . Counseling regarding end of life decision making 03/29/2015  . Endometrial cancer 10/08/2012  . HYPERCHOLESTEROLEMIA 09/15/2010  . ESSENTIAL HYPERTENSION, BENIGN 09/15/2010  . ALLERGIC RHINITIS, SEASONAL 09/15/2010  . ASTHMA, INTERMITTENT, MILD 09/15/2010  . GERD 09/15/2010  . DEGENERATIVE DISC DISEASE 09/15/2010  . SPINAL STENOSIS 09/15/2010  . URINARY INCONTINENCE, STRESS 09/15/2010  . DEPRESSION, SITUATIONAL, HX OF 09/15/2010  . COLONIC POLYPS, ADENOMATOUS, HX OF 09/15/2010  . HEMATURIA, MICROSCOPIC, HX OF 09/15/2010   Past  Medical History  Diagnosis Date  . Asthma   . Hypertension   . Cold 10/03/12    pt getting over a cold--still has some head congestion, slight cough  . Arthritis     spinal stenosis--back pain--hx of herniated cervical disk- but no surgery  . H/O: hematuria     benign essential hematuria - per urology work up   . Endometrial ca     Past Surgical History  Procedure Laterality Date  . Total hip arthroplasty      right 2002 left 2004  . Breast surgery      breast biopsy  . Hysteroscopy w/d&c Left 1/24/204    w/ resection of Endometrial polyps  . Robotic assisted total hysterectomy with bilateral salpingo oopherectomy N/A 10/07/2012    Procedure: ROBOTIC ASSISTED TOTAL HYSTERECTOMY WITH BILATERAL SALPINGO OOPHORECTOMY/POSSIBLE LYMPH NODE BIOPSY;  Surgeon: Imagene Gurney A. Alycia Rossetti, MD;  Location: [facility];  Service: Gynecology;  Laterality: N/A;    No prescriptions prior to admission   No Known Allergies  Social History  Substance Use Topics  . Smoking status: Never Smoker   . Smokeless tobacco: Never Used  . Alcohol Use: Yes     Comment: 3 x a week    Family History  Problem Relation Age of Onset  . Hypertension Mother   . Cancer Father     bladder and prostate  . Hyperlipidemia Father   . Cancer Maternal Grandmother     colon  . Breast cancer Cousin       Review of Systems  Constitutional: Negative.   HENT: Negative.   Eyes:       Cataracts  Respiratory: Negative.   Cardiovascular: Negative.   Gastrointestinal:  IBS  Genitourinary: Negative.   Musculoskeletal: Positive for myalgias and joint pain.  Skin: Negative.   Neurological: Negative.   Endo/Heme/Allergies: Negative.   Psychiatric/Behavioral: Negative.     Objective:  Physical Exam  Constitutional: She is oriented to person, place, and time. She appears well-developed and well-nourished.  HENT:  Head: Normocephalic and atraumatic.  Eyes: Pupils are equal, round, and reactive to light.  Neck: Normal range  of motion. Neck supple.  Cardiovascular: Intact distal pulses.   Respiratory: Effort normal.  Musculoskeletal: She exhibits tenderness.  she does have mild weakness with hip flexion and does have reduced internal rotation she has tenderness anteriorly and laterally.  She has brisk capillary refill and is neurovascularly intact distally.  Mild right buttock pain.  Neurological: She is alert and oriented to person, place, and time.  Skin: Skin is warm and dry.  Psychiatric: She has a normal mood and affect. Her behavior is normal. Judgment and thought content normal.    Vital signs in last 24 hours:     Labs:   Estimated body mass index is 24.26 kg/(m^2) as calculated from the following:   Height as of 06/09/14: 5\' 8"  (1.727 m).   Weight as of 03/29/15: 72.349 kg (159 lb 8 oz).  Imaging Review:  CT demonstrate probable reactive particle disease right total hip with ostial lysis in the greater trochanter and acetabular components  Assessment/Plan:  End stage arthritis, right hip(s) with failed previous arthroplasty.  The patient history, physical examination, clinical judgement of the provider and imaging studies are consistent with end stage degenerative joint disease of the right hip(s), previous total hip arthroplasty. Revision total hip arthroplasty is deemed medically necessary. The treatment options including medical management, injection therapy, arthroscopy and arthroplasty were discussed at length. The risks and benefits of total hip arthroplasty were presented and reviewed. The risks due to aseptic loosening, infection, stiffness, dislocation/subluxation,  thromboembolic complications and other imponderables were discussed.  The patient acknowledged the explanation, agreed to proceed with the plan and consent was signed. Patient is being admitted for inpatient treatment for surgery, pain control, PT, OT, prophylactic antibiotics, VTE prophylaxis, progressive ambulation and ADL's and  discharge planning. The patient is planning to be discharged home with home health services

## 2015-04-28 NOTE — Pre-Procedure Instructions (Signed)
    Christina Griffith  04/28/2015      [id number] - [place], [place] [address] [place] [address] Phone: [phone] Fax: [phone]  [place], [place] - [address] [place] [address] Phone: [phone] Fax: [phone]    Your procedure is scheduled on Monday, October 3.  Report to [facility] Admitting at 9:00 A.M.                 Your Surgery is scheduled for 11:00 A.M.   Call this number if you have problems the morning of surgery:[phone]               For any other questions, please call [phone], Monday - Friday 8 AM - 4 PM.   Remember:  Do not eat food or drink liquids after midnight Sunday, October 2nd.   Take these medicines the morning of surgery with A SIP OF WATER:  estrogens, conjugated, (PREMARIN) if due.  May use Albuterol Inhaler if needed and bring it to the hospital with you.             Take if needed:fexofenadine (ALLEGRA), NASACORT ALLERGY.                 On September 26 STOP taking: Vitamins, ibuprofen (ADVIL,MOTRIN). Do not take any Herbal Medications, Aspirin or Aspirin Products.   Do not wear jewelry, make-up or nail polish.               Do not wear lotions, powders, or perfumes.    Do not shave 48 hours prior to surgery.    Do not bring valuables to the hospital.   [facility] is not responsible for any belongings or valuables.  Contacts, dentures or bridgework may not be worn into surgery.  Leave your suitcase in the car.  After surgery it may be brought to your room.  For patients admitted to the hospital, discharge time will be determined by your treatment team.  Special instructions:  -  Please read over the following fact sheets that you were given. Pain Booklet, Coughing and Deep Breathing, Blood Transfusion Information and Surgical Site Infection Prevention and Incentive Spirometry.

## 2015-04-29 ENCOUNTER — Ambulatory Visit (HOSPITAL_COMMUNITY)
Admission: RE | Admit: 2015-04-29 | Discharge: 2015-04-29 | Disposition: A | Discharge status: Home / Self Care | Payer: 59 | Source: Ambulatory Visit | Attending: Orthopedic Surgery | Admitting: Orthopedic Surgery

## 2015-04-29 ENCOUNTER — Encounter (HOSPITAL_COMMUNITY): Payer: Self-pay

## 2015-04-29 ENCOUNTER — Encounter (HOSPITAL_COMMUNITY)
Admission: RE | Admit: 2015-04-29 | Discharge: 2015-04-29 | Disposition: A | Discharge status: Home / Self Care | Payer: 59 | Source: Ambulatory Visit | Attending: Orthopedic Surgery | Admitting: Orthopedic Surgery

## 2015-04-29 DIAGNOSIS — Z0183 Encounter for blood typing: Secondary | ICD-10-CM | POA: Insufficient documentation

## 2015-04-29 DIAGNOSIS — J45909 Unspecified asthma, uncomplicated: Secondary | ICD-10-CM | POA: Insufficient documentation

## 2015-04-29 DIAGNOSIS — I10 Essential (primary) hypertension: Secondary | ICD-10-CM | POA: Insufficient documentation

## 2015-04-29 DIAGNOSIS — Z01818 Encounter for other preprocedural examination: Secondary | ICD-10-CM | POA: Diagnosis not present

## 2015-04-29 DIAGNOSIS — Z01812 Encounter for preprocedural laboratory examination: Secondary | ICD-10-CM | POA: Diagnosis not present

## 2015-04-29 HISTORY — DX: Personal history of other mental and behavioral disorders: Z86.59

## 2015-04-29 HISTORY — DX: Stress incontinence (female) (male): N39.3

## 2015-04-29 HISTORY — DX: Presence of spectacles and contact lenses: Z97.3

## 2015-04-29 HISTORY — DX: Polyp of colon: K63.5

## 2015-04-29 HISTORY — DX: Anesthesia of skin: R20.0

## 2015-04-29 HISTORY — DX: Other specified postprocedural states: Z98.890

## 2015-04-29 HISTORY — DX: Nausea with vomiting, unspecified: R11.2

## 2015-04-29 HISTORY — DX: Personal history of pneumonia (recurrent): Z87.01

## 2015-04-29 LAB — BASIC METABOLIC PANEL
Anion gap: 7 (ref 5–15)
BUN: 13 mg/dL (ref 6–20)
CO2: 31 mmol/L (ref 22–32)
Calcium: 9.5 mg/dL (ref 8.9–10.3)
Chloride: 101 mmol/L (ref 101–111)
Creatinine, Ser: 0.71 mg/dL (ref 0.44–1.00)
GFR calc Af Amer: >60 mL/min (ref >60)
GFR calc non Af Amer: >60 mL/min (ref >60)
Glucose, Bld: 79 mg/dL (ref 65–99)
Potassium: 3.3 mmol/L — ABNORMAL LOW (ref 3.5–5.1)
Sodium: 139 mmol/L (ref 135–145)

## 2015-04-29 LAB — URINALYSIS, ROUTINE W REFLEX MICROSCOPIC
Bilirubin Urine: NEGATIVE
Glucose, UA: NEGATIVE mg/dL
Ketones, ur: NEGATIVE mg/dL
Leukocytes, UA: NEGATIVE
Nitrite: NEGATIVE
Protein, ur: NEGATIVE mg/dL
Specific Gravity, Urine: 1.015 (ref 1.005–1.030)
Urobilinogen, UA: 0.2 mg/dL (ref 0.0–1.0)
pH: 7 (ref 5.0–8.0)

## 2015-04-29 LAB — TYPE AND SCREEN
ABO/RH(D): O POS
Antibody Screen: NEGATIVE

## 2015-04-29 LAB — CBC WITH DIFFERENTIAL/PLATELET
Basophils Absolute: 0 10*3/uL (ref 0.0–0.1)
Basophils Relative: 0 %
Eosinophils Absolute: 0.2 10*3/uL (ref 0.0–0.7)
Eosinophils Relative: 3 %
HCT: 42.8 % (ref 36.0–46.0)
Hemoglobin: 14 g/dL (ref 12.0–15.0)
Lymphocytes Relative: 35 %
Lymphs Abs: 2.3 10*3/uL (ref 0.7–4.0)
MCH: 31 pg (ref 26.0–34.0)
MCHC: 32.7 g/dL (ref 30.0–36.0)
MCV: 94.7 fL (ref 78.0–100.0)
Monocytes Absolute: 0.4 10*3/uL (ref 0.1–1.0)
Monocytes Relative: 6 %
Neutro Abs: 3.8 10*3/uL (ref 1.7–7.7)
Neutrophils Relative %: 56 %
Platelets: 234 10*3/uL (ref 150–400)
RBC: 4.52 MIL/uL (ref 3.87–5.11)
RDW: 12.6 % (ref 11.5–15.5)
WBC: 6.7 10*3/uL (ref 4.0–10.5)

## 2015-04-29 LAB — URINE MICROSCOPIC-ADD ON

## 2015-04-29 LAB — PROTIME-INR
INR: 0.99 (ref 0.00–1.49)
Prothrombin Time: 13.3 seconds (ref 11.6–15.2)

## 2015-04-29 LAB — ABO/RH: ABO/RH(D): O POS

## 2015-04-29 LAB — SURGICAL PCR SCREEN
MRSA, PCR: POSITIVE — AB
Staphylococcus aureus: POSITIVE — AB

## 2015-04-29 LAB — APTT: aPTT: 28 seconds (ref 24–37)

## 2015-04-29 NOTE — Progress Notes (Signed)
Patient made aware of PCR results and verbalized understanding.   Prescription called into [facility].

## 2015-04-29 NOTE — Progress Notes (Signed)
PCP - Amy Bedsole Cardiologist - denies   EKG & CXR- 04/29/2015 - Epic  Echo/stress test/cardiac cath - denies  Patient denies chest pain and shortness of breath at PAT appointment.

## 2015-05-06 NOTE — Progress Notes (Signed)
Instructed patient to arrive at 800 am 05/09/15.

## 2015-05-08 DIAGNOSIS — T849XXA Unspecified complication of internal orthopedic prosthetic device, implant and graft, initial encounter: Secondary | ICD-10-CM | POA: Diagnosis present

## 2015-05-08 DIAGNOSIS — Z96641 Presence of right artificial hip joint: Secondary | ICD-10-CM | POA: Diagnosis present

## 2015-05-08 MED ORDER — CEFAZOLIN SODIUM-DEXTROSE 2-3 GM-% IV SOLR
2.0000 g | INTRAVENOUS | Status: AC
  Administered 2015-05-09: 2 g via INTRAVENOUS
  Filled 2015-05-08: qty 50

## 2015-05-08 MED ORDER — DEXTROSE-NACL 5-0.45 % IV SOLN: INTRAVENOUS | Status: DC

## 2015-05-08 MED ORDER — CHLORHEXIDINE GLUCONATE 4 % EX LIQD: 60.0000 mL | Freq: Once | CUTANEOUS | Status: DC

## 2015-05-09 ENCOUNTER — Inpatient Hospital Stay (HOSPITAL_COMMUNITY): Payer: 59 | Admitting: Anesthesiology

## 2015-05-09 ENCOUNTER — Inpatient Hospital Stay (HOSPITAL_COMMUNITY)
Admission: RE | Admit: 2015-05-09 | Discharge: 2015-05-11 | DRG: 467 | Disposition: A | Discharge status: Home Health | Payer: 59 | Source: Ambulatory Visit | Attending: Orthopedic Surgery | Admitting: Orthopedic Surgery

## 2015-05-09 ENCOUNTER — Encounter (HOSPITAL_COMMUNITY)
Admission: RE | Disposition: A | Discharge status: Home Health | Payer: Self-pay | Source: Ambulatory Visit | Attending: Orthopedic Surgery

## 2015-05-09 ENCOUNTER — Inpatient Hospital Stay (HOSPITAL_COMMUNITY): Payer: 59

## 2015-05-09 ENCOUNTER — Encounter (HOSPITAL_COMMUNITY): Payer: Self-pay | Admitting: Surgery

## 2015-05-09 DIAGNOSIS — T84060A Wear of articular bearing surface of internal prosthetic right hip joint, initial encounter: Secondary | ICD-10-CM | POA: Diagnosis not present

## 2015-05-09 DIAGNOSIS — D62 Acute posthemorrhagic anemia: Secondary | ICD-10-CM | POA: Diagnosis not present

## 2015-05-09 DIAGNOSIS — Z90710 Acquired absence of both cervix and uterus: Secondary | ICD-10-CM | POA: Diagnosis not present

## 2015-05-09 DIAGNOSIS — M24651 Ankylosis, right hip: Secondary | ICD-10-CM | POA: Diagnosis present

## 2015-05-09 DIAGNOSIS — Z96641 Presence of right artificial hip joint: Secondary | ICD-10-CM | POA: Diagnosis present

## 2015-05-09 DIAGNOSIS — M1611 Unilateral primary osteoarthritis, right hip: Secondary | ICD-10-CM | POA: Diagnosis present

## 2015-05-09 DIAGNOSIS — Z8542 Personal history of malignant neoplasm of other parts of uterus: Secondary | ICD-10-CM

## 2015-05-09 DIAGNOSIS — M659 Synovitis and tenosynovitis, unspecified: Secondary | ICD-10-CM | POA: Diagnosis present

## 2015-05-09 DIAGNOSIS — Z86010 Personal history of colonic polyps: Secondary | ICD-10-CM

## 2015-05-09 DIAGNOSIS — I10 Essential (primary) hypertension: Secondary | ICD-10-CM | POA: Diagnosis present

## 2015-05-09 DIAGNOSIS — M1990 Unspecified osteoarthritis, unspecified site: Secondary | ICD-10-CM | POA: Diagnosis not present

## 2015-05-09 DIAGNOSIS — T84090A Other mechanical complication of internal right hip prosthesis, initial encounter: Secondary | ICD-10-CM | POA: Diagnosis not present

## 2015-05-09 DIAGNOSIS — T8489XA Other specified complication of internal orthopedic prosthetic devices, implants and grafts, initial encounter: Principal | ICD-10-CM | POA: Diagnosis present

## 2015-05-09 DIAGNOSIS — Y793 Surgical instruments, materials and orthopedic devices (including sutures) associated with adverse incidents: Secondary | ICD-10-CM | POA: Diagnosis present

## 2015-05-09 DIAGNOSIS — M25551 Pain in right hip: Secondary | ICD-10-CM | POA: Diagnosis not present

## 2015-05-09 DIAGNOSIS — M4800 Spinal stenosis, site unspecified: Secondary | ICD-10-CM | POA: Diagnosis present

## 2015-05-09 DIAGNOSIS — T84050D Periprosthetic osteolysis of internal prosthetic right hip joint, subsequent encounter: Secondary | ICD-10-CM | POA: Diagnosis not present

## 2015-05-09 DIAGNOSIS — Z471 Aftercare following joint replacement surgery: Secondary | ICD-10-CM | POA: Diagnosis not present

## 2015-05-09 DIAGNOSIS — T849XXA Unspecified complication of internal orthopedic prosthetic device, implant and graft, initial encounter: Secondary | ICD-10-CM | POA: Diagnosis present

## 2015-05-09 DIAGNOSIS — T84090D Other mechanical complication of internal right hip prosthesis, subsequent encounter: Secondary | ICD-10-CM | POA: Diagnosis not present

## 2015-05-09 DIAGNOSIS — Z96649 Presence of unspecified artificial hip joint: Secondary | ICD-10-CM

## 2015-05-09 HISTORY — PX: TOTAL HIP REVISION: SHX763

## 2015-05-09 SURGERY — TOTAL HIP REVISION
Anesthesia: Spinal | Site: Hip | Laterality: Right

## 2015-05-09 MED ORDER — FLEET ENEMA 7-19 GM/118ML RE ENEM: 1.0000 | ENEMA | Freq: Once | RECTAL | Status: DC | PRN

## 2015-05-09 MED ORDER — MIDAZOLAM HCL 5 MG/5ML IJ SOLN
INTRAMUSCULAR | Status: DC | PRN
  Administered 2015-05-09 (×2): 1 mg via INTRAVENOUS

## 2015-05-09 MED ORDER — METHOCARBAMOL 500 MG PO TABS
500.0000 mg | ORAL_TABLET | Freq: Four times a day (QID) | ORAL | Status: DC | PRN
  Administered 2015-05-09 – 2015-05-11 (×5): 500 mg via ORAL
  Filled 2015-05-09 (×5): qty 1

## 2015-05-09 MED ORDER — ONDANSETRON HCL 4 MG/2ML IJ SOLN
INTRAMUSCULAR | Status: AC
  Filled 2015-05-09: qty 2

## 2015-05-09 MED ORDER — BUPIVACAINE-EPINEPHRINE (PF) 0.5% -1:200000 IJ SOLN
INTRAMUSCULAR | Status: AC
  Filled 2015-05-09: qty 30

## 2015-05-09 MED ORDER — OXYCODONE HCL 5 MG/5ML PO SOLN: 5.0000 mg | Freq: Once | ORAL | Status: AC | PRN

## 2015-05-09 MED ORDER — SODIUM CHLORIDE 0.9 % IV SOLN
2000.0000 mg | INTRAVENOUS | Status: DC | PRN
  Administered 2015-05-09: 2000 mg via TOPICAL

## 2015-05-09 MED ORDER — DOCUSATE SODIUM 100 MG PO CAPS
100.0000 mg | ORAL_CAPSULE | Freq: Two times a day (BID) | ORAL | Status: DC
  Administered 2015-05-09 – 2015-05-11 (×4): 100 mg via ORAL
  Filled 2015-05-09 (×5): qty 1

## 2015-05-09 MED ORDER — METHOCARBAMOL 500 MG PO TABS
ORAL_TABLET | ORAL | Status: AC
  Filled 2015-05-09: qty 1

## 2015-05-09 MED ORDER — ONDANSETRON HCL 4 MG/2ML IJ SOLN
INTRAMUSCULAR | Status: DC | PRN
  Administered 2015-05-09: 4 mg via INTRAVENOUS

## 2015-05-09 MED ORDER — OXYCODONE HCL 5 MG PO TABS
5.0000 mg | ORAL_TABLET | ORAL | Status: DC | PRN
  Administered 2015-05-09: 5 mg via ORAL
  Administered 2015-05-09: 10 mg via ORAL
  Administered 2015-05-10 (×2): 5 mg via ORAL
  Administered 2015-05-10 – 2015-05-11 (×2): 10 mg via ORAL
  Filled 2015-05-09: qty 2
  Filled 2015-05-09: qty 1
  Filled 2015-05-09 (×2): qty 2
  Filled 2015-05-09: qty 1
  Filled 2015-05-09: qty 2
  Filled 2015-05-09: qty 1

## 2015-05-09 MED ORDER — SODIUM CHLORIDE 0.9 % IR SOLN
Status: DC | PRN
  Administered 2015-05-09: 1000 mL

## 2015-05-09 MED ORDER — HYDROMORPHONE HCL 1 MG/ML IJ SOLN: 0.2500 mg | INTRAMUSCULAR | Status: DC | PRN

## 2015-05-09 MED ORDER — PROMETHAZINE HCL 25 MG/ML IJ SOLN: 6.2500 mg | INTRAMUSCULAR | Status: DC | PRN

## 2015-05-09 MED ORDER — ACETAMINOPHEN 325 MG PO TABS: 650.0000 mg | ORAL_TABLET | Freq: Four times a day (QID) | ORAL | Status: DC | PRN

## 2015-05-09 MED ORDER — METOCLOPRAMIDE HCL 5 MG PO TABS: 5.0000 mg | ORAL_TABLET | Freq: Three times a day (TID) | ORAL | Status: DC | PRN

## 2015-05-09 MED ORDER — BUPIVACAINE HCL (PF) 0.5 % IJ SOLN
INTRAMUSCULAR | Status: DC | PRN
  Administered 2015-05-09: 3 mL via INTRATHECAL

## 2015-05-09 MED ORDER — ONDANSETRON HCL 4 MG/2ML IJ SOLN
4.0000 mg | Freq: Four times a day (QID) | INTRAMUSCULAR | Status: DC | PRN
  Administered 2015-05-09: 4 mg via INTRAVENOUS
  Filled 2015-05-09: qty 2

## 2015-05-09 MED ORDER — ESTROGENS CONJUGATED 0.3 MG PO TABS: 0.3000 mg | ORAL_TABLET | Freq: Every day | ORAL | Status: DC

## 2015-05-09 MED ORDER — BUPIVACAINE-EPINEPHRINE 0.5% -1:200000 IJ SOLN
INTRAMUSCULAR | Status: DC | PRN
Start: 2015-05-09 — End: 2015-05-09
  Administered 2015-05-09: 10 mL

## 2015-05-09 MED ORDER — KCL IN DEXTROSE-NACL 20-5-0.45 MEQ/L-%-% IV SOLN
INTRAVENOUS | Status: DC
  Administered 2015-05-09 (×2): via INTRAVENOUS
  Filled 2015-05-09 (×2): qty 1000

## 2015-05-09 MED ORDER — BISACODYL 5 MG PO TBEC: 5.0000 mg | DELAYED_RELEASE_TABLET | Freq: Every day | ORAL | Status: DC | PRN

## 2015-05-09 MED ORDER — ASPIRIN EC 325 MG PO TBEC
325.0000 mg | DELAYED_RELEASE_TABLET | Freq: Every day | ORAL | Status: DC
  Administered 2015-05-10 – 2015-05-11 (×2): 325 mg via ORAL
  Filled 2015-05-09 (×2): qty 1

## 2015-05-09 MED ORDER — ASPIRIN EC 325 MG PO TBEC: 325.0000 mg | DELAYED_RELEASE_TABLET | Freq: Two times a day (BID) | ORAL | Status: DC

## 2015-05-09 MED ORDER — MENTHOL 3 MG MT LOZG: 1.0000 | LOZENGE | OROMUCOSAL | Status: DC | PRN

## 2015-05-09 MED ORDER — SODIUM CHLORIDE 0.9 % IV SOLN
2000.0000 mg | Freq: Once | INTRAVENOUS | Status: DC
  Administered 2015-05-09: 1000 mg via TOPICAL
  Filled 2015-05-09: qty 20

## 2015-05-09 MED ORDER — ACETAMINOPHEN 650 MG RE SUPP: 650.0000 mg | Freq: Four times a day (QID) | RECTAL | Status: DC | PRN

## 2015-05-09 MED ORDER — TIZANIDINE HCL 2 MG PO CAPS: 2.0000 mg | ORAL_CAPSULE | Freq: Three times a day (TID) | ORAL | Status: DC

## 2015-05-09 MED ORDER — ALUM & MAG HYDROXIDE-SIMETH 200-200-20 MG/5ML PO SUSP: 30.0000 mL | ORAL | Status: DC | PRN

## 2015-05-09 MED ORDER — HYDROCHLOROTHIAZIDE 25 MG PO TABS
25.0000 mg | ORAL_TABLET | Freq: Every day | ORAL | Status: DC
  Administered 2015-05-09 – 2015-05-11 (×3): 25 mg via ORAL
  Filled 2015-05-09 (×3): qty 1

## 2015-05-09 MED ORDER — METHOCARBAMOL 1000 MG/10ML IJ SOLN
500.0000 mg | Freq: Four times a day (QID) | INTRAMUSCULAR | Status: DC | PRN
Start: 2015-05-09 — End: 2015-05-11

## 2015-05-09 MED ORDER — FENTANYL CITRATE (PF) 250 MCG/5ML IJ SOLN
INTRAMUSCULAR | Status: AC
  Filled 2015-05-09: qty 5

## 2015-05-09 MED ORDER — CEFUROXIME SODIUM 1.5 G IJ SOLR
INTRAMUSCULAR | Status: AC
  Filled 2015-05-09: qty 1.5

## 2015-05-09 MED ORDER — SENNOSIDES-DOCUSATE SODIUM 8.6-50 MG PO TABS: 1.0000 | ORAL_TABLET | Freq: Every evening | ORAL | Status: DC | PRN

## 2015-05-09 MED ORDER — HYDROMORPHONE HCL 1 MG/ML IJ SOLN: 1.0000 mg | INTRAMUSCULAR | Status: DC | PRN

## 2015-05-09 MED ORDER — VENLAFAXINE HCL ER 75 MG PO CP24
75.0000 mg | ORAL_CAPSULE | Freq: Every day | ORAL | Status: DC
  Administered 2015-05-09 – 2015-05-10 (×2): 75 mg via ORAL
  Filled 2015-05-09 (×3): qty 1

## 2015-05-09 MED ORDER — TRANEXAMIC ACID 1000 MG/10ML IV SOLN
2000.0000 mg | INTRAVENOUS | Status: DC
  Filled 2015-05-09: qty 20

## 2015-05-09 MED ORDER — DEXAMETHASONE SODIUM PHOSPHATE 10 MG/ML IJ SOLN
10.0000 mg | Freq: Once | INTRAMUSCULAR | Status: AC
  Administered 2015-05-10: 10 mg via INTRAVENOUS
  Filled 2015-05-09: qty 1

## 2015-05-09 MED ORDER — PHENOL 1.4 % MT LIQD: 1.0000 | OROMUCOSAL | Status: DC | PRN

## 2015-05-09 MED ORDER — ALBUTEROL SULFATE (2.5 MG/3ML) 0.083% IN NEBU: 3.0000 mL | INHALATION_SOLUTION | Freq: Four times a day (QID) | RESPIRATORY_TRACT | Status: DC | PRN

## 2015-05-09 MED ORDER — LIDOCAINE HCL (CARDIAC) 20 MG/ML IV SOLN
INTRAVENOUS | Status: DC | PRN
  Administered 2015-05-09: 40 mg via INTRAVENOUS

## 2015-05-09 MED ORDER — PROPOFOL 500 MG/50ML IV EMUL
INTRAVENOUS | Status: DC | PRN
  Administered 2015-05-09: 35 ug/kg/min via INTRAVENOUS

## 2015-05-09 MED ORDER — PROPOFOL 10 MG/ML IV BOLUS
INTRAVENOUS | Status: DC | PRN
  Administered 2015-05-09 (×4): 20 mg via INTRAVENOUS

## 2015-05-09 MED ORDER — ATORVASTATIN CALCIUM 10 MG PO TABS
10.0000 mg | ORAL_TABLET | Freq: Every day | ORAL | Status: DC
  Administered 2015-05-09 – 2015-05-10 (×2): 10 mg via ORAL
  Filled 2015-05-09 (×2): qty 1

## 2015-05-09 MED ORDER — ONDANSETRON HCL 4 MG PO TABS: 4.0000 mg | ORAL_TABLET | Freq: Four times a day (QID) | ORAL | Status: DC | PRN

## 2015-05-09 MED ORDER — DIPHENHYDRAMINE HCL 12.5 MG/5ML PO ELIX: 12.5000 mg | ORAL_SOLUTION | ORAL | Status: DC | PRN

## 2015-05-09 MED ORDER — MIDAZOLAM HCL 2 MG/2ML IJ SOLN
INTRAMUSCULAR | Status: AC
  Filled 2015-05-09: qty 4

## 2015-05-09 MED ORDER — FENTANYL CITRATE (PF) 100 MCG/2ML IJ SOLN
INTRAMUSCULAR | Status: DC | PRN
  Administered 2015-05-09 (×5): 50 ug via INTRAVENOUS

## 2015-05-09 MED ORDER — METOCLOPRAMIDE HCL 5 MG/ML IJ SOLN: 5.0000 mg | Freq: Three times a day (TID) | INTRAMUSCULAR | Status: DC | PRN

## 2015-05-09 MED ORDER — TRANEXAMIC ACID 1000 MG/10ML IV SOLN
1000.0000 mg | INTRAVENOUS | Status: DC
  Filled 2015-05-09: qty 10

## 2015-05-09 MED ORDER — ESTROGENS CONJUGATED 0.3 MG PO TABS: 0.3000 mg | ORAL_TABLET | ORAL | Status: DC

## 2015-05-09 MED ORDER — OXYCODONE HCL 5 MG PO TABS
ORAL_TABLET | ORAL | Status: AC
  Filled 2015-05-09: qty 1

## 2015-05-09 MED ORDER — LIDOCAINE HCL (CARDIAC) 20 MG/ML IV SOLN
INTRAVENOUS | Status: AC
  Filled 2015-05-09: qty 5

## 2015-05-09 MED ORDER — OXYCODONE-ACETAMINOPHEN 5-325 MG PO TABS: 1.0000 | ORAL_TABLET | ORAL | Status: DC | PRN

## 2015-05-09 MED ORDER — OXYCODONE HCL 5 MG PO TABS
5.0000 mg | ORAL_TABLET | Freq: Once | ORAL | Status: AC | PRN
  Administered 2015-05-09: 5 mg via ORAL

## 2015-05-09 MED ORDER — PROPOFOL 10 MG/ML IV BOLUS
INTRAVENOUS | Status: AC
  Filled 2015-05-09: qty 20

## 2015-05-09 MED ORDER — LACTATED RINGERS IV SOLN
INTRAVENOUS | Status: DC
  Administered 2015-05-09 (×3): via INTRAVENOUS

## 2015-05-09 SURGICAL SUPPLY — 68 items
BLADE SAW SAG 73X25 THK (BLADE)
BLADE SAW SGTL 73X25 THK (BLADE) IMPLANT
BONE CANC CHIPS 40CC CAN1/2 (Bone Implant) ×2 IMPLANT
BRUSH FEMORAL CANAL (MISCELLANEOUS) IMPLANT
CHIPS CANC BONE 40CC CAN1/2 (Bone Implant) ×1 IMPLANT
COVER SURGICAL LIGHT HANDLE (MISCELLANEOUS) ×2 IMPLANT
DRAPE C-ARM 42X72 X-RAY (DRAPES) IMPLANT
DRAPE IMP U-DRAPE 54X76 (DRAPES) ×2 IMPLANT
DRAPE ORTHO SPLIT 77X108 STRL (DRAPES) ×1
DRAPE PROXIMA HALF (DRAPES) ×2 IMPLANT
DRAPE SURG ORHT 6 SPLT 77X108 (DRAPES) ×1 IMPLANT
DRAPE U-SHAPE 47X51 STRL (DRAPES) ×2 IMPLANT
DRILL BIT 7/64X5 (BIT) ×2 IMPLANT
DRSG AQUACEL AG ADV 3.5X10 (GAUZE/BANDAGES/DRESSINGS) ×2 IMPLANT
DRSG AQUACEL AG ADV 3.5X14 (GAUZE/BANDAGES/DRESSINGS) ×2 IMPLANT
DURAPREP 26ML APPLICATOR (WOUND CARE) ×2 IMPLANT
ELECT BLADE 4.0 EZ CLEAN MEGAD (MISCELLANEOUS)
ELECT BLADE 6.5 EXT (BLADE) IMPLANT
ELECT REM PT RETURN 9FT ADLT (ELECTROSURGICAL) ×2
ELECTRODE BLDE 4.0 EZ CLN MEGD (MISCELLANEOUS) IMPLANT
ELECTRODE REM PT RTRN 9FT ADLT (ELECTROSURGICAL) ×1 IMPLANT
EVACUATOR 1/8 PVC DRAIN (DRAIN) IMPLANT
FEMORAL HEAD C-TAPER 36 HIP (Hips) ×2 IMPLANT
GAUZE SPONGE 4X4 12PLY STRL (GAUZE/BANDAGES/DRESSINGS) ×2 IMPLANT
GAUZE XEROFORM 5X9 LF (GAUZE/BANDAGES/DRESSINGS) ×2 IMPLANT
GLOVE BIO SURGEON STRL SZ7.5 (GLOVE) ×2 IMPLANT
GLOVE BIO SURGEON STRL SZ8.5 (GLOVE) ×4 IMPLANT
GLOVE BIOGEL PI IND STRL 8 (GLOVE) ×2 IMPLANT
GLOVE BIOGEL PI IND STRL 9 (GLOVE) ×1 IMPLANT
GLOVE BIOGEL PI INDICATOR 8 (GLOVE) ×2
GLOVE BIOGEL PI INDICATOR 9 (GLOVE) ×1
GOWN STRL REUS W/ TWL LRG LVL3 (GOWN DISPOSABLE) ×1 IMPLANT
GOWN STRL REUS W/ TWL XL LVL3 (GOWN DISPOSABLE) ×2 IMPLANT
GOWN STRL REUS W/TWL LRG LVL3 (GOWN DISPOSABLE) ×1
GOWN STRL REUS W/TWL XL LVL3 (GOWN DISPOSABLE) ×2
HANDPIECE INTERPULSE COAX TIP (DISPOSABLE)
HEAD FEM C-TAPER 36 HIP (Hips) ×1 IMPLANT
HOOD PEEL AWAY FACE SHEILD DIS (HOOD) ×4 IMPLANT
INSERT POLY TRIDENT 36MM 10DEG (Orthopedic Implant) ×2 IMPLANT
KIT BASIN OR (CUSTOM PROCEDURE TRAY) ×2 IMPLANT
KIT ROOM TURNOVER OR (KITS) ×2 IMPLANT
MANIFOLD NEPTUNE II (INSTRUMENTS) ×2 IMPLANT
NEEDLE 22X1 1/2 (OR ONLY) (NEEDLE) ×2 IMPLANT
NS IRRIG 1000ML POUR BTL (IV SOLUTION) ×4 IMPLANT
PACK TOTAL JOINT (CUSTOM PROCEDURE TRAY) ×2 IMPLANT
PACK UNIVERSAL I (CUSTOM PROCEDURE TRAY) ×2 IMPLANT
PAD ARMBOARD 7.5X6 YLW CONV (MISCELLANEOUS) ×4 IMPLANT
PASSER SUT SWANSON 36MM LOOP (INSTRUMENTS) IMPLANT
PRESSURIZER FEMORAL UNIV (MISCELLANEOUS) IMPLANT
SET HNDPC FAN SPRY TIP SCT (DISPOSABLE) IMPLANT
SLEEVE SURGEON STRL (DRAPES) IMPLANT
SPONGE LAP 18X18 X RAY DECT (DISPOSABLE) IMPLANT
STAPLER VISISTAT 35W (STAPLE) ×2 IMPLANT
SUT ETHIBOND 2 V 37 (SUTURE) ×2 IMPLANT
SUT VIC AB 0 CTB1 27 (SUTURE) ×2 IMPLANT
SUT VIC AB 1 CTX 36 (SUTURE) ×1
SUT VIC AB 1 CTX36XBRD ANBCTR (SUTURE) ×1 IMPLANT
SUT VIC AB 2-0 CTB1 (SUTURE) ×2 IMPLANT
SUT VIC AB 3-0 CT1 27 (SUTURE) ×1
SUT VIC AB 3-0 CT1 TAPERPNT 27 (SUTURE) ×1 IMPLANT
SYR 20ML ECCENTRIC (SYRINGE) ×2 IMPLANT
SYR CONTROL 10ML LL (SYRINGE) ×2 IMPLANT
TOWEL OR 17X24 6PK STRL BLUE (TOWEL DISPOSABLE) ×2 IMPLANT
TOWEL OR 17X26 10 PK STRL BLUE (TOWEL DISPOSABLE) ×2 IMPLANT
TOWER CARTRIDGE SMART MIX (DISPOSABLE) IMPLANT
TRAY FOLEY CATH 14FR (SET/KITS/TRAYS/PACK) IMPLANT
TUBE ANAEROBIC SPECIMEN COL (MISCELLANEOUS) ×2 IMPLANT
WATER STERILE IRR 1000ML POUR (IV SOLUTION) ×6 IMPLANT

## 2015-05-09 NOTE — Discharge Instructions (Signed)

## 2015-05-09 NOTE — Op Note (Signed)
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  Post Op Dx: Same   Procedure: Revision right total up arthroplasty with removal of the worn polyethylene liner [employer] Trident, the shell was intact and well fixed, we packed 30 mL of bone graft through the central hole of the acetabular shell. New X3 liner to accept a 36 mm +0 ceramic head.  Surgeon: Kerin Salen, MD   Assistant: Kerry Hough. Barton Dubois (present throughout entire procedure and necessary for timely completion of the procedure)   Anesthesia: General   EBL: 300 mL Fluids: 1500 mL of crystalloid 1 g Trans Am a casted IV, 2 g topical.   Indications: S/p right Hip replacement on 2002 by Dr. Maia Breslow. Has had increasing right buttock pain, no thigh pain, no trochanteric pain. Serial CT scans over the last 2 years have shown a stable central acetabular cyst as well as assist in the greater trochanter. The central acetabular cyst is actually broken through the medial wall and one place and is 4 x 2 cm in size. The patient has been taking Fosamax in the hopes that would cut down on the inflammatory response to polyethylene, there is no evidence of trunnion wear. The patient desires elective revision of the acetabular component and possible revision of the femoral component to prevent further progression of the polyethylene reaction. Procedure: Patient identified by arm band and taken to operating room at [facility] appropriate, anesthetic monitors were attached and general endotracheal anesthesia induced with the patient in the supine position. Patient was placed in the left lateral decubitus position. The right lower extremity prepped and draped in usual sterile fashion from the ankle to the hemipelvis. Time out procedure performed and we began the operation by recreating the old total hip incision lateral to posterior lateral. The skin and  subcutaneous tissue were incised and dissected down to the iliotibial band. The iliotibial band was incised in line of the skin incision exposing intact pseudocapsule was peeled off the intertrochanteric crest and tagged with 2 #2 Ethibond sutures exposing the trunnion, femoral head, and posterior aspect of the acetabular component. Scar tissue was removed from around the rim of the acetabular component. The hip was then flexed and internally rotated dislocating the femoral head which was removed with a mallet and a metal cylinder. We remove scar tissue from around the stem and was found to be well placed and well fixed. The trunnion was then tucked superior and anterior to the acetabular shell and scar tissue is remote removed around the shell allowing [place] to remove the polyethylene liner the shell was also well fixed. We then probed the dome screw holes and the central occluder hole and found a large cyst behind the central occluder hole. We then remove fibrous material with a angled curette and suction tip. We irrigated with normal saline solution and packed the cyst through the occluder hole with 30 mL of crushed cancellus bone graft. Since her shell was stable we then impacted a new polyethylene liner index posterior superior to except a +0 36 mm ceramic head. The head was hammered onto the trunnion the hip reduced and excellent stability was noted with flexion of 90 internal rotation 75 in full extension external Rotation of 90 was also obtained. The wound was again irrigated out with pulse lavage. Pseudocapsule was repaired back to the intertrochanteric crest a drill holes with #2 Ethibond suture. We then  closed in layers with running 1-0 Vicryl suture in the IT band, running 0 and 2-0 Vicryl in the subcutaneous tissue and skin staples. A Aquacil dressing was applied.

## 2015-05-09 NOTE — Anesthesia Preprocedure Evaluation (Addendum)
Anesthesia Evaluation  Patient identified by MRN, date of birth, ID band Patient awake    Reviewed: Allergy & Precautions, H&P , NPO status , Patient's Chart, lab work & pertinent test results  History of Anesthesia Complications (+) PONV and history of anesthetic complications  Airway Mallampati: II  TM Distance: >3 FB Neck ROM: Full    Dental no notable dental hx.    Pulmonary neg pulmonary ROS,    Pulmonary exam normal breath sounds clear to auscultation       Cardiovascular hypertension, Normal cardiovascular exam Rhythm:Regular Rate:Normal     Neuro/Psych negative neurological ROS  negative psych ROS   GI/Hepatic negative GI ROS, Neg liver ROS,   Endo/Other  negative endocrine ROS  Renal/GU negative Renal ROS  negative genitourinary   Musculoskeletal negative musculoskeletal ROS (+)   Abdominal   Peds negative pediatric ROS (+)  Hematology negative hematology ROS (+)   Anesthesia Other Findings   Reproductive/Obstetrics negative OB ROS                            Anesthesia Physical  Anesthesia Plan  ASA: II  Anesthesia Plan: Spinal   Post-op Pain Management:    Induction: Intravenous  Airway Management Planned: Simple Face Mask  Additional Equipment:   Intra-op Plan:   Post-operative Plan: Extubation in OR  Informed Consent: I have reviewed the patients History and Physical, chart, labs and discussed the procedure including the risks, benefits and alternatives for the proposed anesthesia with the patient or authorized representative who has indicated his/her understanding and acceptance.   Dental advisory given  Plan Discussed with: CRNA, Surgeon and Anesthesiologist  Anesthesia Plan Comments: (Patient desires spinal)       Anesthesia Quick Evaluation

## 2015-05-09 NOTE — Anesthesia Postprocedure Evaluation (Signed)
  Anesthesia Post-op Note  Patient: Christina Griffith  Procedure(s) Performed: Procedure(s) (LRB): TOTAL HIP REVISION (Right)  Patient Location: PACU  Anesthesia Type: Spinal  Level of Consciousness: awake and alert   Airway and Oxygen Therapy: Patient Spontanous Breathing  Post-op Pain: mild  Post-op Assessment: Post-op Vital signs reviewed, Patient's Cardiovascular Status Stable, Respiratory Function Stable, Patent Airway and No signs of Nausea or vomiting  Last Vitals:  Filed Vitals:   05/09/15 1400  BP: 145/82  Pulse: 75  Temp:   Resp: 16    Post-op Vital Signs: stable   Complications: No apparent anesthesia complications

## 2015-05-09 NOTE — Plan of Care (Signed)
Problem: Consults Goal: Diagnosis- Total Joint Replacement Primary Total Hip Right     

## 2015-05-09 NOTE — Progress Notes (Signed)
Utilization review completed.  

## 2015-05-09 NOTE — Transfer of Care (Signed)
Immediate Anesthesia Transfer of Care Note  Patient: Christina Griffith  Procedure(s) Performed: Procedure(s): TOTAL HIP REVISION (Right)  Patient Location: PACU  Anesthesia Type:Spinal  Level of Consciousness: awake, alert , oriented and sedated  Airway & Oxygen Therapy: Patient Spontanous Breathing and Patient connected to nasal cannula oxygen  Post-op Assessment: Report given to RN, Post -op Vital signs reviewed and stable and Patient moving all extremities  Post vital signs: Reviewed and stable  Last Vitals:  Filed Vitals:   05/09/15 0818  BP: 159/82  Pulse: 93  Temp: 36.8 C  Resp: 20    Complications: No apparent anesthesia complications

## 2015-05-09 NOTE — Interval H&P Note (Signed)
History and Physical Interval Note:  05/09/2015 9:26 AM  [name]  has presented today for surgery, with the diagnosis of REVISION RIGHT TOTAL HIP  The various methods of treatment have been discussed with the patient and family. After consideration of risks, benefits and other options for treatment, the patient has consented to  Procedure(s): TOTAL HIP REVISION (Right) as a surgical intervention .  The patient's history has been reviewed, patient examined, no change in status, stable for surgery.  I have reviewed the patient's chart and labs.  Questions were answered to the patient's satisfaction.     Kerin Salen

## 2015-05-09 NOTE — Evaluation (Signed)
Physical Therapy Evaluation Patient Details Name: SUHAYLA CHISOM MRN: [id number] DOB: 1951/01/11 Today's Date: 05/09/2015   History of Present Illness  Patient is a 64 y/o female s/p right total hip revision. PMH includes HTN, endometrial ca, SUI, depression.  Clinical Impression  Patient presents with pain and post surgical deficits RLE s/p R total hip revision. Mobility limited due to feelings of dizziness and light headedness resulting in need to return to supine. Pt able to recall posterior hip precautions independently from prior surgeries. Instructed pt in exercises. Pt reports she will have support around the clock from family/friends at discharge. Will follow acutely to maximize independence and mobility prior to return home.    Follow Up Recommendations Home health PT;Supervision/Assistance - 24 hour    Equipment Recommendations  None recommended by PT    Recommendations for Other Services OT consult     Precautions / Restrictions Precautions Precautions: Posterior Hip Precaution Booklet Issued: No Precaution Comments: Pt able to independently state 3/3 hip precautions. Restrictions Weight Bearing Restrictions: Yes RLE Weight Bearing: Weight bearing as tolerated      Mobility  Bed Mobility Overal bed mobility: Needs Assistance Bed Mobility: Supine to Sit;Sit to Supine     Supine to sit: Min guard;HOB elevated Sit to supine: Min assist   General bed mobility comments: Min guard to get to EOB. Min A to bring RLE into bed. Use of rails. + dizziness/lightheadedness upon sitting EOB.  Transfers Overall transfer level: Needs assistance Equipment used: Rolling walker (2 wheeled) Transfers: Sit to/from Stand Sit to Stand: Min assist         General transfer comment: Min A to boost from EOB with cues for hand placement. + dizziness.  Ambulation/Gait                Stairs            Wheelchair Mobility    Modified Rankin (Stroke Patients  Only)       Balance Overall balance assessment: Needs assistance Sitting-balance support: Feet supported;No upper extremity supported Sitting balance-Leahy Scale: Good     Standing balance support: During functional activity Standing balance-Leahy Scale: Poor Standing balance comment: Relient on RW for support.                             Pertinent Vitals/Pain Pain Assessment: Faces Faces Pain Scale: Hurts even more Pain Location: right hip Pain Descriptors / Indicators: Sore;Aching Pain Intervention(s): Monitored during session;Repositioned;Ice applied    Home Living Family/patient expects to be discharged to:: Private residence Living Arrangements: Alone Available Help at Discharge: Friend(s);Available PRN/intermittently;Family (Friends will be rotating around clock to provide support at home.) Type of Home: House Home Access: Stairs to enter Entrance Stairs-Rails: None Entrance Stairs-Number of Steps: 2 or 4 Home Layout: Able to live on main level with bedroom/bathroom;Two level Home Equipment: Walker - standard;Bedside commode;Shower seat      Prior Function Level of Independence: Independent         Comments: Works as Lexicographer.     Hand Dominance        Extremity/Trunk Assessment   Upper Extremity Assessment: Defer to OT evaluation           Lower Extremity Assessment: RLE deficits/detail RLE Deficits / Details: Able to perform LAQ. Limited AROM/strength secondary to pain.       Communication   Communication: No difficulties  Cognition Arousal/Alertness: Awake/alert Behavior During Therapy: WFL for tasks assessed/performed  Overall Cognitive Status: Within Functional Limits for tasks assessed                      General Comments General comments (skin integrity, edema, etc.): Friend present in room during session.    Exercises Total Joint Exercises Ankle Circles/Pumps: Both;10 reps;Supine Quad Sets: Both;10  reps;Supine Gluteal Sets: Both;10 reps;Supine      Assessment/Plan    PT Assessment Patient needs continued PT services  PT Diagnosis Difficulty walking;Acute pain   PT Problem List Decreased strength;Decreased activity tolerance;Decreased balance;Decreased mobility;Pain  PT Treatment Interventions Balance training;Gait training;Stair training;Functional mobility training;Therapeutic activities;Therapeutic exercise;Patient/family education   PT Goals (Current goals can be found in the Care Plan section) Acute Rehab PT Goals Patient Stated Goal: to return to independence PT Goal Formulation: With patient Time For Goal Achievement: 05/23/15 Potential to Achieve Goals: Good    Frequency 7X/week   Barriers to discharge        Co-evaluation               End of Session Equipment Utilized During Treatment: Gait belt Activity Tolerance: Patient tolerated treatment well;Treatment limited secondary to medical complications (Comment) (feeling lightheaded like she is "going to pass out.") Patient left: in bed;with call bell/phone within reach;with family/visitor present;with SCD's reapplied Nurse Communication: Mobility status         Time: 2878-6767 PT Time Calculation (min) (ACUTE ONLY): 21 min   Charges:   PT Evaluation $Initial PT Evaluation Tier I: 1 Procedure     PT G Codes:        Tyianna Menefee A Dakarai Mcglocklin 05/09/2015, 4:54 PM  Wray Kearns, [place], DPT [phone]

## 2015-05-09 NOTE — Anesthesia Procedure Notes (Signed)
Spinal Patient location during procedure: OR Staffing Anesthesiologist: JUDD, BENJAMIN Performed by: anesthesiologist  Preanesthetic Checklist Completed: patient identified, site marked, surgical consent, pre-op evaluation, timeout performed, IV checked, risks and benefits discussed and monitors and equipment checked Spinal Block Patient position: sitting Prep: DuraPrep Patient monitoring: heart rate, continuous pulse ox and blood pressure Location: L3-4 Injection technique: single-shot Needle Needle type: Spinocan  Needle gauge: 27 G Needle length: 9 cm Additional Notes Expiration date of kit checked and confirmed. Patient tolerated procedure well, without complications.     

## 2015-05-10 ENCOUNTER — Encounter (HOSPITAL_COMMUNITY): Payer: Self-pay | Admitting: Orthopedic Surgery

## 2015-05-10 LAB — CBC
HCT: 35.3 % — ABNORMAL LOW (ref 36.0–46.0)
Hemoglobin: 11.5 g/dL — ABNORMAL LOW (ref 12.0–15.0)
MCH: 30.8 pg (ref 26.0–34.0)
MCHC: 32.6 g/dL (ref 30.0–36.0)
MCV: 94.6 fL (ref 78.0–100.0)
Platelets: 186 10*3/uL (ref 150–400)
RBC: 3.73 MIL/uL — ABNORMAL LOW (ref 3.87–5.11)
RDW: 12.9 % (ref 11.5–15.5)
WBC: 6.6 10*3/uL (ref 4.0–10.5)

## 2015-05-10 MED ORDER — PROPOFOL 10 MG/ML IV BOLUS
INTRAVENOUS | Status: AC
  Filled 2015-05-10: qty 20

## 2015-05-10 MED ORDER — FENTANYL CITRATE (PF) 250 MCG/5ML IJ SOLN
INTRAMUSCULAR | Status: AC
  Filled 2015-05-10: qty 5

## 2015-05-10 MED ORDER — MIDAZOLAM HCL 2 MG/2ML IJ SOLN
INTRAMUSCULAR | Status: AC
  Filled 2015-05-10: qty 4

## 2015-05-10 NOTE — Progress Notes (Signed)
Patient ID: Christina Griffith, female   DOB: August 31, 1950, 64 y.o.   MRN: [id number] PATIENT ID: Christina Griffith  MRN: [id number]  DOB/AGE:  1951-03-15 / 64 y.o.  1 Day Post-Op Procedure(s) (LRB): TOTAL HIP REVISION (Right)    PROGRESS NOTE Subjective: Patient is alert, oriented, no Nausea, no Vomiting, yes passing gas, no Bowel Movement. Taking PO well. Denies SOB, Chest or Calf Pain. Using Incentive Spirometer, PAS in place. Ambulate in room Patient reports pain as 3 on 0-10 scale  .    Objective: Vital signs in last 24 hours: Filed Vitals:   05/09/15 1547 05/09/15 2016 05/10/15 0051 05/10/15 0644  BP: 132/81 123/65 111/60 113/60  Pulse: 84 85 84 88  Temp: 97.5 F (36.4 C) 97.3 F (36.3 C) 98.4 F (36.9 C) 98.2 F (36.8 C)  TempSrc:  Oral Oral Oral  Resp: 16 17 17 17   Height:      Weight:      SpO2: 100% 100% 96% 97%      Intake/Output from previous day: I/O last 3 completed shifts: In: 1000 [I.V.:1000] Out: 600 [Urine:300; Blood:300]   Intake/Output this shift:     LABORATORY DATA:  Recent Labs  05/10/15 0533  WBC 6.6  HGB 11.5*  HCT 35.3*  PLT 186    Examination: Neurologically intact ABD soft Neurovascular intact Sensation intact distally Intact pulses distally Dorsiflexion/Plantar flexion intact Incision: scant drainage No cellulitis present Compartment soft} XR AP&Lat of hip shows well placed\fixed THA  Assessment:   1 Day Post-Op Procedure(s) (LRB): TOTAL HIP REVISION (Right) ADDITIONAL DIAGNOSIS:  Expected Acute Blood Loss Anemia, Hypertension, spinal stenosis  Plan: PT/OT WBAT, THA  posterior precautions  DVT Prophylaxis: SCDx72 hrs, ASA 325 mg BID x 2 weeks  DISCHARGE PLAN: Home, when passes PT  DISCHARGE NEEDS: HHPT, Walker and 3-in-1 comode seat

## 2015-05-10 NOTE — Evaluation (Signed)
Occupational Therapy Evaluation Patient Details Name: Christina Griffith MRN: [id number] DOB: 10/16/1950 Today's Date: 05/10/2015    History of Present Illness Patient is a 64 y/o female s/p right total hip revision. PMH includes HTN, endometrial ca, SUI, depression.   Clinical Impression   Pt reports she was independent with ADLs PTA. Currently pt is supervision overall for ADLs with use of AE for LB. Educated pt on compensatory strategies for LB ADLs, safety with RW, use of AE, adhering to precautions during functional activities, and walk in shower transfer. Pt return demonstrated walk in shower transfer, safety with RW and adhering to precautions. Pt verbalized understanding of all other education. Pt reports that she has no further questions for OT and feels comfortable with self care upon return home. Pt plan to d/c home with 24/7 supervision provided by friends. Pt is ready to d/c home from an OT standpoint. At this time, d/c pt from OT services. Thank you for this referral.     Follow Up Recommendations  No OT follow up;Supervision/Assistance - 24 hour    Equipment Recommendations  None recommended by OT    Recommendations for Other Services       Precautions / Restrictions Precautions Precautions: Posterior Hip;Fall Precaution Comments: Pt able to independently state 3/3 hip precautions. Restrictions Weight Bearing Restrictions: Yes RLE Weight Bearing: Weight bearing as tolerated      Mobility Bed Mobility Overal bed mobility: Modified Independent Bed Mobility: Supine to Sit;Sit to Supine     Supine to sit: Modified independent (Device/Increase time)     General bed mobility comments: HOB flat and bed rail down to simulate home environment.  Pt assists lifting Rt LE using her Rt UE to slide to EOB and back into bed.  Pt adhering to precautions w/o any need for cues or physical assist.  Transfers Overall transfer level: Needs assistance Equipment used: Rolling  walker (2 wheeled) Transfers: Sit to/from Stand Sit to Stand: From elevated surface;Supervision         General transfer comment: Supervision for safety. Bed elevated to simulate home environment. Pt adhering to hip precautions    Balance Overall balance assessment: Needs assistance Sitting-balance support: No upper extremity supported Sitting balance-Leahy Scale: Good     Standing balance support: Bilateral upper extremity supported Standing balance-Leahy Scale: Fair                              ADL Overall ADL's : Needs assistance/impaired Eating/Feeding: Set up;Sitting   Grooming: Supervision/safety;Standing   Upper Body Bathing: Supervision/ safety;Sitting   Lower Body Bathing: Supervison/ safety;Sit to/from stand;Adhering to hip precautions   Upper Body Dressing : Supervision/safety;Sitting   Lower Body Dressing: Supervision/safety;Sit to/from stand;Adhering to hip precautions   Toilet Transfer: Supervision/safety;Ambulation;BSC;RW (BSC over toilet)   Toileting- Clothing Manipulation and Hygiene: Supervision/safety;Sit to/from stand   Tub/ Shower Transfer: Supervision/safety;Adhering to hip precautions;Ambulation;Shower seat;Rolling walker   Functional mobility during ADLs: Supervision/safety;Rolling walker General ADL Comments: Friend present for OT eval. Educated pt on compensatory strategies for LB ADLs, use of AE, adhering to precautions during functional activities, safety with RW, walk in shower transfer. Pt able to demonstrate walk in shower transfer and safety with RW. Pt reports that she has found strategies that work for LB dressing from previous hip replacements and feels comfortable being able to complete ADLs at home.      Vision     Perception     Praxis  Pertinent Vitals/Pain Pain Assessment: Faces Pain Score: 6  Faces Pain Scale: Hurts little more Pain Location: R hip Pain Descriptors / Indicators: Tightness;Sore Pain  Intervention(s): Limited activity within patient's tolerance;Monitored during session     Hand Dominance     Extremity/Trunk Assessment Upper Extremity Assessment Upper Extremity Assessment: Overall WFL for tasks assessed   Lower Extremity Assessment Lower Extremity Assessment: Defer to PT evaluation   Cervical / Trunk Assessment Cervical / Trunk Assessment: Normal   Communication Communication Communication: No difficulties   Cognition Arousal/Alertness: Awake/alert Behavior During Therapy: WFL for tasks assessed/performed Overall Cognitive Status: Within Functional Limits for tasks assessed                     General Comments       Exercises Exercises: Total Joint     Shoulder Instructions      Home Living Family/patient expects to be discharged to:: Private residence Living Arrangements: Alone Available Help at Discharge: Friend(s);Family;Available 24 hours/day (Friends will be rotating around the clock to provide S ) Type of Home: House Home Access: Stairs to enter [employer] of Steps: 2 or 4 Entrance Stairs-Rails: None Home Layout: Able to live on main level with bedroom/bathroom;Two level     Bathroom Shower/Tub: Walk-in shower;Door   [employer] Toilet: Standard Bathroom Accessibility: Yes How Accessible: Accessible via walker Home Equipment: Walker - standard;Bedside commode;Shower seat Management consultant)          Prior Functioning/Environment Level of Independence: Independent        Comments: Works as Lexicographer.    OT Diagnosis: Generalized weakness;Acute pain   OT Problem List:     OT Treatment/Interventions:      OT Goals(Current goals can be found in the care plan section) Acute Rehab OT Goals Patient Stated Goal: to return to independence  OT Frequency:     Barriers to D/C:            Co-evaluation              End of Session Equipment Utilized During Treatment: Gait belt;Rolling walker  Activity Tolerance:  Patient tolerated treatment well Patient left: in bed;with call bell/phone within reach;with family/visitor present   Time: 4193-7902 OT Time Calculation (min): 16 min Charges:  OT General Charges $OT Visit: 1 Procedure OT Evaluation $Initial OT Evaluation Tier I: 1 Procedure G-Codes:     Binnie Kand M.S., OTR/L Pager: [phone]  05/10/2015, 2:39 PM

## 2015-05-10 NOTE — Progress Notes (Signed)
Physical Therapy Treatment Patient Details Name: Christina Griffith MRN: [id number] DOB: 1951-07-13 Today's Date: 05/10/2015    History of Present Illness Patient is a 64 y/o female s/p right total hip revision. PMH includes HTN, endometrial ca, SUI, depression.    PT Comments    Christina Griffith continues to progress well w/ therapy and ambulated 500 ft in hallway w/ supervision.  She is anticipating d/c tomorrow.    Follow Up Recommendations  Home health PT;Supervision/Assistance - 24 hour     Equipment Recommendations  Rolling walker with 5" wheels    Recommendations for Other Services OT consult     Precautions / Restrictions Precautions Precautions: Posterior Hip;Fall Precaution Comments: Pt able to independently state 3/3 hip precautions. Restrictions Weight Bearing Restrictions: Yes RLE Weight Bearing: Weight bearing as tolerated    Mobility  Bed Mobility Overal bed mobility: Modified Independent Bed Mobility: Supine to Sit;Sit to Supine     Supine to sit: Modified independent (Device/Increase time)     General bed mobility comments: HOB flat and bed rail down to simulate home environment.  Pt assists lifting Rt LE using her Rt UE to slide to EOB and back into bed.  Pt adhering to precautions w/o any need for cues or physical assist.  Transfers Overall transfer level: Needs assistance Equipment used: Rolling walker (2 wheeled) Transfers: Sit to/from Stand Sit to Stand: From elevated surface;Supervision         General transfer comment: Supervision for safety.  Pt w/ safe technique.  Bed elevated to allow adherence to hip precautions.  Ambulation/Gait Ambulation/Gait assistance: Supervision Ambulation Distance (Feet): 500 Feet Assistive device: Rolling walker (2 wheeled) Gait Pattern/deviations: Step-through pattern;Antalgic;Decreased weight shift to right   Gait velocity interpretation: Below normal speed for age/gender General Gait Details: Cues again for  activation of glutes during stance phase, pt reports and demonstrates improved stability.    Stairs Stairs: Yes Stairs assistance: Min assist Stair Management: No rails;Backwards;Step to pattern;With walker Number of Stairs: 2 General stair comments: Cues for technique.  Performed once w/ therapist and once w/ friend who will be assisting at home.  Min assist provided to stabilize RW.  Wheelchair Mobility    Modified Rankin (Stroke Patients Only)       Balance Overall balance assessment: Needs assistance Sitting-balance support: No upper extremity supported;Feet supported Sitting balance-Leahy Scale: Good     Standing balance support: Bilateral upper extremity supported;During functional activity Standing balance-Leahy Scale: Fair                      Cognition Arousal/Alertness: Awake/alert Behavior During Therapy: WFL for tasks assessed/performed Overall Cognitive Status: Within Functional Limits for tasks assessed                      Exercises Total Joint Exercises Ankle Circles/Pumps: AROM;Both;15 reps;Seated Hip ABduction/ADduction: AROM;Right;10 reps;Standing Long Arc Quad: AROM;Both;10 reps;Seated Knee Flexion: AROM;Right;10 reps;Standing Marching in Standing: AROM;Right;10 reps;Standing;Other (comment) (limited to 45 deg hip flexion) Standing Hip Extension: AROM;Right;10 reps;Standing    General Comments General comments (skin integrity, edema, etc.): Discussed safe car transfer technique w/ pt who verbalized understanding.      Pertinent Vitals/Pain Pain Assessment: 0-10 Pain Score: 6  Pain Location: Rt hip Pain Descriptors / Indicators: Tightness;Sore Pain Intervention(s): Limited activity within patient's tolerance;Monitored during session;Repositioned    Home Living                      Prior  Function            PT Goals (current goals can now be found in the care plan section) Acute Rehab PT Goals Patient Stated Goal:  to return to independence PT Goal Formulation: With patient Time For Goal Achievement: 05/23/15 Potential to Achieve Goals: Good Progress towards PT goals: Progressing toward goals    Frequency  7X/week    PT Plan Current plan remains appropriate    Co-evaluation             End of Session Equipment Utilized During Treatment: Gait belt Activity Tolerance: Patient tolerated treatment well Patient left: with call bell/phone within reach;with family/visitor present;in bed     Time: 3612-2449 PT Time Calculation (min) (ACUTE ONLY): 27 min  Charges:  $Gait Training: 8-22 mins $Therapeutic Exercise: 8-22 mins                    G Codes:      Joslyn Hy PT, DPT [phone] Pager: [phone] 05/10/2015, 2:01 PM

## 2015-05-10 NOTE — Care Management Note (Addendum)
Case Management Note  Patient Details  Name: ANBER MCKIVER MRN: [id number] Date of Birth: 1951/07/30  Subjective/Objective:             S/p right total hip arthroplasty       Action/Plan: Set up with [facility] for HHPT by MD office. Spoke with patient, no change in discharge plan. Patient stated that she has a walker, BSC, and shower bench at home, no equipment needs identified. Patient stated that she will have assistance after discharge.   Expected Discharge Date:                  Expected Discharge Plan:  [place]  In-House Referral:  NA  Discharge planning Services  CM Consult  Post Acute Care Choice:  Home Health Choice offered to:  Patient  DME Arranged:    DME Agency:     HH Arranged:  PT [place]:  [place]  Status of Service:  In process, will continue to follow  Medicare Important Message Given:    Date Medicare IM Given:    Medicare IM give by:    Date Additional Medicare IM Given:    Additional Medicare Important Message give by:     If discussed at [place] of Stay Meetings, dates discussed:    Additional Comments: Patient requested rolling walker, decided her present walker was not in good condition. Contacted [facility] with [facility] and requested rolling walker be delivered to patient's room.     Nila Nephew, RN 05/10/2015, 10:28 AM

## 2015-05-10 NOTE — Progress Notes (Signed)
Physical Therapy Treatment Patient Details Name: Christina Griffith MRN: [id number] DOB: 11-15-1950 Today's Date: 05/10/2015    History of Present Illness Patient is a 64 y/o female s/p right total hip revision. PMH includes HTN, endometrial ca, SUI, depression.    PT Comments    Christina Griffith is progressing well w/ thearpy and demonstrated ability to ambulate 300 ft w/ supervision and ascend/descend 4 steps w/ min assist this session.  She is anticipating d/c tomorrow as she will not have assist available today if she were to d/c home.   Follow Up Recommendations  Home health PT;Supervision/Assistance - 24 hour     Equipment Recommendations  None recommended by PT    Recommendations for Other Services OT consult     Precautions / Restrictions Precautions Precautions: Posterior Hip;Fall Precaution Comments: Pt able to independently state 3/3 hip precautions. Restrictions Weight Bearing Restrictions: Yes RLE Weight Bearing: Weight bearing as tolerated    Mobility  Bed Mobility Overal bed mobility: Modified Independent Bed Mobility: Supine to Sit     Supine to sit: Modified independent (Device/Increase time)     General bed mobility comments: HOB flat and bed rail down to simulate home environment.  Pt assists lifting Rt LE using her Rt UE to slide to EOB.  Pt adhering to precautions w/o any need for cues or physical assist.  Transfers Overall transfer level: Needs assistance Equipment used: Rolling walker (2 wheeled) Transfers: Sit to/from Stand Sit to Stand: Min guard;From elevated surface         General transfer comment: Min guard for safety.  Pt w/ safe technique.  Bed elevated to allow adherence to hip precautions.  Ambulation/Gait Ambulation/Gait assistance: Supervision Ambulation Distance (Feet): 300 Feet Assistive device: Rolling walker (2 wheeled) Gait Pattern/deviations: Step-through pattern;Antalgic;Decreased weight shift to right   Gait velocity  interpretation: Below normal speed for age/gender General Gait Details: Cues to activate Lt and Rt glute during stance phase, as pt demonstrates Rt trendelenburg before correcting.  Pt w/ excellent posture and min WB through RW.   Stairs Stairs: Yes Stairs assistance: Min assist Stair Management: No rails;Backwards;Step to pattern;With walker Number of Stairs: 2 General stair comments: Cues for technique.  Performed once w/ therapist and once w/ friend who will be assisting at home.  Min assist provided to stabilize RW.  Wheelchair Mobility    Modified Rankin (Stroke Patients Only)       Balance Overall balance assessment: Needs assistance Sitting-balance support: No upper extremity supported;Feet supported Sitting balance-Leahy Scale: Good     Standing balance support: Bilateral upper extremity supported;During functional activity Standing balance-Leahy Scale: Fair                      Cognition Arousal/Alertness: Awake/alert Behavior During Therapy: WFL for tasks assessed/performed Overall Cognitive Status: Within Functional Limits for tasks assessed                      Exercises Total Joint Exercises Ankle Circles/Pumps: AROM;Both;15 reps;Seated Long Arc Quad: AROM;Both;10 reps;Seated Standing Hip Extension: AROM;Right;10 reps;Standing    General Comments General comments (skin integrity, edema, etc.): Friend present throughout PT session.      Pertinent Vitals/Pain Pain Assessment: 0-10 Pain Score: 6  (4/10 w/ ambulation) Pain Location: R hip Pain Descriptors / Indicators: Tightness;Sore Pain Intervention(s): Limited activity within patient's tolerance;Monitored during session;Repositioned    Home Living  Prior Function            PT Goals (current goals can now be found in the care plan section) Acute Rehab PT Goals Patient Stated Goal: to return to independence PT Goal Formulation: With patient Time For  Goal Achievement: 05/23/15 Potential to Achieve Goals: Good Progress towards PT goals: Progressing toward goals    Frequency  7X/week    PT Plan Current plan remains appropriate    Co-evaluation             End of Session Equipment Utilized During Treatment: Gait belt Activity Tolerance: Patient tolerated treatment well Patient left: in chair;with call bell/phone within reach;with family/visitor present     Time: 6789-3810 PT Time Calculation (min) (ACUTE ONLY): 31 min  Charges:  $Gait Training: 23-37 mins                    G Codes:      Christina Griffith PT, [place] [phone] Pager: [phone] 05/10/2015, 11:38 AM

## 2015-05-11 ENCOUNTER — Encounter (HOSPITAL_COMMUNITY): Payer: Self-pay | Admitting: Orthopedic Surgery

## 2015-05-11 LAB — CBC
HCT: 34.1 % — ABNORMAL LOW (ref 36.0–46.0)
Hemoglobin: 11.2 g/dL — ABNORMAL LOW (ref 12.0–15.0)
MCH: 31.1 pg (ref 26.0–34.0)
MCHC: 32.8 g/dL (ref 30.0–36.0)
MCV: 94.7 fL (ref 78.0–100.0)
Platelets: 182 10*3/uL (ref 150–400)
RBC: 3.6 MIL/uL — ABNORMAL LOW (ref 3.87–5.11)
RDW: 12.9 % (ref 11.5–15.5)
WBC: 8.5 10*3/uL (ref 4.0–10.5)

## 2015-05-11 NOTE — Progress Notes (Signed)
PATIENT ID: Christina Griffith  MRN: [id number]  DOB/AGE:  12-02-1950 / 64 y.o.  2 Days Post-Op Procedure(s) (LRB): TOTAL HIP REVISION (Right)    PROGRESS NOTE Subjective: Patient is alert, oriented, no Nausea, no Vomiting, yes passing gas, no Bowel Movement. Taking PO well with pt up and eating in chair. Denies SOB, Chest or Calf Pain. Using Incentive Spirometer, PAS in place. Ambulate WBAT with Pt walking 500 ft with therapy. Patient reports pain as mild  .    Objective: Vital signs in last 24 hours: Filed Vitals:   05/10/15 0644 05/10/15 1500 05/10/15 2050 05/11/15 0552  BP: 113/60 119/70 107/52 139/72  Pulse: 88 89 86 86  Temp: 98.2 F (36.8 C) 98.6 F (37 C) 98.4 F (36.9 C) 98.2 F (36.8 C)  TempSrc: Oral  Oral Oral  Resp: 17 16 17 20   Height:      Weight:      SpO2: 97%  100% 100%      Intake/Output from previous day: I/O last 3 completed shifts: In: 840 [P.O.:840] Out: -    Intake/Output this shift:     LABORATORY DATA:  Recent Labs  05/10/15 0533 05/11/15 0450  WBC 6.6 8.5  HGB 11.5* 11.2*  HCT 35.3* 34.1*  PLT 186 182    Examination: Neurologically intact Neurovascular intact Sensation intact distally Intact pulses distally Dorsiflexion/Plantar flexion intact Incision: scant drainage No cellulitis present Compartment soft} XR AP&Lat of hip shows well placed\fixed THA  Assessment:   2 Days Post-Op Procedure(s) (LRB): TOTAL HIP REVISION (Right) ADDITIONAL DIAGNOSIS:  Expected Acute Blood Loss Anemia, Hypertension  Plan: PT/OT WBAT, THA  posterior precautions  DVT Prophylaxis: SCDx72 hrs, ASA 325 mg BID x 2 weeks  DISCHARGE PLAN: Home, today  DISCHARGE NEEDS: HHPT, Walker and 3-in-1 comode seat

## 2015-05-11 NOTE — Progress Notes (Signed)
Physical Therapy Treatment Patient Details Name: Christina Griffith MRN: [id number] DOB: 05-09-1951 Today's Date: 05/11/2015    History of Present Illness Patient is a 64 y/o female s/p right total hip revision. PMH includes HTN, endometrial ca, SUI, depression.    PT Comments    Patient progressing well towards PT goals. Ambulating and performing transfers Mod I. Performed stair training with min A to stabilize RW. Adhering to hip precautions during mobility. Pt safe to discharge from a mobility standpoint as pt will have support at home. Will follow acutely if still in hospital tomorrow.    Follow Up Recommendations  Home health PT;Supervision - Intermittent     Equipment Recommendations  Rolling walker with 5" wheels    Recommendations for Other Services       Precautions / Restrictions Precautions Precautions: Posterior Hip;Fall Precaution Booklet Issued: No Precaution Comments: Pt able to independently state 3/3 hip precautions. Restrictions Weight Bearing Restrictions: Yes RLE Weight Bearing: Weight bearing as tolerated    Mobility  Bed Mobility Overal bed mobility: Modified Independent Bed Mobility: Supine to Sit;Sit to Supine     Supine to sit: Modified independent (Device/Increase time) Sit to supine: Modified independent (Device/Increase time)   General bed mobility comments: HBO flat, no use of rails to simulate home environment.   Transfers Overall transfer level: Needs assistance Equipment used: Rolling walker (2 wheeled) Transfers: Sit to/from Stand Sit to Stand: Modified independent (Device/Increase time);From elevated surface         General transfer comment: Bed elevated to simulate home environment. Pt adhering to hip precautions.  Ambulation/Gait Ambulation/Gait assistance: Modified independent (Device/Increase time) Ambulation Distance (Feet): 520 Feet Assistive device: Rolling walker (2 wheeled);Straight cane Gait Pattern/deviations:  Step-through pattern;Antalgic   Gait velocity interpretation: Below normal speed for age/gender General Gait Details: Cues for activation of glutes during stance phase. Attempted ambulation with SPC - slow, guarded gait.   Stairs Stairs: Yes Stairs assistance: Min assist Stair Management: No rails;Step to pattern;Backwards;With walker Number of Stairs: 2 General stair comments: Cues for technique. Therapist stabilizing RW for support.   Wheelchair Mobility    Modified Rankin (Stroke Patients Only)       Balance Overall balance assessment: Needs assistance Sitting-balance support: Feet supported;No upper extremity supported Sitting balance-Leahy Scale: Good     Standing balance support: During functional activity Standing balance-Leahy Scale: Fair Standing balance comment: Able to donn pants with some assist without UE support, no lOB.                    Cognition Arousal/Alertness: Awake/alert Behavior During Therapy: WFL for tasks assessed/performed Overall Cognitive Status: Within Functional Limits for tasks assessed                      Exercises      General Comments        Pertinent Vitals/Pain Pain Assessment: Faces Faces Pain Scale: Hurts a little bit Pain Location: right hip Pain Descriptors / Indicators: Sore;Tightness Pain Intervention(s): Monitored during session;Repositioned    Home Living                      Prior Function            PT Goals (current goals can now be found in the care plan section) Progress towards PT goals: Progressing toward goals    Frequency  7X/week    PT Plan Current plan remains appropriate    Co-evaluation  End of Session Equipment Utilized During Treatment: Gait belt Activity Tolerance: Patient tolerated treatment well Patient left: in chair;with call bell/phone within reach     Time: 0917-0940 PT Time Calculation (min) (ACUTE ONLY): 23 min  Charges:  $Gait  Training: 23-37 mins                    G Codes:      Javayah Magaw A Violet Seabury 05/11/2015, 10:21 AM  Wray Kearns, Tasley, DPT [phone]

## 2015-05-11 NOTE — Progress Notes (Signed)
Patient given discharge information and prescriptions-patient verbalized understanding. Patient assisted to car via wheelchair with assistance of volunteer services. [employer]

## 2015-05-11 NOTE — Discharge Summary (Signed)
Patient ID: Christina Griffith MRN: [id number] DOB/AGE: July 29, 1951 64 y.o.  Admit date: 05/09/2015 Discharge date: 05/11/2015  Admission Diagnoses:  Principal Problem:   Complication of internal right hip prosthesis ([place]) Active Problems:   Primary osteoarthritis of right hip   Discharge Diagnoses:  Same  Past Medical History  Diagnosis Date  . Asthma   . Hypertension   . Cold 10/03/12    pt getting over a cold--still has some head congestion, slight cough  . Arthritis     spinal stenosis--back pain--hx of herniated cervical disk- but no surgery  . H/O: hematuria     benign essential hematuria - per urology work up   . Endometrial ca ([place])   . PONV (postoperative nausea and vomiting)     with prior hip replacement  . History of pneumonia   . History of depression   . Stress incontinence   . Colon polyps   . Wears glasses   . Numbness in right leg     pt. states occassional in right leg and right big toe from disc herniations    Surgeries: Procedure(s): TOTAL HIP REVISION on 05/09/2015   Consultants:    Discharged Condition: Improved  Hospital Course: Christina Griffith is an 64 y.o. female who was admitted 05/09/2015 for operative treatment ofComplication of internal right hip prosthesis ([place]). Patient has severe unremitting pain that affects sleep, daily activities, and work/hobbies. After pre-op clearance the patient was taken to the operating room on 05/09/2015 and underwent  Procedure(s): TOTAL HIP REVISION.    Patient was given perioperative antibiotics: Anti-infectives    Start     Dose/Rate Route Frequency Ordered Stop   05/09/15 0930  ceFAZolin (ANCEF) IVPB 2 g/50 mL premix     2 g 100 mL/hr over 30 Minutes Intravenous To ShortStay Surgical 05/08/15 1146 05/09/15 1040       Patient was given sequential compression devices, early ambulation, and chemoprophylaxis to prevent DVT.  Patient benefited maximally from hospital stay and there were no  complications.    Recent vital signs: Patient Vitals for the past 24 hrs:  BP Temp Temp src Pulse Resp SpO2  05/11/15 0552 139/72 mmHg 98.2 F (36.8 C) Oral 86 20 100 %  05/10/15 2050 (!) 107/52 mmHg 98.4 F (36.9 C) Oral 86 17 100 %  05/10/15 1500 119/70 mmHg 98.6 F (37 C) - 89 16 -     Recent laboratory studies:  Recent Labs  05/10/15 0533 05/11/15 0450  WBC 6.6 8.5  HGB 11.5* 11.2*  HCT 35.3* 34.1*  PLT 186 182     Discharge Medications:     Medication List    STOP taking these medications        ibuprofen 200 MG tablet  Commonly known as:  ADVIL,MOTRIN      TAKE these medications        albuterol 108 (90 BASE) MCG/ACT inhaler  Commonly known as:  PROVENTIL HFA;VENTOLIN HFA  Inhale 2 puffs into the lungs every 6 (six) hours as needed for wheezing or shortness of breath.     aspirin EC 325 MG tablet  Take 1 tablet (325 mg total) by mouth 2 (two) times daily.     atorvastatin 10 MG tablet  Commonly known as:  LIPITOR  TAKE 1 TABLET BY MOUTH ONCE DAILY     CALCIUM 600+D 600-800 MG-UNIT Tabs  Generic drug:  Calcium Carb-Cholecalciferol  Take 1 tablet by mouth daily.     CULTURELLE Caps  Take 1  capsule by mouth daily.     docusate calcium 240 MG capsule  Commonly known as:  SURFAK  Take 240 mg by mouth daily.     fexofenadine 180 MG tablet  Commonly known as:  ALLEGRA  Take 180 mg by mouth daily as needed for allergies or rhinitis.     FIBER PO  Take 1 tablet by mouth daily.     hydrochlorothiazide 25 MG tablet  Commonly known as:  HYDRODIURIL  TAKE 1 TABLET BY MOUTH DAILY     magnesium gluconate 500 MG tablet  Commonly known as:  MAGONATE  Take 500 mg by mouth daily.     MIRALAX PO  Take 17 g by mouth as needed.     multivitamin tablet  Take 1 tablet by mouth daily.     NASACORT ALLERGY 24HR NA  Place 1 spray into the nose as needed.     oxyCODONE-acetaminophen 5-325 MG tablet  Commonly known as:  ROXICET  Take 1 tablet by mouth  every 4 (four) hours as needed.     potassium gluconate 595 MG Tabs tablet  Take 595 mg by mouth daily.     PREMARIN 0.3 MG tablet  Generic drug:  estrogens (conjugated)  Take 0.3 mg by mouth daily. Take every other day for 21 days then do not take for 7 days.     tizanidine 2 MG capsule  Commonly known as:  ZANAFLEX  Take 1 capsule (2 mg total) by mouth 3 (three) times daily.     venlafaxine XR 75 MG 24 hr capsule  Commonly known as:  EFFEXOR-XR  Take 1 capsule by mouth at bedtime.        Diagnostic Studies: Dg Chest 2 View  04/29/2015   CLINICAL DATA:  Preoperative respiratory evaluation prior to revision of right total hip arthroplasty. Current history of asthma and hypertension.  EXAM: CHEST  2 VIEW  COMPARISON:  10/03/2012.  FINDINGS: Cardiomediastinal silhouette unremarkable, unchanged. Lungs clear. Bronchovascular markings normal. Pulmonary vascularity normal. No visible pleural effusions. No pneumothorax. Degenerative disc disease and spondylosis involving the upper and mid thoracic spine. No significant interval change  IMPRESSION: No acute cardiopulmonary disease.  Stable examination.   Electronically Signed   By: Evangeline Dakin M.D.   On: 04/29/2015 10:04   Dg Pelvis Portable  05/09/2015   CLINICAL DATA:  Status post right total hip arthroplasty  EXAM: PORTABLE PELVIS 1-2 VIEWS  COMPARISON:  CT of the pelvis of Dec 14, 2014  FINDINGS: The patient has undergone revision of a right total hip arthroplasty. Radiographic positioning of the prosthetic components is good. There is gas within the surgical site.  IMPRESSION: There is no evidence of immediate postprocedure complication following revision of right total hip arthroplasty.   Electronically Signed   By: David  Martinique M.D.   On: 05/09/2015 12:59    Disposition: 01-Home or Self Care      Discharge Instructions    Call MD / Call 911    Complete by:  As directed   If you experience chest pain or shortness of breath, CALL  911 and be transported to the hospital emergency room.  If you develope a fever above 101 F, pus (white drainage) or increased drainage or redness at the wound, or calf pain, call your surgeon's office.     Change dressing    Complete by:  As directed   You may change your dressing on day 5, then change the dressing daily with sterile 4  x 4 inch gauze dressing and paper tape.  You may clean the incision with alcohol prior to redressing     Constipation Prevention    Complete by:  As directed   Drink plenty of fluids.  Prune juice may be helpful.  You may use a stool softener, such as Colace (over the counter) 100 mg twice a day.  Use MiraLax (over the counter) for constipation as needed.     Diet - low sodium heart healthy    Complete by:  As directed      Discharge instructions    Complete by:  As directed   Follow up in office with Dr. Mayer Camel in 2 weeks.     Driving restrictions    Complete by:  As directed   No driving for 2 weeks     Follow the hip precautions as taught in Physical Therapy    Complete by:  As directed      Increase activity slowly as tolerated    Complete by:  As directed      Patient may shower    Complete by:  As directed   You may shower without a dressing once there is no drainage.  Do not wash over the wound.  If drainage remains, cover wound with plastic wrap and then shower.           Follow-up Information    Follow up with Kerin Salen, MD In 2 weeks.   Specialty:  Orthopedic Surgery   Contact information:   [place]  [phone]       Follow up with [facility].   Why:  They will contact you to schedule home therapy visits.   Contact information:   [place] [address] [phone]        Signed: Theodosia Quay 05/11/2015, 7:51 AM

## 2015-05-13 DIAGNOSIS — Z96641 Presence of right artificial hip joint: Secondary | ICD-10-CM | POA: Diagnosis not present

## 2015-05-13 DIAGNOSIS — T84060D Wear of articular bearing surface of internal prosthetic right hip joint, subsequent encounter: Secondary | ICD-10-CM | POA: Diagnosis not present

## 2015-05-24 DIAGNOSIS — T84090D Other mechanical complication of internal right hip prosthesis, subsequent encounter: Secondary | ICD-10-CM | POA: Diagnosis not present

## 2015-05-30 DIAGNOSIS — R262 Difficulty in walking, not elsewhere classified: Secondary | ICD-10-CM | POA: Diagnosis not present

## 2015-05-30 DIAGNOSIS — Z96641 Presence of right artificial hip joint: Secondary | ICD-10-CM | POA: Diagnosis not present

## 2015-05-30 DIAGNOSIS — M25551 Pain in right hip: Secondary | ICD-10-CM | POA: Diagnosis not present

## 2015-06-03 DIAGNOSIS — Z96641 Presence of right artificial hip joint: Secondary | ICD-10-CM | POA: Diagnosis not present

## 2015-06-03 DIAGNOSIS — M25551 Pain in right hip: Secondary | ICD-10-CM | POA: Diagnosis not present

## 2015-06-03 DIAGNOSIS — R262 Difficulty in walking, not elsewhere classified: Secondary | ICD-10-CM | POA: Diagnosis not present

## 2015-06-06 DIAGNOSIS — M25511 Pain in right shoulder: Secondary | ICD-10-CM | POA: Diagnosis not present

## 2015-06-06 DIAGNOSIS — R262 Difficulty in walking, not elsewhere classified: Secondary | ICD-10-CM | POA: Diagnosis not present

## 2015-06-06 DIAGNOSIS — Z96641 Presence of right artificial hip joint: Secondary | ICD-10-CM | POA: Diagnosis not present

## 2015-06-09 DIAGNOSIS — Z96641 Presence of right artificial hip joint: Secondary | ICD-10-CM | POA: Diagnosis not present

## 2015-06-09 DIAGNOSIS — R262 Difficulty in walking, not elsewhere classified: Secondary | ICD-10-CM | POA: Diagnosis not present

## 2015-06-09 DIAGNOSIS — M25551 Pain in right hip: Secondary | ICD-10-CM | POA: Diagnosis not present

## 2015-06-10 ENCOUNTER — Encounter: Payer: Self-pay | Admitting: Family Medicine

## 2015-06-10 ENCOUNTER — Ambulatory Visit (INDEPENDENT_AMBULATORY_CARE_PROVIDER_SITE_OTHER): Payer: 59 | Admitting: Family Medicine

## 2015-06-10 VITALS — BP 152/93 | HR 96 | Temp 98.4°F | Ht 67.5 in | Wt 162.8 lb

## 2015-06-10 DIAGNOSIS — R300 Dysuria: Secondary | ICD-10-CM | POA: Diagnosis not present

## 2015-06-10 LAB — POCT URINALYSIS DIP (MANUAL ENTRY)
Bilirubin, UA: NEGATIVE
Glucose, UA: NEGATIVE
Ketones, POC UA: NEGATIVE
Leukocytes, UA: NEGATIVE
Nitrite, UA: NEGATIVE
Protein Ur, POC: NEGATIVE
Spec Grav, UA: 1.02
Urobilinogen, UA: 0.2
pH, UA: 7

## 2015-06-10 NOTE — Assessment & Plan Note (Signed)
No clear infection on UA but symptoms very typical... Will send for culture. May be due to bladder irritant.. Avoid acidic foods. Push fluids.

## 2015-06-10 NOTE — Addendum Note (Signed)
Addended by: Carter Kitten on: 06/10/2015 09:37 AM   Modules accepted: Orders

## 2015-06-10 NOTE — Progress Notes (Signed)
   Subjective:    Patient ID: Christina Griffith, female    DOB: 1950/12/19, 64 y.o.   MRN: [id number]  Dysuria  This is a new problem. The current episode started in the past 7 days (2 days). The problem has been rapidly worsening. The quality of the pain is described as burning. The pain is moderate. There has been no fever. Associated symptoms include frequency and urgency. Pertinent negatives include no discharge, flank pain, hematuria, nausea or vomiting. Associated symptoms comments:  crampy low abd pain. She has tried increased fluids for the symptoms. The treatment provided mild relief. Her past medical history is significant for catheterization. There is no history of kidney stones, recurrent UTIs, a single kidney, urinary stasis or a urological procedure.    Social History /Family History/Past Medical History reviewed and updated if needed.  She did have total hip revision on 10/3, was  I/o catheterized.   Laat UTI in 2014.  Review of Systems  Gastrointestinal: Negative for nausea and vomiting.  Genitourinary: Positive for dysuria, urgency and frequency. Negative for hematuria and flank pain.       Objective:   Physical Exam  Constitutional: Vital signs are normal. She appears well-developed and well-nourished. She is cooperative.  Non-toxic appearance. She does not appear ill. No distress.  HENT:  Head: Normocephalic.  Right Ear: Hearing, tympanic membrane, external ear and ear canal normal. Tympanic membrane is not erythematous, not retracted and not bulging.  Left Ear: Hearing, tympanic membrane, external ear and ear canal normal. Tympanic membrane is not erythematous, not retracted and not bulging.  Nose: Mucosal edema and rhinorrhea present. Right sinus exhibits no maxillary sinus tenderness and no frontal sinus tenderness. Left sinus exhibits no maxillary sinus tenderness and no frontal sinus tenderness.  Mouth/Throat: Uvula is midline, oropharynx is clear and moist and  mucous membranes are normal.  Eyes: Conjunctivae, EOM and lids are normal. Pupils are equal, round, and reactive to light. Lids are everted and swept, no foreign bodies found.  Neck: Trachea normal and normal range of motion. Neck supple. Carotid bruit is not present. No thyroid mass and no thyromegaly present.  Cardiovascular: Normal rate, regular rhythm, S1 normal, S2 normal, normal heart sounds, intact distal pulses and normal pulses.  Exam reveals no gallop and no friction rub.   No murmur heard. Pulmonary/Chest: Effort normal and breath sounds normal. No tachypnea. No respiratory distress. She has no decreased breath sounds. She has no wheezes. She has no rhonchi. She has no rales.  Abdominal: There is no hepatosplenomegaly. There is tenderness in the suprapubic area. There is no CVA tenderness.  Neurological: She is alert.  Skin: Skin is warm, dry and intact. No rash noted.  Psychiatric: Her speech is normal and behavior is normal. Judgment normal. Her mood appears not anxious. Cognition and memory are normal. She does not exhibit a depressed mood.          Assessment & Plan:

## 2015-06-10 NOTE — Patient Instructions (Signed)
Push fluids. Avoid bladder spasm triggers like caffeine, citris, alcohol, tomato, any thing that acidifies urine.  We will call with culture results when they return.

## 2015-06-10 NOTE — Progress Notes (Signed)
Pre visit review using our clinic review tool, if applicable. No additional management support is needed unless otherwise documented below in the visit note. 

## 2015-06-13 ENCOUNTER — Other Ambulatory Visit: Payer: Self-pay | Admitting: *Deleted

## 2015-06-13 DIAGNOSIS — M25551 Pain in right hip: Secondary | ICD-10-CM | POA: Diagnosis not present

## 2015-06-13 DIAGNOSIS — R262 Difficulty in walking, not elsewhere classified: Secondary | ICD-10-CM | POA: Diagnosis not present

## 2015-06-13 DIAGNOSIS — Z96641 Presence of right artificial hip joint: Secondary | ICD-10-CM | POA: Diagnosis not present

## 2015-06-13 LAB — URINE CULTURE: Colony Count: 70000

## 2015-06-13 MED ORDER — SULFAMETHOXAZOLE-TRIMETHOPRIM 800-160 MG PO TABS: 1.0000 | ORAL_TABLET | Freq: Two times a day (BID) | ORAL | Status: DC

## 2015-06-14 ENCOUNTER — Other Ambulatory Visit: Payer: Self-pay | Admitting: Gynecologic Oncology

## 2015-06-16 DIAGNOSIS — M25551 Pain in right hip: Secondary | ICD-10-CM | POA: Diagnosis not present

## 2015-06-16 DIAGNOSIS — Z96641 Presence of right artificial hip joint: Secondary | ICD-10-CM | POA: Diagnosis not present

## 2015-06-16 DIAGNOSIS — R262 Difficulty in walking, not elsewhere classified: Secondary | ICD-10-CM | POA: Diagnosis not present

## 2015-06-21 DIAGNOSIS — Z96641 Presence of right artificial hip joint: Secondary | ICD-10-CM | POA: Diagnosis not present

## 2015-06-21 DIAGNOSIS — R262 Difficulty in walking, not elsewhere classified: Secondary | ICD-10-CM | POA: Diagnosis not present

## 2015-06-21 DIAGNOSIS — M25551 Pain in right hip: Secondary | ICD-10-CM | POA: Diagnosis not present

## 2015-06-23 DIAGNOSIS — Z1211 Encounter for screening for malignant neoplasm of colon: Secondary | ICD-10-CM | POA: Diagnosis not present

## 2015-06-23 DIAGNOSIS — Z96641 Presence of right artificial hip joint: Secondary | ICD-10-CM | POA: Diagnosis not present

## 2015-06-23 DIAGNOSIS — Z800 Family history of malignant neoplasm of digestive organs: Secondary | ICD-10-CM | POA: Diagnosis not present

## 2015-06-23 DIAGNOSIS — D509 Iron deficiency anemia, unspecified: Secondary | ICD-10-CM | POA: Diagnosis not present

## 2015-06-23 DIAGNOSIS — M25551 Pain in right hip: Secondary | ICD-10-CM | POA: Diagnosis not present

## 2015-06-23 DIAGNOSIS — R262 Difficulty in walking, not elsewhere classified: Secondary | ICD-10-CM | POA: Diagnosis not present

## 2015-07-06 ENCOUNTER — Ambulatory Visit: Payer: 59 | Attending: Gynecologic Oncology | Admitting: Gynecologic Oncology

## 2015-07-06 ENCOUNTER — Encounter: Payer: Self-pay | Admitting: Gynecologic Oncology

## 2015-07-06 VITALS — BP 151/83 | HR 103 | Temp 98.6°F | Resp 18 | Ht 67.5 in | Wt 160.9 lb

## 2015-07-06 DIAGNOSIS — Z8542 Personal history of malignant neoplasm of other parts of uterus: Secondary | ICD-10-CM | POA: Diagnosis not present

## 2015-07-06 DIAGNOSIS — C541 Malignant neoplasm of endometrium: Secondary | ICD-10-CM | POA: Insufficient documentation

## 2015-07-06 DIAGNOSIS — N951 Menopausal and female climacteric states: Secondary | ICD-10-CM

## 2015-07-06 NOTE — Progress Notes (Signed)
Consult Note: Gyn-Onc  Christina Griffith 64 y.o. female  CC:  Chief Complaint  Patient presents with  . endometrial cancer    F/U    HPI: Christina Griffith is a 64 y.o. year old initially seen in consultation for grade 1 endometrial cancer She then underwent a [place] BSO BPLND on 0000000 without complications. Her final pathologic diagnosis is a Stage 1A Grade 1 endometrioid endometrial cancer with no lymphovascular space invasion, no myometrial invasion and negative lymph nodes.  Interval History:  I last saw her in 11/15. In the interim she saw Dr. Nori Riis and had a negative exam and negative Pap smear in April. She continues to have vasomotor symptoms 5-6 hot flashes per day she's unsure of a change in severity but she would like to stay on the Effexor she believes she is benefiting from that. She does feel like she sweats more overall. She is up-to-date on her mammograms. She is scheduled for a colonoscopy December 30. She had cataracts done this summer and that helped her vision significantly. She also had the ball of her right hip replaced approximately 8 weeks ago. They discontinued her Fosamax as they have noted a slowing of the osteolytic changes in the acetabulum. There are no new medical problems and her family.  Review of Systems  Constitutional: Denies fever. Continued vasomotor symptoms. Skin: No rash Cardiovascular: No chest pain, shortness of breath, or edema  Pulmonary: No cough Gastro Intestinal:  No nausea, vomiting, mild constipation, or diarrhea reported. No bright red blood per rectum or change in bowel movement.  Genitourinary: No frequency, urgency, or dysuria.  Denies vaginal bleeding and discharge.  Musculoskeletal: Recovering from her hip surgery. Psychology: Improved with effexor.  Current Meds:  Outpatient Encounter Prescriptions as of 07/06/2015  Medication Sig  . alendronate (FOSAMAX) 70 MG tablet Take 70 mg by mouth once a week.   Marland Kitchen atorvastatin  (LIPITOR) 10 MG tablet TAKE 1 TABLET BY MOUTH ONCE DAILY  . Calcium Carb-Cholecalciferol (CALCIUM 600+D) 600-800 MG-UNIT TABS Take 1 tablet by mouth daily.  Marland Kitchen docusate calcium (SURFAK) 240 MG capsule Take 240 mg by mouth daily.  . fexofenadine (ALLEGRA) 180 MG tablet Take 180 mg by mouth daily as needed for allergies or rhinitis.  . hydrochlorothiazide (HYDRODIURIL) 25 MG tablet TAKE 1 TABLET BY MOUTH DAILY  . ibuprofen (ADVIL,MOTRIN) 200 MG tablet Take 600 mg by mouth every 8 (eight) hours as needed.  . Lactobacillus Rhamnosus, GG, (CULTURELLE) CAPS Take 1 capsule by mouth daily.  . magnesium gluconate (MAGONATE) 500 MG tablet Take 500 mg by mouth daily.  . Multiple Vitamin (MULTIVITAMIN) tablet Take 1 tablet by mouth daily.  . Polyethylene Glycol 3350 (MIRALAX PO) Take 17 g by mouth as needed.   . potassium gluconate 595 MG TABS Take 595 mg by mouth daily.  Marland Kitchen PREMARIN 0.3 MG tablet TAKE 1 TABLET BY MOUTH ONCE DAILY FOR 21 DAYS, THEN DO NOT TAKE FOR 7 DAYS.  Marland Kitchen venlafaxine XR (EFFEXOR-XR) 75 MG 24 hr capsule Take 1 capsule by mouth at bedtime.  Marland Kitchen albuterol (PROVENTIL HFA;VENTOLIN HFA) 108 (90 BASE) MCG/ACT inhaler Inhale 2 puffs into the lungs every 6 (six) hours as needed for wheezing or shortness of breath. (Patient not taking: Reported on 07/06/2015)  . traMADol (ULTRAM) 50 MG tablet Take 50 mg by mouth 2 (two) times daily.   . Triamcinolone Acetonide (NASACORT ALLERGY 24HR NA) Place 1 spray into the nose as needed.   . [DISCONTINUED] FIBER PO Take 1  tablet by mouth daily.  . [DISCONTINUED] sulfamethoxazole-trimethoprim (BACTRIM DS,SEPTRA DS) 800-160 MG tablet Take 1 tablet by mouth 2 (two) times daily.  . [DISCONTINUED] tizanidine (ZANAFLEX) 2 MG capsule Take 1 capsule (2 mg total) by mouth 3 (three) times daily.   No facility-administered encounter medications on file as of 07/06/2015.    Allergy: No Known Allergies  Social Hx:   Social History   Social History  . Marital Status:  Single    Spouse Name: N/A  . Number of Children: N/A  . Years of Education: N/A   Occupational History  . nurse practioner    Social History Main Topics  . Smoking status: Never Smoker   . Smokeless tobacco: Never Used  . Alcohol Use: Yes     Comment: 3 x a week  . Drug Use: No  . Sexual Activity: Not on file   Other Topics Concern  . Not on file   Social History Narrative   Regular exercise- yes, 5-6 days a week, yoga   Diet: healthy    Past Surgical Hx:  Past Surgical History  Procedure Laterality Date  . Total hip arthroplasty      right 2002 left 2004  . Hysteroscopy w/d&c Left 1/24/204    w/ resection of Endometrial polyps  . Robotic assisted total hysterectomy with bilateral salpingo oopherectomy N/A 10/07/2012    Procedure: ROBOTIC ASSISTED TOTAL HYSTERECTOMY WITH BILATERAL SALPINGO OOPHORECTOMY/POSSIBLE LYMPH NODE BIOPSY;  Surgeon: Imagene Gurney A. Alycia Rossetti, MD;  Location: [facility];  Service: Gynecology;  Laterality: N/A;  . Breast surgery Right 1980's    breast biopsy  . Eye surgery Bilateral 02/2015    cataracts  . Colonoscopy    . Total hip revision Right 05/09/2015    Procedure: TOTAL HIP REVISION;  Surgeon: Frederik Pear, MD;  Location: [facility];  Service: Orthopedics;  Laterality: Right;    Past Medical Hx:  Past Medical History  Diagnosis Date  . Asthma   . Hypertension   . Cold 10/03/12    pt getting over a cold--still has some head congestion, slight cough  . Arthritis     spinal stenosis--back pain--hx of herniated cervical disk- but no surgery  . H/O: hematuria     benign essential hematuria - per urology work up   . Endometrial ca ([place])   . PONV (postoperative nausea and vomiting)     with prior hip replacement  . History of pneumonia   . History of depression   . Stress incontinence   . Colon polyps   . Wears glasses   . Numbness in right leg     pt. states occassional in right leg and right big toe from disc herniations    Oncology Hx:     Endometrial cancer ([place])   10/07/2012 Surgery RTH/BSO adn BPLND. IA grade 1    Family Hx:  Family History  Problem Relation Age of Onset  . Hypertension Mother   . Cancer Father     bladder and prostate  . Hyperlipidemia Father   . Cancer Maternal Grandmother     colon  . Breast cancer Cousin     Vitals:  Blood pressure 151/83, pulse 103, temperature 98.6 F (37 C), temperature source Oral, resp. rate 18, height 5' 7.5" (1.715 m), weight 160 lb 14.4 oz (72.984 kg), SpO2 99 %.  Physical Exam: Well-nourished well-developed female in no acute distress.  Neck: Supple, no lymphadenopathy no thyromegaly.   Lungs: Clear to auscultation.  Cardiovascular: Regular rate and  rhythm.  Abdomen: Well-healed incisions. Abdomen is soft, nontender, nondistended. There are no palpable masses or hepatosplenomegaly.  Extremities: No edema.  Groins: No lymphadenopathy.  Pelvic: Normal external female genitalia. Vagina slightly atrophic. There are no visible lesions. Bimanual examination reveals no masses or nodularity. Rectal confirms  Assessment/Plan: 64 y.o. year old with Stage IA Grade 1 endometrioid endometrial cancer.   Plan:  1) She will continue her Effexor. 3) She'll follow-up with her physicians as scheduled.  4) She will follow up with Dr. Nori Riis in 6 months and return to see [place] in one year.  Chamille Werntz A., MD 07/06/2015, 2:26 PM

## 2015-07-06 NOTE — Patient Instructions (Signed)
Follow-up with her gynecologist in 6 months and return to see Dr. Alycia Rossetti in 1 year. Follow-up with her other physicians as scheduled. Happy holidays!

## 2015-08-05 DIAGNOSIS — Z1211 Encounter for screening for malignant neoplasm of colon: Secondary | ICD-10-CM | POA: Diagnosis not present

## 2015-08-05 DIAGNOSIS — K635 Polyp of colon: Secondary | ICD-10-CM | POA: Diagnosis not present

## 2015-08-11 MED FILL — VENLAFAXINE HCL ER 75 MG CA: 75 | 30 days supply | Qty: 30 | Fill #0

## 2015-08-11 MED FILL — ALENDRONATE NA 70 MG TAB: 70 | 84 days supply | Qty: 12 | Fill #1

## 2015-08-15 ENCOUNTER — Encounter: Payer: Self-pay | Admitting: Gastroenterology

## 2015-09-14 MED FILL — PREMARIN 0.3 MG TABLET: 0.3 | 28 days supply | Qty: 21 | Fill #2

## 2015-09-14 MED FILL — VENLAFAXINE HCL ER 75 MG CA: 75 | 30 days supply | Qty: 30 | Fill #1

## 2015-09-16 DIAGNOSIS — Z1231 Encounter for screening mammogram for malignant neoplasm of breast: Secondary | ICD-10-CM | POA: Diagnosis not present

## 2015-09-16 DIAGNOSIS — Z803 Family history of malignant neoplasm of breast: Secondary | ICD-10-CM | POA: Diagnosis not present

## 2015-09-19 ENCOUNTER — Encounter: Payer: Self-pay | Admitting: Family Medicine

## 2015-10-05 MED FILL — ATORVASTATIN 10 MG TABLET: 10 | 90 days supply | Qty: 90 | Fill #2

## 2015-10-12 ENCOUNTER — Other Ambulatory Visit: Payer: Self-pay | Admitting: Family Medicine

## 2015-10-12 MED FILL — VENLAFAXINE HCL ER 75 MG CA: 75 | 30 days supply | Qty: 30 | Fill #2

## 2015-10-12 MED FILL — HYDROCHLOROTHIAZIDE 25 MG T: 25 | 90 days supply | Qty: 90 | Fill #0

## 2015-10-25 DIAGNOSIS — T84090D Other mechanical complication of internal right hip prosthesis, subsequent encounter: Secondary | ICD-10-CM | POA: Diagnosis not present

## 2015-11-06 IMAGING — NM NM BONE 3 PHASE
2 series · 12 of 12 positions shown · non-contrast
Comparison: Lumbar MRI 12/25/2013 CT 12/10/2003 by

CLINICAL DATA: Right hip hip pain and groin pain. Bilateral hip
arthroplasty.

EXAM:
NUCLEAR MEDICINE 3-PHASE BONE SCAN
TECHNIQUE: Radionuclide angiographic images, immediate static blood pool
images, and 3-hour delayed static images were obtained of the pelvis
after intravenous injection of radiopharmaceutical.
RADIOPHARMACEUTICALS:  24.9 mCi Technetium 99 MDP

[Series 1: fl flow and static · 4.75mm/px · 6 of 40 frames shown (1 of 2)]
[frame 4/40  full-range]
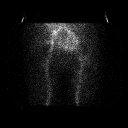
[frame 10/40  full-range]
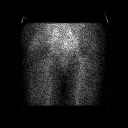
[frame 17/40  full-range]
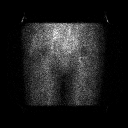
[frame 24/40  full-range]
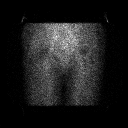
[frame 30/40  full-range]
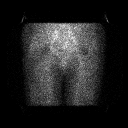
[frame 37/40  full-range]
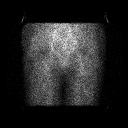

[Series 1: fl flow and static · 4.75mm/px · 6 of 40 frames shown (2 of 2)]
[frame 4/40  full-range]
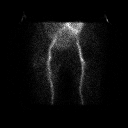
[frame 10/40  full-range]
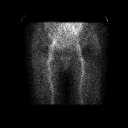
[frame 17/40  full-range]
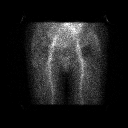
[frame 24/40  full-range]
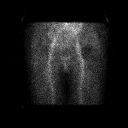
[frame 30/40  full-range]
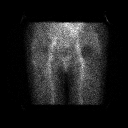
[frame 37/40  full-range]
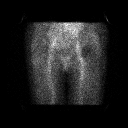

[12 of 12 positions shown; findings below may reference images not displayed]

FINDINGS: Vascular phase: No asymmetric or increased vascular flow to the left
or right hip.

Blood pool phase: Photopenia noted associated with the bilateral hip
prosthetics. No abnormal blood pool activity.

Delayed phase: There is asymmetric uptake within the proximal right
femur at the level of the lesser trochanter which is circumferential
around the prosthetic. No uptake at the tip of the femoral stem
prosthetic. No abnormal uptake within the left hip prosthetic.
IMPRESSION: Uptake within the proximal aspect of the right femur on the delayed
imaging could represent stress reaction. No evidence of loosening or
infection of the left or right hip prosthetic.

## 2015-11-09 MED FILL — VENLAFAXINE HCL ER 75 MG CA: 75 | 30 days supply | Qty: 30 | Fill #3

## 2015-11-09 MED FILL — PREMARIN 0.3 MG TABLET: 0.3 | 28 days supply | Qty: 21 | Fill #3

## 2015-12-06 DIAGNOSIS — Z01419 Encounter for gynecological examination (general) (routine) without abnormal findings: Secondary | ICD-10-CM | POA: Diagnosis not present

## 2015-12-06 DIAGNOSIS — Z13820 Encounter for screening for osteoporosis: Secondary | ICD-10-CM | POA: Diagnosis not present

## 2015-12-06 DIAGNOSIS — Z6825 Body mass index (BMI) 25.0-25.9, adult: Secondary | ICD-10-CM | POA: Diagnosis not present

## 2015-12-14 MED FILL — VENLAFAXINE HCL ER 75 MG CA: 75 | 30 days supply | Qty: 30 | Fill #4

## 2016-01-09 MED FILL — VENLAFAXINE HCL ER 75 MG CA: 75 | 30 days supply | Qty: 30 | Fill #5

## 2016-01-09 MED FILL — ATORVASTATIN 10 MG TABLET: 10 | 90 days supply | Qty: 90 | Fill #3

## 2016-01-09 MED FILL — PREMARIN 0.3 MG TABLET: 0.3 | 28 days supply | Qty: 21 | Fill #4

## 2016-01-18 MED FILL — HYDROCHLOROTHIAZIDE 25 MG T: 25 | 90 days supply | Qty: 90 | Fill #1

## 2016-02-06 MED FILL — PREMARIN 0.3 MG TABLET: 0.3 | 28 days supply | Qty: 21 | Fill #5

## 2016-02-06 MED FILL — VENLAFAXINE HCL ER 75 MG CA: 75 | 30 days supply | Qty: 30 | Fill #6

## 2016-02-10 ENCOUNTER — Encounter: Payer: Self-pay | Admitting: Family Medicine

## 2016-02-10 ENCOUNTER — Ambulatory Visit (INDEPENDENT_AMBULATORY_CARE_PROVIDER_SITE_OTHER): Payer: 59 | Admitting: Family Medicine

## 2016-02-10 VITALS — BP 140/80 | HR 90 | Temp 98.4°F | Ht 67.5 in | Wt 163.5 lb

## 2016-02-10 DIAGNOSIS — R040 Epistaxis: Secondary | ICD-10-CM | POA: Diagnosis not present

## 2016-02-10 LAB — CBC WITH DIFFERENTIAL/PLATELET
Basophils Absolute: 0 cells/uL (ref 0–200)
Basophils Relative: 0 %
Eosinophils Absolute: 204 cells/uL (ref 15–500)
Eosinophils Relative: 3 %
HCT: 40.5 % (ref 35.0–45.0)
Hemoglobin: 13.5 g/dL (ref 11.7–15.5)
Lymphocytes Relative: 37 %
Lymphs Abs: 2516 cells/uL (ref 850–3900)
MCH: 30.9 pg (ref 27.0–33.0)
MCHC: 33.3 g/dL (ref 32.0–36.0)
MCV: 92.7 fL (ref 80.0–100.0)
MPV: 10 fL (ref 7.5–12.5)
Monocytes Absolute: 680 cells/uL (ref 200–950)
Monocytes Relative: 10 %
Neutro Abs: 3400 cells/uL (ref 1500–7800)
Neutrophils Relative %: 50 %
Platelets: 252 10*3/uL (ref 140–400)
RBC: 4.37 MIL/uL (ref 3.80–5.10)
RDW: 13.2 % (ref 11.0–15.0)
WBC: 6.8 10*3/uL (ref 3.8–10.8)

## 2016-02-10 NOTE — Addendum Note (Signed)
Addended by: Royann Shivers A on: 02/10/2016 04:57 PM   Modules accepted: Orders

## 2016-02-10 NOTE — Assessment & Plan Note (Signed)
Refer to ENT for cautery. Eval cbc . No red flags for blood disorder. No current indication for packing, no current bleeding.

## 2016-02-10 NOTE — Progress Notes (Signed)
Pre visit review using our clinic review tool, if applicable. No additional management support is needed unless otherwise documented below in the visit note. 

## 2016-02-10 NOTE — Patient Instructions (Addendum)
Stop at front desk for referral to ENT.  Be careful not to dislodge the clot.  Hold ibuprofen until see ENT. Stop at lab on way out. Schedule AMW with labs prior for after 03/28/2016.

## 2016-02-10 NOTE — Progress Notes (Signed)
   Subjective:    Patient ID: Christina Griffith, female    DOB: [birth year], 65 y.o.   MRN: [id number]  HPI  65 year old female with history of allergic rhinitis, HTN presents with recurrent nosebleeds,  12 since 12/2015. Her allergies are well controlled on no medicine.  No headache, no dizziness.    First 2 were in [place], where it was dry. But has had many since.  Under control in 10 minutes.  Always occuring on right nares. Bleed is forward.  No flu like symptoms, no unexpected weight loss, no unexpected bruising.  No new meds.  Occ using ibuprofen, 600 mg 2 times a day for few days. Not more than usual.    BP at home well controlled  130/83. On HCTZ. BP Readings from Last 3 Encounters:  02/10/16 140/80  07/06/15 151/83  06/10/15 152/93    Social History /Family History/Past Medical History reviewed and updated if needed. No history of similar.    Review of Systems  Constitutional: Negative for fever and fatigue.  HENT: Negative for ear pain.   Eyes: Negative for pain.  Respiratory: Negative for chest tightness and shortness of breath.   Cardiovascular: Negative for chest pain, palpitations and leg swelling.  Gastrointestinal: Negative for abdominal pain.  Genitourinary: Negative for dysuria.       Objective:   Physical Exam  Constitutional: Vital signs are normal. She appears well-developed and well-nourished. She is cooperative.  Non-toxic appearance. She does not appear ill. No distress.  HENT:  Head: Normocephalic.  Right Ear: Hearing, tympanic membrane, external ear and ear canal normal. Tympanic membrane is not erythematous, not retracted and not bulging.  Left Ear: Hearing, tympanic membrane, external ear and ear canal normal. Tympanic membrane is not erythematous, not retracted and not bulging.  Nose: No mucosal edema, rhinorrhea or nasal deformity. No epistaxis. Right sinus exhibits no maxillary sinus tenderness and no frontal sinus tenderness. Left  sinus exhibits no maxillary sinus tenderness and no frontal sinus tenderness.  Mouth/Throat: Uvula is midline, oropharynx is clear and moist and mucous membranes are normal.  left nare lateral wall with several blood vessels close to surface, no current oozing or scab. Some bloody mucus  Eyes: Conjunctivae, EOM and lids are normal. Pupils are equal, round, and reactive to light. Lids are everted and swept, no foreign bodies found.  Neck: Trachea normal and normal range of motion. Neck supple. Carotid bruit is not present. No thyroid mass and no thyromegaly present.  Cardiovascular: Normal rate, regular rhythm, S1 normal, S2 normal, normal heart sounds, intact distal pulses and normal pulses.  Exam reveals no gallop and no friction rub.   No murmur heard. Pulmonary/Chest: Effort normal and breath sounds normal. No tachypnea. No respiratory distress. She has no decreased breath sounds. She has no wheezes. She has no rhonchi. She has no rales.  Abdominal: Soft. Normal appearance and bowel sounds are normal. There is no tenderness.  Neurological: She is alert.  Skin: Skin is warm, dry and intact. No rash noted.  Psychiatric: Her speech is normal and behavior is normal. Judgment and thought content normal. Her mood appears not anxious. Cognition and memory are normal. She does not exhibit a depressed mood.          Assessment & Plan:

## 2016-03-01 MED FILL — PREMARIN 0.3 MG TABLET: 0.3 | 28 days supply | Qty: 21 | Fill #6

## 2016-03-05 ENCOUNTER — Encounter: Payer: Self-pay | Admitting: Family Medicine

## 2016-03-06 ENCOUNTER — Other Ambulatory Visit: Payer: Self-pay | Admitting: Family Medicine

## 2016-03-06 DIAGNOSIS — R040 Epistaxis: Secondary | ICD-10-CM | POA: Diagnosis not present

## 2016-03-06 MED ORDER — LISINOPRIL-HYDROCHLOROTHIAZIDE 10-12.5 MG PO TABS: 1.0000 | ORAL_TABLET | Freq: Every day | ORAL | 11 refills | Status: DC

## 2016-03-06 MED FILL — LISINOPRIL-HCTZ 10-12.5 MG: 10-12.5 | 90 days supply | Qty: 90 | Fill #0

## 2016-03-07 ENCOUNTER — Other Ambulatory Visit: Payer: Self-pay | Admitting: Family Medicine

## 2016-03-12 MED FILL — VENLAFAXINE HCL ER 75 MG CA: 75 | 30 days supply | Qty: 30 | Fill #7

## 2016-03-23 ENCOUNTER — Ambulatory Visit (INDEPENDENT_AMBULATORY_CARE_PROVIDER_SITE_OTHER): Payer: 59 | Admitting: Family Medicine

## 2016-03-23 DIAGNOSIS — I10 Essential (primary) hypertension: Secondary | ICD-10-CM

## 2016-03-23 LAB — BASIC METABOLIC PANEL
BUN: 14 mg/dL (ref 6–23)
CO2: 30 mEq/L (ref 19–32)
Calcium: 9.7 mg/dL (ref 8.4–10.5)
Chloride: 102 mEq/L (ref 96–112)
Creatinine, Ser: 0.71 mg/dL (ref 0.40–1.20)
GFR: 87.45 mL/min (ref >60.00)
Glucose, Bld: 94 mg/dL (ref 70–99)
Potassium: 3.8 mEq/L (ref 3.5–5.1)
Sodium: 139 mEq/L (ref 135–145)

## 2016-03-23 NOTE — Progress Notes (Signed)
   Subjective:    Patient ID: Christina Griffith, female    DOB: [birth year], 65 y.o.   MRN: [id number]  HPI   65 year old female presents for BP follow up, 2 weeks.   Hypertension:  Now on lisinopril HCTZ daily instead of HCTZ alone. She feels better overall, less anxious , pulse lower. Now feeling more relaxed. Occ dry cough, not bothersome.  BP Readings from Last 3 Encounters:  03/23/16 140/89  02/10/16 140/80  07/06/15 (!) 151/83   Using medication without problems or lightheadedness:  Chest pain with exertion: None Edema:None Short of breath:None Average home BPs:129-134/77-90    On low sodium diet. She has held potassium. Other issues:     Review of Systems  Constitutional: Negative for fatigue and fever.  HENT: Negative for ear pain.   Eyes: Negative for pain.  Respiratory: Negative for chest tightness and shortness of breath.   Cardiovascular: Negative for chest pain, palpitations and leg swelling.  Gastrointestinal: Negative for abdominal pain.  Genitourinary: Negative for dysuria.       Objective:   Physical Exam  Constitutional: Vital signs are normal. She appears well-developed and well-nourished. She is cooperative.  Non-toxic appearance. She does not appear ill. No distress.  HENT:  Head: Normocephalic.  Right Ear: Hearing, tympanic membrane, external ear and ear canal normal. Tympanic membrane is not erythematous, not retracted and not bulging.  Left Ear: Hearing, tympanic membrane, external ear and ear canal normal. Tympanic membrane is not erythematous, not retracted and not bulging.  Nose: No mucosal edema or rhinorrhea. Right sinus exhibits no maxillary sinus tenderness and no frontal sinus tenderness. Left sinus exhibits no maxillary sinus tenderness and no frontal sinus tenderness.  Mouth/Throat: Uvula is midline, oropharynx is clear and moist and mucous membranes are normal.  Eyes: Conjunctivae, EOM and lids are normal. Pupils are equal, round,  and reactive to light. Lids are everted and swept, no foreign bodies found.  Neck: Trachea normal and normal range of motion. Neck supple. Carotid bruit is not present. No thyroid mass and no thyromegaly present.  Cardiovascular: Normal rate, regular rhythm, S1 normal, S2 normal, normal heart sounds, intact distal pulses and normal pulses.  Exam reveals no gallop and no friction rub.   No murmur heard. Pulmonary/Chest: Effort normal and breath sounds normal. No tachypnea. No respiratory distress. She has no decreased breath sounds. She has no wheezes. She has no rhonchi. She has no rales.  Abdominal: Soft. Normal appearance and bowel sounds are normal. There is no tenderness.  Neurological: She is alert.  Skin: Skin is warm, dry and intact. No rash noted.  Psychiatric: Her speech is normal and behavior is normal. Judgment and thought content normal. Her mood appears not anxious. Cognition and memory are normal. She does not exhibit a depressed mood.          Assessment & Plan:

## 2016-03-23 NOTE — Assessment & Plan Note (Signed)
Improved control, but borderline diastolic on new med.  Follow BP, if not at goal at next OV in 10 days.. Will consider increasing.  Check potassium and Cr on new medication.

## 2016-03-23 NOTE — Progress Notes (Signed)
Pre visit review using our clinic review tool, if applicable. No additional management support is needed unless otherwise documented below in the visit note. 

## 2016-03-23 NOTE — Patient Instructions (Signed)
Stop at lab on way out.  Continue lisinopril/HCTZ for now.  Keep appt as scheduled.

## 2016-03-23 NOTE — Addendum Note (Signed)
Addended byEliezer Lofts E on: 03/23/2016 12:13 PM   Modules accepted: Orders

## 2016-03-27 ENCOUNTER — Other Ambulatory Visit (INDEPENDENT_AMBULATORY_CARE_PROVIDER_SITE_OTHER): Payer: 59

## 2016-03-27 ENCOUNTER — Telehealth: Payer: Self-pay | Admitting: Family Medicine

## 2016-03-27 DIAGNOSIS — E7800 Pure hypercholesterolemia, unspecified: Secondary | ICD-10-CM | POA: Diagnosis not present

## 2016-03-27 LAB — COMPREHENSIVE METABOLIC PANEL
ALT: 24 U/L (ref 0–35)
AST: 27 U/L (ref 0–37)
Albumin: 4.3 g/dL (ref 3.5–5.2)
Alkaline Phosphatase: 60 U/L (ref 39–117)
BUN: 16 mg/dL (ref 6–23)
CO2: 32 mEq/L (ref 19–32)
Calcium: 9.2 mg/dL (ref 8.4–10.5)
Chloride: 102 mEq/L (ref 96–112)
Creatinine, Ser: 0.76 mg/dL (ref 0.40–1.20)
GFR: 80.84 mL/min (ref >60.00)
Glucose, Bld: 91 mg/dL (ref 70–99)
Potassium: 4.2 mEq/L (ref 3.5–5.1)
Sodium: 140 mEq/L (ref 135–145)
Total Bilirubin: 0.4 mg/dL (ref 0.2–1.2)
Total Protein: 7 g/dL (ref 6.0–8.3)

## 2016-03-27 LAB — LIPID PANEL
Cholesterol: 177 mg/dL (ref 0–200)
HDL: 65.6 mg/dL (ref >39.00)
LDL Cholesterol: 87 mg/dL (ref 0–99)
NonHDL: 111.41
Total CHOL/HDL Ratio: 3
Triglycerides: 122 mg/dL (ref 0.0–149.0)
VLDL: 24.4 mg/dL (ref 0.0–40.0)

## 2016-03-27 NOTE — Telephone Encounter (Signed)
-----   Message from Marchia Bond sent at 03/22/2016  9:54 AM EDT ----- Regarding: Cpx labs Tues 8/22, need orders. Thanks! :-) Please order  future cpx labs for pt's upcoming lab appt. Thanks Aniceto Boss

## 2016-03-28 MED FILL — PREMARIN 0.3 MG TABLET: 0.3 | 28 days supply | Qty: 21 | Fill #7

## 2016-04-03 ENCOUNTER — Encounter: Payer: Self-pay | Admitting: Family Medicine

## 2016-04-03 ENCOUNTER — Other Ambulatory Visit: Payer: Self-pay | Admitting: Family Medicine

## 2016-04-03 ENCOUNTER — Ambulatory Visit: Payer: 59

## 2016-04-03 ENCOUNTER — Ambulatory Visit (INDEPENDENT_AMBULATORY_CARE_PROVIDER_SITE_OTHER): Payer: 59 | Admitting: Family Medicine

## 2016-04-03 VITALS — BP 122/66 | HR 83 | Temp 98.7°F | Ht 67.5 in | Wt 165.5 lb

## 2016-04-03 DIAGNOSIS — C541 Malignant neoplasm of endometrium: Secondary | ICD-10-CM

## 2016-04-03 DIAGNOSIS — Z0000 Encounter for general adult medical examination without abnormal findings: Secondary | ICD-10-CM

## 2016-04-03 DIAGNOSIS — I10 Essential (primary) hypertension: Secondary | ICD-10-CM

## 2016-04-03 DIAGNOSIS — J4520 Mild intermittent asthma, uncomplicated: Secondary | ICD-10-CM

## 2016-04-03 DIAGNOSIS — E7800 Pure hypercholesterolemia, unspecified: Secondary | ICD-10-CM | POA: Diagnosis not present

## 2016-04-03 MED ORDER — LOSARTAN POTASSIUM-HCTZ 100-25 MG PO TABS: 1.0000 | ORAL_TABLET | Freq: Every day | ORAL | 3 refills | Status: DC

## 2016-04-03 MED FILL — ATORVASTATIN 10 MG TABLET: 10 | 90 days supply | Qty: 90 | Fill #0

## 2016-04-03 MED FILL — LOSARTAN-HCTZ 100-25 MG TAB: 100-25 | 30 days supply | Qty: 30 | Fill #0

## 2016-04-03 NOTE — Assessment & Plan Note (Addendum)
Inadequate control and SE to lisinopril.Pricilla Holm to higher dose of losartan  HCTZ. Check Cr in 2 weeks.

## 2016-04-03 NOTE — Assessment & Plan Note (Signed)
Well controlled. Continue current medication.  

## 2016-04-03 NOTE — Progress Notes (Signed)
Pre visit review using our clinic review tool, if applicable. No additional management support is needed unless otherwise documented below in the visit note. 

## 2016-04-03 NOTE — Assessment & Plan Note (Signed)
GYN ONC: Dr. Syble Creek.. Followed every 6 months.  GYN is Dr. Nori Riis

## 2016-04-03 NOTE — Assessment & Plan Note (Signed)
No recent flares 

## 2016-04-03 NOTE — Patient Instructions (Addendum)
Cancel Appt  In September with Kristeen Miss.  Stop lisinopril HCTZ and change to losartan HCTZ 100/25 mg daily.  Follow BP at home,  Call measurements in 2 weeks and return for lab only check.

## 2016-04-03 NOTE — Progress Notes (Signed)
The patient is here for annual wellness exam and preventative care.   Grade 1 endometrial cancer She then underwent a [place] BSO BPLND on 0000000 without complications. Her final pathologic diagnosis is a Stage 1A Grade 1 endometrioid endometrial cancer with no lymphovascular space invasion, no myometrial invasion and negative lymph nodes.  GYN ONC: Dr. Syble Creek.. Followed every 6 months.  GYN is Dr. Nori Riis   On effexor for menopausal syndrome. SE off hormones, now back to once a day. She is also using estroven with some improvement in hot flashes as well as premarin.  Quality is improved. Has also helped her mood.   Wt Readings from Last 3 Encounters:  04/03/16 165 lb 8 oz (75.1 kg)  03/23/16 163 lb 4 oz (74 kg)  02/10/16 163 lb 8 oz (74.2 kg)   Hypertension: Borderline control  on lisinopril HCTZ, having some dry cough and throat clearing with ACEI. BP Readings from Last 3 Encounters:  04/03/16 122/66  03/23/16 140/89  02/10/16 140/80  Using medication without problems or lightheadedness: None  Chest pain with exertion:None  Edema:None  Short of breath:None  Average home BPs: 135-149/100 at home Other issues:   Elevated Cholesterol: Well controlled on lipitor 10 mg daily  Lab Results  Component Value Date   CHOL 177 03/27/2016   HDL 65.60 03/27/2016   LDLCALC 87 03/27/2016   TRIG 122.0 03/27/2016   CHOLHDL 3 03/27/2016  Using medications without problems:None  Muscle aches: None  Diet compliance: Good, working on low sodium diet.  Exercise:Walking  1 hour a day Other complaints:   Hx of Mild intermittant asthma: stable, last used inhaler 1-2 times a year Allergies stable on current medication 9 allegra)  Urinary incontinence, stress: Seen Dr. Consuella Lose Macdiarmid ... Improved with 8 weeks of PT. She has noted a lot of improvement in this issue following surgery.  She continues pelvic wall exercises. Using pad daily.  Social History /Family History/Past  Medical History reviewed and updated if needed.  Review of Systems  Constitutional: Negative for fever, fatigue and unexpected weight change.  HENT: Negative for ear pain, congestion, sore throat, sneezing, trouble swallowing and sinus pressure.  Eyes: Negative for pain and itching.  Respiratory: Negative for cough, shortness of breath and wheezing.  Cardiovascular: Negative for chest pain, palpitations and leg swelling.  Gastrointestinal: Negative for nausea, abdominal pain, diarrhea, has  Constipation treats with fiber, stool softner and miralax. and  No blood in stool.  Genitourinary: Negative for dysuria, hematuria, vaginal discharge and difficulty urinating.  Skin: Negative for rash.  Neurological: Negative for syncope, weakness, light-headedness, numbness and headaches.  Psychiatric/Behavioral: Negative for confusion and dysphoric mood. The patient is not nervous/anxious.  Objective:   Physical Exam  Constitutional: Vital signs are normal. She appears well-developed and well-nourished. She is cooperative. Non-toxic appearance. She does not appear ill. No distress.  HENT:  Head: Normocephalic.  Right Ear: Hearing, tympanic membrane, external ear and ear canal normal.  Left Ear: Hearing, tympanic membrane, external ear and ear canal normal.  Nose: Nose normal.  Eyes: Conjunctivae, EOM and lids are normal. Pupils are equal, round, and reactive to light. No foreign bodies found.  Neck: Trachea normal and normal range of motion. Neck supple. Carotid bruit is not present. No mass and no thyromegaly present.  Cardiovascular: Normal rate, regular rhythm, S1 normal, S2 normal, normal heart sounds and intact distal pulses. Exam reveals no gallop.  No murmur heard.  Pulmonary/Chest: Effort normal and breath sounds normal. No respiratory distress.  She has no wheezes. She has no rhonchi. She has no rales.  Abdominal: Soft. Normal appearance and bowel sounds are normal. She  exhibits no distension, no fluid wave, no abdominal bruit and no mass. There is no hepatosplenomegaly. There is no tenderness. There is no rebound, no guarding and no CVA tenderness. No hernia.  Healing scars from laproscopic surgery. GYN: breast exam normal bilaterally, pelvic performed at GYN. Lymphadenopathy:  She has no cervical adenopathy.  She has no axillary adenopathy.  Neurological: She is alert. She has normal strength. No cranial nerve deficit or sensory deficit.  Skin: Skin is warm, dry and intact. No rash noted.  Psychiatric: Her speech is normal and behavior is normal. Judgment normal. Her mood appears not anxious. Cognition and memory are normal. She does not exhibit a depressed mood.  Assessment & Plan:   The patient's preventative maintenance and recommended screening tests for an annual wellness exam were reviewed in full today.  Brought up to date unless services declined.  Counselled on the importance of diet, exercise, and its role in overall health and mortality.  The patient's FH and SH was reviewed, including their home life, tobacco status, and drug and alcohol status.   Vaccines:uptodate with Td,zoster, PNA, prevnar. Will get flu at work. Nonsmoker  Mammo: nml in 09/2015, repeat q 2 years DVE/PAP: Sees Dr. Rogelio Seen for GYN ONC . nml pap pelvic 12/2015 Colon:Colonoscopy  07/2015, hx of polyps, repeat in 5 years DEXA:12/2009 nml due for repeat in 5 years.  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 SE of constipation. Stopped  fosamaxafter surgery. .  Repeat  DXA 12/2015 NORMAL on no med. HCV neg in 2014.  nonsmoker

## 2016-04-06 DIAGNOSIS — H52223 Regular astigmatism, bilateral: Secondary | ICD-10-CM | POA: Diagnosis not present

## 2016-04-10 MED FILL — VENLAFAXINE HCL ER 75 MG CA: 75 | 30 days supply | Qty: 30 | Fill #8

## 2016-04-13 ENCOUNTER — Ambulatory Visit: Payer: 59

## 2016-04-17 ENCOUNTER — Encounter: Payer: Self-pay | Admitting: Family Medicine

## 2016-04-17 ENCOUNTER — Other Ambulatory Visit (INDEPENDENT_AMBULATORY_CARE_PROVIDER_SITE_OTHER): Payer: 59

## 2016-04-17 DIAGNOSIS — I10 Essential (primary) hypertension: Secondary | ICD-10-CM

## 2016-04-17 LAB — BASIC METABOLIC PANEL
BUN: 17 mg/dL (ref 6–23)
CO2: 35 mEq/L — ABNORMAL HIGH (ref 19–32)
Calcium: 9.5 mg/dL (ref 8.4–10.5)
Chloride: 101 mEq/L (ref 96–112)
Creatinine, Ser: 0.7 mg/dL (ref 0.40–1.20)
GFR: 88.87 mL/min (ref >60.00)
Glucose, Bld: 72 mg/dL (ref 70–99)
Potassium: 4 mEq/L (ref 3.5–5.1)
Sodium: 140 mEq/L (ref 135–145)

## 2016-04-24 DIAGNOSIS — M25552 Pain in left hip: Secondary | ICD-10-CM | POA: Diagnosis not present

## 2016-04-24 DIAGNOSIS — T84090D Other mechanical complication of internal right hip prosthesis, subsequent encounter: Secondary | ICD-10-CM | POA: Diagnosis not present

## 2016-04-26 MED FILL — PREMARIN 0.3 MG TABLET: 0.3 | 28 days supply | Qty: 21 | Fill #8

## 2016-05-01 MED FILL — LOSARTAN-HCTZ 100-25 MG TAB: 100-25 | 30 days supply | Qty: 30 | Fill #1

## 2016-05-03 ENCOUNTER — Encounter: Payer: Self-pay | Admitting: Family Medicine

## 2016-05-08 MED FILL — VENLAFAXINE HCL ER 75 MG CA: 75 | 30 days supply | Qty: 30 | Fill #9

## 2016-05-24 MED FILL — PREMARIN 0.3 MG TABLET: 0.3 | 28 days supply | Qty: 21 | Fill #9

## 2016-05-30 MED FILL — LOSARTAN-HCTZ 100-25 MG TAB: 100-25 | 30 days supply | Qty: 30 | Fill #2

## 2016-06-11 MED FILL — VENLAFAXINE HCL ER 75 MG CA: 75 | 30 days supply | Qty: 30 | Fill #10

## 2016-06-19 ENCOUNTER — Other Ambulatory Visit: Payer: Self-pay | Admitting: Gynecologic Oncology

## 2016-06-19 ENCOUNTER — Ambulatory Visit: Payer: 59 | Attending: Gynecologic Oncology | Admitting: Gynecologic Oncology

## 2016-06-19 ENCOUNTER — Encounter: Payer: Self-pay | Admitting: Gynecologic Oncology

## 2016-06-19 VITALS — BP 147/80 | HR 84 | Temp 98.1°F | Resp 18 | Ht 67.5 in | Wt 164.0 lb

## 2016-06-19 DIAGNOSIS — Z79899 Other long term (current) drug therapy: Secondary | ICD-10-CM | POA: Insufficient documentation

## 2016-06-19 DIAGNOSIS — C541 Malignant neoplasm of endometrium: Secondary | ICD-10-CM | POA: Diagnosis not present

## 2016-06-19 DIAGNOSIS — N951 Menopausal and female climacteric states: Secondary | ICD-10-CM

## 2016-06-19 DIAGNOSIS — Z9889 Other specified postprocedural states: Secondary | ICD-10-CM | POA: Diagnosis not present

## 2016-06-19 MED ORDER — ESTROGENS CONJUGATED 0.3 MG PO TABS: 0.3000 mg | ORAL_TABLET | Freq: Every day | ORAL | 4 refills | Status: DC

## 2016-06-19 MED FILL — PREMARIN 0.3 MG TABLET: 0.3 | 84 days supply | Qty: 63 | Fill #0

## 2016-06-19 NOTE — Patient Instructions (Signed)
Follow-up with Dr. Neal in 6 months and return to see us in one year. 

## 2016-06-19 NOTE — Progress Notes (Signed)
Consult Note: Gyn-Onc  Christina Griffith 65 y.o. female  CC:  Chief Complaint  Patient presents with  . endometrial cancer    Follow up    HPI: Christina Griffith is a 65 y.o. year old initially seen in consultation for grade 1 endometrial cancer She then underwent a RTLH BSO BPLND on 0000000 without complications. Her final pathologic diagnosis is a Stage 1A Grade 1 endometrioid endometrial cancer with no lymphovascular space invasion, no myometrial invasion and negative lymph nodes.  Interval History:  I last saw her in 11/16. In the interim she saw Dr. Nori Griffith and had a negative exam and negative Pap smear in April.  She had a colonoscopy December 2016 with recommendations for 5 year follow-up.  She's overall doing fairly well. She continues to have some night sweats at night will have 3-4 hot flashes during the day. She did try to wean herself off her estrogen replacement of the summer but was not able to [place] she believes her quality of life suffers. She would be willing to try in the color months. With her night sweats she states that there less severe and she is able to fall back asleep easily. She is complaining of some upper back and right shoulder discomfort which is positional. She believes is secondary to sitting and working at a computer and using a computer mouse all day. She uses ibuprofen every 6 hours for relief of her symptoms but his had to use tramadol at night to help her fall sleep. There are no new medical problems and her family. She is up-to-date on her mammograms.  Review of Systems  Constitutional: Denies fever. Continued vasomotor symptoms. Skin: No rash Cardiovascular: No chest pain, shortness of breath, or edema  Pulmonary: No cough Gastro Intestinal:  No nausea, vomiting, mild constipation, or diarrhea reported. No bright red blood per rectum or change in bowel movement.  Genitourinary: No frequency, urgency, or dysuria.  Denies vaginal bleeding and discharge.   Musculoskeletal: Right upper back and shoulder discomfort as above Psychology: No complaints  Current Meds:  Outpatient Encounter Prescriptions as of 06/19/2016  Medication Sig Dispense Refill  . atorvastatin (LIPITOR) 10 MG tablet TAKE 1 TABLET BY MOUTH ONCE DAILY 90 tablet 3  . Calcium Carb-Cholecalciferol (CALCIUM 600+D) 600-800 MG-UNIT TABS Take 1 tablet by mouth daily.    Marland Kitchen ibuprofen (ADVIL,MOTRIN) 200 MG tablet Take 600 mg by mouth every 8 (eight) hours as needed.    . Lactobacillus Rhamnosus, GG, (CULTURELLE) CAPS Take 1 capsule by mouth daily.    Marland Kitchen losartan-hydrochlorothiazide (HYZAAR) 100-25 MG tablet Take 1 tablet by mouth daily. 30 tablet 3  . magnesium gluconate (MAGONATE) 500 MG tablet Take 500 mg by mouth daily.    . Multiple Vitamin (MULTIVITAMIN) tablet Take 1 tablet by mouth daily.    . Polyethylene Glycol 3350 (MIRALAX PO) Take 17 g by mouth as needed.     Marland Kitchen PREMARIN 0.3 MG tablet TAKE 1 TABLET BY MOUTH ONCE DAILY FOR 21 DAYS, THEN DO NOT TAKE FOR 7 DAYS. 30 tablet 12  . Triamcinolone Acetonide (NASACORT ALLERGY 24HR NA) Place 1 spray into the nose as needed.     . venlafaxine XR (EFFEXOR-XR) 75 MG 24 hr capsule Take 1 capsule by mouth at bedtime.    Marland Kitchen albuterol (PROVENTIL HFA;VENTOLIN HFA) 108 (90 BASE) MCG/ACT inhaler Inhale 2 puffs into the lungs every 6 (six) hours as needed for wheezing or shortness of breath. (Patient not taking: Reported on 06/19/2016) 1 Inhaler  2  . fexofenadine (ALLEGRA) 180 MG tablet Take 180 mg by mouth daily as needed for allergies or rhinitis.     No facility-administered encounter medications on file as of 06/19/2016.     Allergy: No Known Allergies  Social Hx:   Social History   Social History  . Marital status: Single    Spouse name: N/A  . Number of children: N/A  . 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 Smoking status: Never Smoker  . Smokeless  tobacco: Never Used  . Alcohol use Yes     Comment: 3 x a week  . Drug use: No  . Sexual activity: Not on file   Other Topics Concern  . Not on file   Social History Narrative   Regular exercise- yes, 5-6 days a week, yoga   Diet: healthy    Past Surgical Hx:  Past Surgical History:  Procedure Laterality Date  . BREAST SURGERY Right 1980's   breast biopsy  . COLONOSCOPY    . EYE SURGERY Bilateral 02/2015   cataracts  . HYSTEROSCOPY W/D&C Left 1/24/204   w/ resection of Endometrial polyps  . ROBOTIC ASSISTED TOTAL HYSTERECTOMY WITH BILATERAL SALPINGO OOPHERECTOMY N/A 10/07/2012   Procedure: ROBOTIC ASSISTED TOTAL HYSTERECTOMY WITH BILATERAL SALPINGO OOPHORECTOMY/POSSIBLE LYMPH NODE BIOPSY;  Surgeon: Christina Gurney A. Alycia Rossetti, MD;  Location: [facility];  Service: Gynecology;  Laterality: N/A;  . TOTAL HIP ARTHROPLASTY     right 2002 left 2004  . TOTAL HIP REVISION Right 05/09/2015   Procedure: TOTAL HIP REVISION;  Surgeon: Christina Pear, MD;  Location: [place];  Service: Orthopedics;  Laterality: Right;    Past Medical Hx:  Past Medical History:  Diagnosis Date  . Arthritis    spinal stenosis--back pain--hx of herniated cervical disk- but no surgery  . Asthma   . Cold 10/03/12   pt getting over a cold--still has some head congestion, slight cough  . Colon polyps   . Endometrial ca ([place])   . H/O: hematuria    benign essential hematuria - per urology work up   . History of depression   . History of pneumonia   . Hypertension   . Numbness in right leg    pt. states occassional in right leg and right big toe from disc herniations  . PONV (postoperative nausea and vomiting)    with prior hip replacement  . Stress incontinence   . Wears glasses     Oncology Hx:    Endometrial cancer ([place])   10/07/2012 Surgery    RTH/BSO adn BPLND. IA grade 1       Family Hx:  Family History  Problem Relation Age of Onset  . Hypertension Mother   . Cancer Father     bladder and prostate  .  Hyperlipidemia Father   . Cancer Maternal Grandmother     colon  . Breast cancer Cousin     Vitals:  Blood pressure (!) 147/80, pulse 84, temperature 98.1 F (36.7 C), temperature source Oral, resp. rate 18, height 5' 7.5" (1.715 m), weight 164 lb (74.4 kg), SpO2 100 %.  Physical Exam: Well-nourished well-developed female in no acute distress.  Neck: Supple, no lymphadenopathy no thyromegaly.   Lungs: Clear to auscultation.  Cardiovascular: Regular rate and rhythm.  Abdomen: Well-healed incisions. Abdomen is soft, nontender, nondistended. There are no palpable masses or hepatosplenomegaly.  Extremities: No edema.  Groins: No  lymphadenopathy.  Pelvic: Normal external female genitalia. Vagina slightly atrophic. There are no visible lesions. Bimanual examination reveals no masses or nodularity. Rectal confirms  Assessment/Plan: 65 y.o. year old with Stage IA Grade 1 endometrioid endometrial cancer.   Plan:  1) She will continue her Effexor and I refilled her estrogen and she can slowly start weaning herself off. 3) She'll follow-up with her physicians as scheduled.  4) She will follow up with Dr. Nori Griffith in 6 months and return to see [place] in one year.  Capone Schwinn A., MD 06/19/2016, 1:17 PM

## 2016-07-02 MED FILL — ATORVASTATIN 10 MG TABLET: 10 | 90 days supply | Qty: 90 | Fill #1

## 2016-07-02 MED FILL — LOSARTAN-HCTZ 100-25 MG TAB: 100-25 | 30 days supply | Qty: 30 | Fill #3

## 2016-07-10 MED FILL — VENLAFAXINE HCL ER 75 MG CA: 75 | 30 days supply | Qty: 30 | Fill #11

## 2016-07-25 ENCOUNTER — Other Ambulatory Visit: Payer: Self-pay | Admitting: Family Medicine

## 2016-07-25 MED FILL — LOSARTAN-HCTZ 100-25 MG TAB: 100-25 | 90 days supply | Qty: 90 | Fill #0

## 2016-08-08 MED FILL — VENLAFAXINE HCL ER 75 MG CA: 75 | 30 days supply | Qty: 30 | Fill #12

## 2016-08-24 MED FILL — NAPROXEN 500 MG TABLET: 500 | 4 days supply | Qty: 12 | Fill #0

## 2016-08-24 MED FILL — PENICILLIN VK 500 MG TABLET: 500 | 7 days supply | Qty: 28 | Fill #0

## 2016-09-10 ENCOUNTER — Other Ambulatory Visit: Payer: Self-pay | Admitting: Gynecologic Oncology

## 2016-09-10 MED FILL — VENLAFAXINE HCL ER 75 MG CA: 75 | 30 days supply | Qty: 30 | Fill #0

## 2016-09-18 DIAGNOSIS — Z1231 Encounter for screening mammogram for malignant neoplasm of breast: Secondary | ICD-10-CM | POA: Diagnosis not present

## 2016-10-03 MED FILL — ATORVASTATIN 10 MG TABLET: 10 | 90 days supply | Qty: 90 | Fill #2

## 2016-10-05 MED FILL — VENLAFAXINE HCL ER 75 MG CA: 75 | 30 days supply | Qty: 30 | Fill #1

## 2016-10-25 MED FILL — LOSARTAN-HCTZ 100-25 MG TAB: 100-25 | 90 days supply | Qty: 90 | Fill #1

## 2016-11-07 MED FILL — VENLAFAXINE HCL ER 75 MG CA: 75 | 30 days supply | Qty: 30 | Fill #2

## 2016-11-20 ENCOUNTER — Encounter: Payer: Self-pay | Admitting: Family Medicine

## 2016-11-20 ENCOUNTER — Ambulatory Visit (INDEPENDENT_AMBULATORY_CARE_PROVIDER_SITE_OTHER): Payer: 59 | Admitting: Family Medicine

## 2016-11-20 DIAGNOSIS — J45909 Unspecified asthma, uncomplicated: Secondary | ICD-10-CM | POA: Diagnosis not present

## 2016-11-20 DIAGNOSIS — J209 Acute bronchitis, unspecified: Secondary | ICD-10-CM

## 2016-11-20 MED ORDER — PREDNISONE 20 MG PO TABS: ORAL_TABLET | ORAL | 0 refills | Status: DC

## 2016-11-20 MED ORDER — ALBUTEROL SULFATE (2.5 MG/3ML) 0.083% IN NEBU
2.5000 mg | INHALATION_SOLUTION | Freq: Once | RESPIRATORY_TRACT | Status: AC
  Administered 2016-11-20: 2.5 mg via RESPIRATORY_TRACT

## 2016-11-20 MED ORDER — DEXAMETHASONE SODIUM PHOSPHATE 10 MG/ML IJ SOLN
10.0000 mg | Freq: Once | INTRAMUSCULAR | Status: AC
  Administered 2016-11-20: 10 mg via INTRAMUSCULAR

## 2016-11-20 MED ORDER — IPRATROPIUM BROMIDE 0.02 % IN SOLN
0.5000 mg | Freq: Once | RESPIRATORY_TRACT | Status: AC
  Administered 2016-11-20: 0.5 mg via RESPIRATORY_TRACT

## 2016-11-20 MED ORDER — DOXYCYCLINE HYCLATE 100 MG PO TABS: 100.0000 mg | ORAL_TABLET | Freq: Two times a day (BID) | ORAL | 0 refills | Status: DC

## 2016-11-20 MED ORDER — ALBUTEROL SULFATE HFA 108 (90 BASE) MCG/ACT IN AERS: 2.0000 | INHALATION_SPRAY | Freq: Four times a day (QID) | RESPIRATORY_TRACT | 2 refills | Status: DC | PRN

## 2016-11-20 MED FILL — predniSONE 20 MG TABS: 20 | 10 days supply | Qty: 18 | Fill #0

## 2016-11-20 MED FILL — VENTOLIN HFA 90 MCG INHALER: 108 (90 BAS | 25 days supply | Qty: 18 | Fill #0

## 2016-11-20 MED FILL — DOXYCYCLINE HYCLATE 100 MG: 100 | 7 days supply | Qty: 14 | Fill #0

## 2016-11-20 NOTE — Progress Notes (Signed)
SUBJECTIVE:  Christina Griffith is a 66 y.o. female pt of Dr. Diona Browner, new to me, who complains of coryza, congestion, sore throat, dry cough, myalgias, headache and shortness of breath for 10 days. She denies a history of chest pain, chills and vomiting and admits to a history of asthma. Patient denies smoke cigarettes.  Has been using her albuterol inhaler regularly and still feels SOB.  Has also tried OTC sudafed, allegra and robitussin without much relief.  Current Outpatient Prescriptions on File Prior to Visit  Medication Sig Dispense Refill  . albuterol (PROVENTIL HFA;VENTOLIN HFA) 108 (90 BASE) MCG/ACT inhaler Inhale 2 puffs into the lungs every 6 (six) hours as needed for wheezing or shortness of breath. 1 Inhaler 2  . atorvastatin (LIPITOR) 10 MG tablet TAKE 1 TABLET BY MOUTH ONCE DAILY 90 tablet 3  . Calcium Carb-Cholecalciferol (CALCIUM 600+D) 600-800 MG-UNIT TABS Take 1 tablet by mouth daily.    . fexofenadine (ALLEGRA) 180 MG tablet Take 180 mg by mouth daily as needed for allergies or rhinitis.    Marland Kitchen ibuprofen (ADVIL,MOTRIN) 200 MG tablet Take 600 mg by mouth every 8 (eight) hours as needed.    . Lactobacillus Rhamnosus, GG, (CULTURELLE) CAPS Take 1 capsule by mouth daily.    Marland Kitchen losartan-hydrochlorothiazide (HYZAAR) 100-25 MG tablet TAKE 1 TABLET BY MOUTH DAILY. 90 tablet 1  . magnesium gluconate (MAGONATE) 500 MG tablet Take 500 mg by mouth daily.    . Multiple Vitamin (MULTIVITAMIN) tablet Take 1 tablet by mouth daily.    . Polyethylene Glycol 3350 (MIRALAX PO) Take 17 g by mouth as needed.     . Triamcinolone Acetonide (NASACORT ALLERGY 24HR NA) Place 1 spray into the nose as needed.     . venlafaxine XR (EFFEXOR-XR) 75 MG 24 hr capsule TAKE 1 CAPSULE BY MOUTH AT BEDTIME 30 capsule 12   No current facility-administered medications on file prior to visit.     No Known Allergies  Past Medical History:  Diagnosis Date  . Arthritis    spinal stenosis--back pain--hx of  herniated cervical disk- but no surgery  . Asthma   . Cold 10/03/12   pt getting over a cold--still has some head congestion, slight cough  . Colon polyps   . Endometrial ca ([place])   . H/O: hematuria    benign essential hematuria - per urology work up   . History of depression   . History of pneumonia   . Hypertension   . Numbness in right leg    pt. states occassional in right leg and right big toe from disc herniations  . PONV (postoperative nausea and vomiting)    with prior hip replacement  . Stress incontinence   . Wears glasses     Past Surgical History:  Procedure Laterality Date  . BREAST SURGERY Right 1980's   breast biopsy  . COLONOSCOPY    . EYE SURGERY Bilateral 02/2015   cataracts  . HYSTEROSCOPY W/D&C Left 1/24/204   w/ resection of Endometrial polyps  . ROBOTIC ASSISTED TOTAL HYSTERECTOMY WITH BILATERAL SALPINGO OOPHERECTOMY N/A 10/07/2012   Procedure: ROBOTIC ASSISTED TOTAL HYSTERECTOMY WITH BILATERAL SALPINGO OOPHORECTOMY/POSSIBLE LYMPH NODE BIOPSY;  Surgeon: Imagene Gurney A. Alycia Rossetti, MD;  Location: [facility];  Service: Gynecology;  Laterality: N/A;  . TOTAL HIP ARTHROPLASTY     right 2002 left 2004  . TOTAL HIP REVISION Right 05/09/2015   Procedure: TOTAL HIP REVISION;  Surgeon: Frederik Pear, MD;  Location: [place];  Service: Orthopedics;  Laterality: Right;  Family History  Problem Relation Age of Onset  . Hypertension Mother   . Cancer Father     bladder and prostate  . Hyperlipidemia Father   . Cancer Maternal Grandmother     colon  . Breast cancer Cousin     Social History   Social History  . Marital status: Single    Spouse name: N/A  . Number of children: N/A  . 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 Smoking status: Never Smoker  . Smokeless tobacco: Never Used  . Alcohol use Yes     Comment: 3 x a week  . Drug use: No  . Sexual activity: Not on file   Other Topics  Concern  . Not on file   Social History Narrative   Regular exercise- yes, 5-6 days a week, yoga   Diet: healthy   The PMH, PSH, Social History, Family History, Medications, and allergies have been reviewed in [facility], and have been updated if relevant.   OBJECTIVE: BP 132/88 (BP Location: Left Arm, Patient Position: Sitting, Cuff Size: Large)   Pulse (!) 102   Temp 97.9 F (36.6 C) (Oral)   Resp (!) 30   Wt 163 lb (73.9 kg)   SpO2 99%   BMI 25.15 kg/m   She appears well, vital signs are as noted. Ears normal.  Throat and pharynx normal.  Neck supple. No adenopathy in the neck. Nose is congested. Sinuses non tender. Chest is tight, scattered wheezes, left lower lobe rhonchi

## 2016-11-20 NOTE — Addendum Note (Signed)
Addended by: Lucille Passy on: 11/20/2016 10:25 AM   Modules accepted: Orders

## 2016-11-20 NOTE — Progress Notes (Signed)
Pre visit review using our clinic review tool, if applicable. No additional management support is needed unless otherwise documented below in the visit note. 

## 2016-11-20 NOTE — Patient Instructions (Signed)
Great to see you. Please take doxycycline and prednisone as directed (would wait to start prednisone tomorrow). Please keep [place] updated.

## 2016-11-20 NOTE — Addendum Note (Signed)
Addended by: Pilar Grammes on: 11/20/2016 11:16 AM   Modules accepted: Orders

## 2016-12-05 MED FILL — VENLAFAXINE HCL ER 75 MG CA: 75 | 30 days supply | Qty: 30 | Fill #3

## 2016-12-11 DIAGNOSIS — Z6825 Body mass index (BMI) 25.0-25.9, adult: Secondary | ICD-10-CM | POA: Diagnosis not present

## 2016-12-11 DIAGNOSIS — Z01419 Encounter for gynecological examination (general) (routine) without abnormal findings: Secondary | ICD-10-CM | POA: Diagnosis not present

## 2017-01-01 MED FILL — ATORVASTATIN 10 MG TABLET: 10 | 90 days supply | Qty: 90 | Fill #3

## 2017-01-09 MED FILL — VENLAFAXINE HCL ER 75 MG CA: 75 | 30 days supply | Qty: 30 | Fill #4

## 2017-01-23 ENCOUNTER — Other Ambulatory Visit: Payer: Self-pay | Admitting: Family Medicine

## 2017-01-23 MED FILL — LOSARTAN-HCTZ 100-25 MG TAB: 100-25 | 90 days supply | Qty: 90 | Fill #0

## 2017-02-07 MED FILL — VENLAFAXINE HCL ER 75 MG CA: 75 | 30 days supply | Qty: 30 | Fill #5

## 2017-03-08 MED FILL — VENLAFAXINE HCL ER 75 MG CA: 75 | 30 days supply | Qty: 30 | Fill #6

## 2017-03-26 ENCOUNTER — Telehealth: Payer: Self-pay | Admitting: *Deleted

## 2017-03-26 NOTE — Telephone Encounter (Signed)
Returned the patients call and scheduled a follow up appt for November 28th.

## 2017-04-02 MED FILL — AMOXICILLIN 500 MG CAPSULE: 500 | 7 days supply | Qty: 28 | Fill #0

## 2017-04-02 MED FILL — NAPROXEN 500 MG TABLET: 500 | 4 days supply | Qty: 12 | Fill #0

## 2017-04-03 ENCOUNTER — Other Ambulatory Visit: Payer: Self-pay | Admitting: Family Medicine

## 2017-04-03 MED FILL — ATORVASTATIN 10 MG TABLET: 10 | 90 days supply | Qty: 90 | Fill #0

## 2017-04-05 ENCOUNTER — Ambulatory Visit (INDEPENDENT_AMBULATORY_CARE_PROVIDER_SITE_OTHER): Payer: 59

## 2017-04-05 VITALS — BP 138/82 | HR 88 | Temp 98.2°F | Ht 67.5 in | Wt 166.0 lb

## 2017-04-05 DIAGNOSIS — Z0000 Encounter for general adult medical examination without abnormal findings: Secondary | ICD-10-CM | POA: Diagnosis not present

## 2017-04-05 DIAGNOSIS — I10 Essential (primary) hypertension: Secondary | ICD-10-CM

## 2017-04-05 DIAGNOSIS — E7800 Pure hypercholesterolemia, unspecified: Secondary | ICD-10-CM | POA: Diagnosis not present

## 2017-04-05 DIAGNOSIS — Z23 Encounter for immunization: Secondary | ICD-10-CM | POA: Diagnosis not present

## 2017-04-05 LAB — LIPID PANEL
Cholesterol: 191 mg/dL (ref 0–200)
HDL: 60.7 mg/dL (ref >39.00)
LDL Cholesterol: 104 mg/dL — ABNORMAL HIGH (ref 0–99)
NonHDL: 130.63
Total CHOL/HDL Ratio: 3
Triglycerides: 134 mg/dL (ref 0.0–149.0)
VLDL: 26.8 mg/dL (ref 0.0–40.0)

## 2017-04-05 LAB — COMPREHENSIVE METABOLIC PANEL
ALT: 38 U/L — ABNORMAL HIGH (ref 0–35)
AST: 31 U/L (ref 0–37)
Albumin: 4.3 g/dL (ref 3.5–5.2)
Alkaline Phosphatase: 63 U/L (ref 39–117)
BUN: 21 mg/dL (ref 6–23)
CO2: 29 mEq/L (ref 19–32)
Calcium: 9.9 mg/dL (ref 8.4–10.5)
Chloride: 100 mEq/L (ref 96–112)
Creatinine, Ser: 0.78 mg/dL (ref 0.40–1.20)
GFR: 78.21 mL/min (ref >60.00)
Glucose, Bld: 92 mg/dL (ref 70–99)
Potassium: 4 mEq/L (ref 3.5–5.1)
Sodium: 137 mEq/L (ref 135–145)
Total Bilirubin: 0.5 mg/dL (ref 0.2–1.2)
Total Protein: 6.9 g/dL (ref 6.0–8.3)

## 2017-04-05 LAB — CBC WITH DIFFERENTIAL/PLATELET
Basophils Absolute: 0 10*3/uL (ref 0.0–0.1)
Basophils Relative: 0.6 % (ref 0.0–3.0)
Eosinophils Absolute: 0.2 10*3/uL (ref 0.0–0.7)
Eosinophils Relative: 3.6 % (ref 0.0–5.0)
HCT: 40.5 % (ref 36.0–46.0)
Hemoglobin: 13.5 g/dL (ref 12.0–15.0)
Lymphocytes Relative: 33.3 % (ref 12.0–46.0)
Lymphs Abs: 2.2 10*3/uL (ref 0.7–4.0)
MCHC: 33.3 g/dL (ref 30.0–36.0)
MCV: 94 fl (ref 78.0–100.0)
Monocytes Absolute: 0.6 10*3/uL (ref 0.1–1.0)
Monocytes Relative: 9.2 % (ref 3.0–12.0)
Neutro Abs: 3.6 10*3/uL (ref 1.4–7.7)
Neutrophils Relative %: 53.3 % (ref 43.0–77.0)
Platelets: 253 10*3/uL (ref 150.0–400.0)
RBC: 4.31 Mil/uL (ref 3.87–5.11)
RDW: 13 % (ref 11.5–15.5)
WBC: 6.7 10*3/uL (ref 4.0–10.5)

## 2017-04-05 NOTE — Patient Instructions (Signed)
Christina Griffith , Thank you for taking time to come for your Medicare Wellness Visit. I appreciate your ongoing commitment to your health goals. Please review the following plan we discussed and let me know if I can assist you in the future.   These are the goals we discussed: Goals    . Increase physical activity          Starting 04/05/2017, I will continue to walk at least 60 min daily and to do core exercises for 10 min 3 days per week.        This is a list of the screening recommended for you and due dates:  Health Maintenance  Topic Date Due  . Flu Shot  11/03/2017*  . Tetanus Vaccine  08/28/2017  . Mammogram  09/18/2017  . Colon Cancer Screening  08/04/2025  . DEXA scan (bone density measurement)  Completed  .  Hepatitis C: One time screening is recommended by Center for Disease Control  (CDC) for  adults born from [birth year] through 1966.   Completed  . Pneumonia vaccines  Completed  *Topic was postponed. The date shown is not the original due date.   Preventive Care for Adults  A healthy lifestyle and preventive care can promote health and wellness. Preventive health guidelines for adults include the following key practices.  . A routine yearly physical is a good way to check with your health care provider about your health and preventive screening. It is a chance to share any concerns and updates on your health and to receive a thorough exam.  . Visit your dentist for a routine exam and preventive care every 6 months. Brush your teeth twice a day and floss once a day. Good oral hygiene prevents tooth decay and gum disease.  . The frequency of eye exams is based on your age, health, family medical history, use  of contact lenses, and other factors. Follow your health care provider's ecommendations for frequency of eye exams.  . Eat a healthy diet. Foods like vegetables, fruits, whole grains, low-fat dairy products, and lean protein foods contain the nutrients you need without too  many calories. Decrease your intake of foods high in solid fats, added sugars, and salt. Eat the right amount of calories for you. Get information about a proper diet from your health care provider, if necessary.  . Regular physical exercise is one of the most important things you can do for your health. Most adults should get at least 150 minutes of moderate-intensity exercise (any activity that increases your heart rate and causes you to sweat) each week. In addition, most adults need muscle-strengthening exercises on 2 or more days a week.  Silver Sneakers may be a benefit available to you. To determine eligibility, you may visit the website: www.silversneakers.com or contact program at 762-877-3454 Mon-Fri between 8AM-8PM.   . Maintain a healthy weight. The body mass index (BMI) is a screening tool to identify possible weight problems. It provides an estimate of body fat based on height and weight. Your health care provider can find your BMI and can help you achieve or maintain a healthy weight.   For adults 20 years and older: ? A BMI below 18.5 is considered underweight. ? A BMI of 18.5 to 24.9 is normal. ? A BMI of 25 to 29.9 is considered overweight. ? A BMI of 30 and above is considered obese.   . Maintain normal blood lipids and cholesterol levels by exercising and minimizing your intake of saturated  fat. Eat a balanced diet with plenty of fruit and vegetables. Blood tests for lipids and cholesterol should begin at age 33 and be repeated every 5 years. If your lipid or cholesterol levels are high, you are over 50, or you are at high risk for heart disease, you may need your cholesterol levels checked more frequently. Ongoing high lipid and cholesterol levels should be treated with medicines if diet and exercise are not working.  . If you smoke, find out from your health care provider how to quit. If you do not use tobacco, please do not start.  . If you choose to drink alcohol, please  do not consume more than 2 drinks per day. One drink is considered to be 12 ounces (355 mL) of beer, 5 ounces (148 mL) of wine, or 1.5 ounces (44 mL) of liquor.  . If you are 1-47 years old, ask your health care provider if you should take aspirin to prevent strokes.  . Use sunscreen. Apply sunscreen liberally and repeatedly throughout the day. You should seek shade when your shadow is shorter than you. Protect yourself by wearing long sleeves, pants, a wide-brimmed hat, and sunglasses year round, whenever you are outdoors.  . Once a month, do a whole body skin exam, using a mirror to look at the skin on your back. Tell your health care provider of new moles, moles that have irregular borders, moles that are larger than a pencil eraser, or moles that have changed in shape or color.

## 2017-04-05 NOTE — Progress Notes (Signed)
Pre visit review using our clinic review tool, if applicable. No additional management support is needed unless otherwise documented below in the visit note. 

## 2017-04-05 NOTE — Progress Notes (Signed)
PCP notes:   Health maintenance:  Flu vaccine - addressed PPSV23 - administered  Abnormal screenings:   Hearing - failed  Patient concerns:   None  Nurse concerns:  None  Next PCP appt:   04/12/17 @ 1000

## 2017-04-05 NOTE — Progress Notes (Signed)
I reviewed health advisor's note, was available for consultation, and agree with documentation and plan.  

## 2017-04-05 NOTE — Progress Notes (Signed)
Subjective:   Christina Griffith is a 66 y.o. female who presents for Medicare Annual (Subsequent) preventive examination.  Review of Systems:  N/A Cardiac Risk Factors include: advanced age (>18men, >2 women);dyslipidemia;hypertension     Objective:     Vitals: BP 138/82 (BP Location: Right Arm, Patient Position: Sitting, Cuff Size: Normal)   Pulse 88   Temp 98.2 F (36.8 C) (Oral)   Ht 5' 7.5" (1.715 m) Comment: no shoes  Wt 166 lb (75.3 kg)   SpO2 98%   BMI 25.62 kg/m   Body mass index is 25.62 kg/m.   Tobacco History  Smoking Status  . Never Smoker  Smokeless Tobacco  . Never Used     Counseling given: No   Past Medical History:  Diagnosis Date  . Arthritis    spinal stenosis--back pain--hx of herniated cervical disk- but no surgery  . Asthma   . Cold 10/03/12   pt getting over a cold--still has some head congestion, slight cough  . Colon polyps   . Endometrial ca ([place])   . H/O: hematuria    benign essential hematuria - per urology work up   . History of depression   . History of pneumonia   . Hypertension   . Numbness in right leg    pt. states occassional in right leg and right big toe from disc herniations  . PONV (postoperative nausea and vomiting)    with prior hip replacement  . Stress incontinence   . Wears glasses    Past Surgical History:  Procedure Laterality Date  . BREAST SURGERY Right 1980's   breast biopsy  . COLONOSCOPY    . EYE SURGERY Bilateral 02/2015   cataracts  . HYSTEROSCOPY W/D&C Left 1/24/204   w/ resection of Endometrial polyps  . ROBOTIC ASSISTED TOTAL HYSTERECTOMY WITH BILATERAL SALPINGO OOPHERECTOMY N/A 10/07/2012   Procedure: ROBOTIC ASSISTED TOTAL HYSTERECTOMY WITH BILATERAL SALPINGO OOPHORECTOMY/POSSIBLE LYMPH NODE BIOPSY;  Surgeon: Imagene Gurney A. Alycia Rossetti, MD;  Location: [facility];  Service: Gynecology;  Laterality: N/A;  . TOTAL HIP ARTHROPLASTY     right 2002 left 2004  . TOTAL HIP REVISION Right 05/09/2015   Procedure:  TOTAL HIP REVISION;  Surgeon: Frederik Pear, MD;  Location: [place];  Service: Orthopedics;  Laterality: Right;   Family History  Problem Relation Age of Onset  . Hypertension Mother   . Cancer Father        bladder and prostate  . Hyperlipidemia Father   . Cancer Maternal Grandmother        colon  . Breast cancer Cousin    History  Sexual Activity  . Sexual activity: Not on file    Outpatient Encounter Prescriptions as of 04/05/2017  Medication Sig  . albuterol (PROVENTIL HFA;VENTOLIN HFA) 108 (90 Base) MCG/ACT inhaler Inhale 2 puffs into the lungs every 6 (six) hours as needed for wheezing or shortness of breath.  Marland Kitchen amoxicillin (AMOXIL) 500 MG capsule Take 500 mg by mouth every 6 (six) hours.  Marland Kitchen atorvastatin (LIPITOR) 10 MG tablet TAKE 1 TABLET BY MOUTH ONCE DAILY  . Calcium Carb-Cholecalciferol (CALCIUM 600+D) 600-800 MG-UNIT TABS Take 1 tablet by mouth daily.  . fexofenadine (ALLEGRA) 180 MG tablet Take 180 mg by mouth daily as needed for allergies or rhinitis.  Marland Kitchen ibuprofen (ADVIL,MOTRIN) 200 MG tablet Take 600 mg by mouth every 8 (eight) hours as needed.  . Lactobacillus Rhamnosus, GG, (CULTURELLE) CAPS Take 1 capsule by mouth daily.  Marland Kitchen losartan-hydrochlorothiazide (HYZAAR) 100-25 MG tablet  TAKE 1 TABLET BY MOUTH DAILY.  . magnesium gluconate (MAGONATE) 500 MG tablet Take 500 mg by mouth daily.  . Multiple Vitamin (MULTIVITAMIN) tablet Take 1 tablet by mouth daily.  . naproxen (NAPROSYN) 500 MG tablet Take 500 mg by mouth 3 (three) times daily with meals.  . Polyethylene Glycol 3350 (MIRALAX PO) Take 17 g by mouth as needed.   . Triamcinolone Acetonide (NASACORT ALLERGY 24HR NA) Place 1 spray into the nose as needed.   . venlafaxine XR (EFFEXOR-XR) 75 MG 24 hr capsule TAKE 1 CAPSULE BY MOUTH AT BEDTIME  . [DISCONTINUED] doxycycline (VIBRA-TABS) 100 MG tablet Take 1 tablet (100 mg total) by mouth 2 (two) times daily.  . [DISCONTINUED] predniSONE (DELTASONE) 20 MG tablet 3 tabs by  mouth x 2 days, 2 tabs by mouth daily x 3 days, 1 tab by mouth x 3 days, 1/2 tab by mouth x 2 days   No facility-administered encounter medications on file as of 04/05/2017.     Activities of Daily Living In your present state of health, do you have any difficulty performing the following activities: 04/05/2017  Hearing? N  Vision? N  Difficulty concentrating or making decisions? N  Walking or climbing stairs? N  Dressing or bathing? N  Doing errands, shopping? N  Preparing Food and eating ? N  Using the Toilet? N  In the past six months, have you accidently leaked urine? Y  Do you have problems with loss of bowel control? N  Managing your Medications? N  Managing your Finances? N  Housekeeping or managing your Housekeeping? N  Some recent data might be hidden    Patient Care Team: Jinny Sanders, MD as PCP - General (Family Medicine)    Assessment:     Hearing Screening   125Hz  250Hz  500Hz  1000Hz  2000Hz  3000Hz  4000Hz  6000Hz  8000Hz   Right ear:   40 40 40  40    Left ear:   40 0 40  40    Vision Screening Comments: Last vision exam in Sept 2017   Exercise Activities and Dietary recommendations Current Exercise Habits: Home exercise routine, Type of exercise: walking;Other - see comments (core exercises 10 min 3 days/wk), Time (Minutes): 60, Frequency (Times/Week): 7, Weekly Exercise (Minutes/Week): 420, Intensity: Mild, Exercise limited by: None identified  Goals    . Increase physical activity          Starting 04/05/2017, I will continue to walk at least 60 min daily and to do core exercises for 10 min 3 days per week.       Fall Risk Fall Risk  04/05/2017 04/03/2016 04/03/2016 03/29/2015  Falls in the past year? No Yes Yes Yes  Number falls in past yr: - 1 1 2  or more  Injury with Fall? - No No No  Risk for fall due to : - - - History of fall(s)  Follow up - Falls prevention discussed - -   Depression Screen PHQ 2/9 Scores 04/05/2017 04/03/2016 04/03/2016 03/29/2015  PHQ  - 2 Score 0 0 0 0  PHQ- 9 Score 0 - - -     Cognitive Function MMSE - Mini Mental State Exam 04/05/2017  Orientation to time 5  Orientation to Place 5  Registration 3  Attention/ Calculation 0  Recall 3  Language- name 2 objects 0  Language- repeat 1  Language- follow 3 step command 3  Language- read & follow direction 0  Write a sentence 0  Copy design 0  Total  score 20     PLEASE NOTE: A Mini-Cog screen was completed. Maximum score is 20. A value of 0 denotes this part of Folstein MMSE was not completed or the patient failed this part of the Mini-Cog screening.   Mini-Cog Screening Orientation to Time - Max 5 pts Orientation to Place - Max 5 pts Registration - Max 3 pts Recall - Max 3 pts Language Repeat - Max 1 pts Language Follow 3 Step Command - Max 3 pts     Immunization History  Administered Date(s) Administered  . Influenza Whole 06/06/2010  . Influenza,inj,Quad PF,6+ Mos 04/15/2015, 04/30/2016  . Pneumococcal Conjugate-13 03/16/2014  . Pneumococcal Polysaccharide-23 12/14/2011  . Td 08/29/2007  . Zoster 11/24/2010   Screening Tests Health Maintenance  Topic Date Due  . INFLUENZA VACCINE  11/03/2017 (Originally 03/06/2017)  . TETANUS/TDAP  08/28/2017  . MAMMOGRAM  09/18/2017  . COLONOSCOPY  08/04/2025  . DEXA SCAN  Completed  . Hepatitis C Screening  Completed  . PNA vac Low Risk Adult  Completed      Plan:     I have personally reviewed and addressed the Medicare Annual Wellness questionnaire and have noted the following in the patient's chart:  A. Medical and social history B. Use of alcohol, tobacco or illicit drugs  C. Current medications and supplements D. Functional ability and status E.  Nutritional status F.  Physical activity G. Advance directives H. List of other physicians I.  Hospitalizations, surgeries, and ER visits in previous 12 months J.  [place] to include hearing, vision, cognitive, depression L. Referrals and  appointments - none  In addition, I have reviewed and discussed with patient certain preventive protocols, quality metrics, and best practice recommendations. A written personalized care plan for preventive services as well as general preventive health recommendations were provided to patient.  See attached scanned questionnaire for additional information.   Signed,   Lindell Noe, MHA, BS, LPN Health Coach

## 2017-04-09 MED FILL — VENLAFAXINE HCL ER 75 MG CA: 75 | 30 days supply | Qty: 30 | Fill #7

## 2017-04-12 ENCOUNTER — Ambulatory Visit (INDEPENDENT_AMBULATORY_CARE_PROVIDER_SITE_OTHER): Payer: 59 | Admitting: Family Medicine

## 2017-04-12 ENCOUNTER — Encounter: Payer: Self-pay | Admitting: Family Medicine

## 2017-04-12 VITALS — BP 121/76 | HR 88 | Temp 98.2°F | Ht 67.5 in | Wt 162.0 lb

## 2017-04-12 DIAGNOSIS — J4520 Mild intermittent asthma, uncomplicated: Secondary | ICD-10-CM | POA: Diagnosis not present

## 2017-04-12 DIAGNOSIS — Z0000 Encounter for general adult medical examination without abnormal findings: Secondary | ICD-10-CM

## 2017-04-12 DIAGNOSIS — E7800 Pure hypercholesterolemia, unspecified: Secondary | ICD-10-CM

## 2017-04-12 DIAGNOSIS — I10 Essential (primary) hypertension: Secondary | ICD-10-CM | POA: Diagnosis not present

## 2017-04-12 NOTE — Assessment & Plan Note (Signed)
Almost at goal on lipitor 10 mg.. Counseled on low chol diet.

## 2017-04-12 NOTE — Progress Notes (Signed)
Subjective:    Patient ID: Christina Griffith, female    DOB: Apr 17, 1951, 66 y.o.   MRN: [id number]  HPI  The patient presents forcomplete physical and review of chronic health problems.   The patient saw Candis Musa, LPN for medicare wellness. Note reviewed in detail and important notes copied below. Health maintenance: Flu vaccine - addressed PPSV23 - administered Abnormal screenings:  Hearing - failed  TODAY: Hypertension:   Good control on losartan HCTZ. Using medication without problems or lightheadedness:  NONE Chest pain with exertion: NONE Edema:NONE Short of breath:none Average home BPs: 137-150/72-90 Other issues: Some increase in stress, good diet.  Occ using ibuprofen.  Wt Readings from Last 3 Encounters:  04/12/17 162 lb (73.5 kg)  04/05/17 166 lb (75.3 kg)  11/20/16 163 lb (73.9 kg)  Body mass index is 25 kg/m.    Elevated Cholesterol:  Good control on lipitor 10 mg daily Lab Results  Component Value Date   CHOL 191 04/05/2017   HDL 60.70 04/05/2017   LDLCALC 104 (H) 04/05/2017   TRIG 134.0 04/05/2017   CHOLHDL 3 04/05/2017  Using medications without problems: Muscle aches:  Diet compliance: good Exercise: walking 1 hour daily Other complaints:  Mild intermittent asthma:stab;le control. Using albuterol as needed.  Hx of endometrial cancer: Grade 1 endometrial cancer She then underwent a [place] BSO BPLND on 08/10/95 without complications. Her final pathologic diagnosis is a Stage 1A Grade 1 endometrioid endometrial cancer with no lymphovascular space invasion, no myometrial invasion and negative lymph nodes.  GYN ONC: Dr. [place]ble [place].. Followed every 6 months.  GYN is Dr. Nori Riis   On effexor for menopausal syndrome.  Now off estrogen.  Social History /Family History/Past Medical History reviewed in detail and updated in EMR if needed. Blood pressure 121/76, pulse 88, temperature 98.2 F (36.8 C), temperature source Oral, height 5' 7.5" (1.715  m), weight 162 lb (73.5 kg).  Review of Systems  Constitutional: Negative for fatigue and fever.  HENT: Negative for congestion.   Eyes: Negative for pain.  Respiratory: Negative for cough and shortness of breath.   Cardiovascular: Negative for chest pain, palpitations and leg swelling.  Gastrointestinal: Positive for constipation. Negative for abdominal pain.  Genitourinary: Negative for dysuria and vaginal bleeding.  Musculoskeletal: Negative for back pain.  Neurological: Negative for syncope, light-headedness and headaches.  Psychiatric/Behavioral: Negative for dysphoric mood.       Objective:   Physical Exam  Constitutional: Vital signs are normal. She appears well-developed and well-nourished. She is cooperative.  Non-toxic appearance. She does not appear ill. No distress.  HENT:  Head: Normocephalic.  Right Ear: Hearing, tympanic membrane, external ear and ear canal normal.  Left Ear: Hearing, tympanic membrane, external ear and ear canal normal.  Nose: Nose normal.  Eyes: Pupils are equal, round, and reactive to light. Conjunctivae, EOM and lids are normal. Lids are everted and swept, no foreign bodies found.  Neck: Trachea normal and normal range of motion. Neck supple. Carotid bruit is not present. No thyroid mass and no thyromegaly present.  Cardiovascular: Normal rate, regular rhythm, S1 normal, S2 normal, normal heart sounds and intact distal pulses.  Exam reveals no gallop.   No murmur heard. Pulmonary/Chest: Effort normal and breath sounds normal. No respiratory distress. She has no wheezes. She has no rhonchi. She has no rales.  Abdominal: Soft. Normal appearance and bowel sounds are normal. She exhibits no distension, no fluid wave, no abdominal bruit and no mass. There is no hepatosplenomegaly. There  is no tenderness. There is no rebound, no guarding and no CVA tenderness. No hernia.  Lymphadenopathy:    She has no cervical adenopathy.    She has no axillary  adenopathy.  Neurological: She is alert. She has normal strength. No cranial nerve deficit or sensory deficit.  Skin: Skin is warm, dry and intact. No rash noted.  Psychiatric: Her speech is normal and behavior is normal. Judgment normal. Her mood appears not anxious. Cognition and memory are normal. She does not exhibit a depressed mood.          Assessment & Plan:  The patient's preventative maintenance and recommended screening tests for an annual wellness exam were reviewed in full today. Brought up to date unless services declined.  Counselled on the importance of diet, exercise, and its role in overall health and mortality. The patient's FH and SH was reviewed, including their home life, tobacco status, and drug and alcohol status.   Vaccines:uptodate with Td,zoster, PNA, prevnar. Will get flu at work. Nonsmoker  Mammo: nml in 09/2016, repeat q 1 years DVE/PAP: Sees Dr. Rogelio Seen for GYN ONC . 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 SE of constipation. Stopped  Fosamax after surgery. .  Repeat  DXA 12/2015 NORMAL on no med. HCV neg in 2014.

## 2017-04-12 NOTE — Patient Instructions (Addendum)
Follow blood pressure at home.. Goal < 140/90.. Call if consistently elevated for additional medicine.  Continue healthy eating, low cholesterol diet and exercise.  Can return for cholesterol check if you would like in 3 months.

## 2017-04-12 NOTE — Assessment & Plan Note (Signed)
Stable control.. Flares only with infection. Using albuterol prn.

## 2017-04-12 NOTE — Assessment & Plan Note (Signed)
Borderline control on losartan HCTZ. Encouraged exercise, weight loss, healthy eating habits.  Follow at home.

## 2017-04-13 ENCOUNTER — Encounter: Payer: Self-pay | Admitting: Family Medicine

## 2017-04-23 DIAGNOSIS — Z9842 Cataract extraction status, left eye: Secondary | ICD-10-CM | POA: Diagnosis not present

## 2017-04-23 DIAGNOSIS — Z9841 Cataract extraction status, right eye: Secondary | ICD-10-CM | POA: Diagnosis not present

## 2017-04-23 DIAGNOSIS — H52223 Regular astigmatism, bilateral: Secondary | ICD-10-CM | POA: Diagnosis not present

## 2017-04-23 DIAGNOSIS — T84090D Other mechanical complication of internal right hip prosthesis, subsequent encounter: Secondary | ICD-10-CM | POA: Diagnosis not present

## 2017-04-23 DIAGNOSIS — Z96641 Presence of right artificial hip joint: Secondary | ICD-10-CM | POA: Diagnosis not present

## 2017-04-25 ENCOUNTER — Other Ambulatory Visit: Payer: Self-pay | Admitting: *Deleted

## 2017-04-25 MED ORDER — LOSARTAN POTASSIUM-HCTZ 100-25 MG PO TABS: 1.0000 | ORAL_TABLET | Freq: Every day | ORAL | 3 refills | Status: DC

## 2017-04-25 MED FILL — LOSARTAN-HCTZ 100-25 MG TAB: 100-25 | 90 days supply | Qty: 90 | Fill #0

## 2017-05-09 MED FILL — VENLAFAXINE HCL ER 75 MG CA: 75 | 30 days supply | Qty: 30 | Fill #8

## 2017-06-06 MED FILL — VENLAFAXINE HCL ER 75 MG CA: 75 | 30 days supply | Qty: 30 | Fill #9

## 2017-07-02 NOTE — Progress Notes (Signed)
Consult Note: Gyn-Onc  Christina Griffith 66 y.o. female  CC:  Chief Complaint  Patient presents with  . Endometrial cancer [facility])    HPI: Christina Griffith is a 66 y.o. year old initially seen in consultation for grade 1 endometrial cancer She then underwent a [place] BSO BPLND on 5/0/53 without complications. Her final pathologic diagnosis is a Stage 1A Grade 1 endometrioid endometrial cancer with no lymphovascular space invasion, no myometrial invasion and negative lymph nodes.  Interval History:  I last saw her in 10/17. In the interim she saw Dr. Nori Riis and had a negative exam and negative Pap smear in April.  She had a colonoscopy December 2016 with recommendations for 5 year follow-up.  She's overall doing fairly well. She did see Dr. Nori Riis in May and had a negative exam. At that time she reported that she stopped her HRT in January. She did have a Pap smear in May that was negative and unremarkable. As a result of stopping her estrogen replacement therapy she had did have significant hot flashes over the summer in addition her air-conditioning 1 out. They have improved with the cold weather and she has about for hot flashes per day and about 2 at night. Starting on Sunday she's had about 4 days of lower abdominal pain and discomfort and bloating. She thinks that the flare of her irritable bowel syndrome. This often happens if she's not eating her usual prepared foods and she had been traveling for Thanksgiving. She took some earwax and had a bowel movement this morning and is feeling better. She otherwise is without complaints. She walks about an hour per day she walks her dog. Her mammograms are up-to-date. There are no new medical problems and her family.  Review of Systems Constitutional: Denies fever. Continued vasomotor symptoms as above Skin: No rash Cardiovascular: No chest pain, shortness of breath, or edema  Pulmonary: No cough Gastro Intestinal:  No nausea, vomiting, mild  constipation. Acute symptoms as above. Genitourinary: Denies vaginal bleeding and discharge.  Musculoskeletal: No complaints Psychology: No complaints  Current Meds:  Outpatient Encounter Medications as of 07/03/2017  Medication Sig  . albuterol (PROVENTIL HFA;VENTOLIN HFA) 108 (90 Base) MCG/ACT inhaler Inhale 2 puffs into the lungs every 6 (six) hours as needed for wheezing or shortness of breath.  Marland Kitchen atorvastatin (LIPITOR) 10 MG tablet TAKE 1 TABLET BY MOUTH ONCE DAILY  . Black Cohosh-SoyIsoflav-Magnol (ESTROVEN MENOPAUSE RELIEF) CAPS   . Calcium Carb-Cholecalciferol (CALCIUM 600+D) 600-800 MG-UNIT TABS Take 1 tablet by mouth daily.  . fexofenadine (ALLEGRA) 180 MG tablet Take 180 mg by mouth daily as needed for allergies or rhinitis.  Marland Kitchen ibuprofen (ADVIL,MOTRIN) 200 MG tablet Take 600 mg by mouth every 8 (eight) hours as needed.  . Lactobacillus Rhamnosus, GG, (CULTURELLE) CAPS Take 1 capsule by mouth daily.  Marland Kitchen losartan-hydrochlorothiazide (HYZAAR) 100-25 MG tablet Take 1 tablet by mouth daily.  . magnesium gluconate (MAGONATE) 500 MG tablet Take 500 mg by mouth daily.  . Multiple Vitamin (MULTIVITAMIN) tablet Take 1 tablet by mouth daily.  . Polyethylene Glycol 3350 (MIRALAX PO) Take 17 g by mouth as needed.   . Triamcinolone Acetonide (NASACORT ALLERGY 24HR NA) Place 1 spray into the nose as needed.   . venlafaxine XR (EFFEXOR-XR) 75 MG 24 hr capsule TAKE 1 CAPSULE BY MOUTH AT BEDTIME  . [DISCONTINUED] atorvastatin (LIPITOR) 10 MG tablet TAKE 1 TABLET BY MOUTH ONCE DAILY   No facility-administered encounter medications on file as of 07/03/2017.  Allergy: No Known Allergies  Social Hx:   Social History   Socioeconomic History  . Marital status: Single    Spouse name: Not on file  . Number of children: Not on file  . Years of education: Not on file  . Highest education level: Not on file  Social Needs  . Financial resource strain: Not on file  . Food insecurity - worry: Not  on file  . Food insecurity - inability: Not on file  . Transportation needs - medical: Not on file  . Transportation needs - non-medical: Not on file  Occupational History  . Occupation: Counselling psychologist: [employer]  Tobacco Use  . Smoking status: Never Smoker  . Smokeless tobacco: Never Used  Substance and Sexual Activity  . Alcohol use: Yes    Comment: 3 x a week  . Drug use: No  . Sexual activity: Not on file  Other Topics Concern  . Not on file  Social History Narrative   Regular exercise- yes, 5-6 days a week, yoga   Diet: healthy    Past Surgical Hx:  Past Surgical History:  Procedure Laterality Date  . BREAST SURGERY Right 1980's   breast biopsy  . COLONOSCOPY    . EYE SURGERY Bilateral 02/2015   cataracts  . HYSTEROSCOPY W/D&C Left 1/24/204   w/ resection of Endometrial polyps  . ROBOTIC ASSISTED TOTAL HYSTERECTOMY WITH BILATERAL SALPINGO OOPHERECTOMY N/A 10/07/2012   Procedure: ROBOTIC ASSISTED TOTAL HYSTERECTOMY WITH BILATERAL SALPINGO OOPHORECTOMY/POSSIBLE LYMPH NODE BIOPSY;  Surgeon: Imagene Gurney A. Alycia Rossetti, MD;  Location: [facility];  Service: Gynecology;  Laterality: N/A;  . TOTAL HIP ARTHROPLASTY     right 2002 left 2004  . TOTAL HIP REVISION Right 05/09/2015   Procedure: TOTAL HIP REVISION;  Surgeon: Frederik Pear, MD;  Location: [place];  Service: Orthopedics;  Laterality: Right;    Past Medical Hx:  Past Medical History:  Diagnosis Date  . Arthritis    spinal stenosis--back pain--hx of herniated cervical disk- but no surgery  . Asthma   . Cold 10/03/12   pt getting over a cold--still has some head congestion, slight cough  . Colon polyps   . Endometrial ca ([place])   . H/O: hematuria    benign essential hematuria - per urology work up   . History of depression   . History of pneumonia   . Hypertension   . Numbness in right leg    pt. states occassional in right leg and right big toe from disc herniations  . PONV (postoperative nausea and  vomiting)    with prior hip replacement  . Stress incontinence   . Wears glasses     Oncology Hx:    History of endometrial cancer   10/07/2012 Surgery    RTH/BSO adn BPLND. IA grade 1       Family Hx:  Family History  Problem Relation Age of Onset  . Hypertension Mother   . Cancer Father        bladder and prostate  . Hyperlipidemia Father   . Cancer Maternal Grandmother        colon  . Breast cancer Cousin     Vitals:  Blood pressure 126/66, pulse 86, temperature 98 F (36.7 C), temperature source Oral, resp. rate 20, height 5' 7.75" (1.721 m), weight 157 lb 12.8 oz (71.6 kg), SpO2 100 %.  Physical Exam: Well-nourished well-developed female in no acute distress.  Neck: Supple, no lymphadenopathy no thyromegaly.  Lungs: Clear to auscultation.  Cardiovascular: Regular rate and rhythm.  Abdomen: Well-healed incisions. Abdomen is soft, nontender, nondistended. There are no palpable masses or hepatosplenomegaly.  Extremities: No edema.  Groins: No lymphadenopathy.  Pelvic: Normal external female genitalia. Vagina slightly atrophic. There are no visible lesions. Bimanual examination reveals no masses or nodularity. Rectal confirms  Assessment/Plan: 66 y.o. year old with Stage IA Grade 1 endometrioid endometrial cancer status post definitive surgery March 2014. She's quickly approaching her 5 year anniversary. Plan:  1) She will follow up with Dr. Nori Riis in 6 months and return to see [place] in one year. That visit will be her last visit with [place] that she will completed 5 years of follow-up. At that time she can follow-up with Dr. Nori Riis for her regularly scheduled gynecologic care. 2) She'll follow-up with her physicians as scheduled.    Younique Casad A., MD 07/03/2017, 9:38 AM

## 2017-07-03 ENCOUNTER — Other Ambulatory Visit: Payer: Self-pay | Admitting: Family Medicine

## 2017-07-03 ENCOUNTER — Encounter: Payer: Self-pay | Admitting: Gynecologic Oncology

## 2017-07-03 ENCOUNTER — Ambulatory Visit: Payer: 59 | Attending: Gynecologic Oncology | Admitting: Gynecologic Oncology

## 2017-07-03 VITALS — BP 126/66 | HR 86 | Temp 98.0°F | Resp 20 | Ht 67.75 in | Wt 157.8 lb

## 2017-07-03 DIAGNOSIS — K589 Irritable bowel syndrome without diarrhea: Secondary | ICD-10-CM | POA: Diagnosis not present

## 2017-07-03 DIAGNOSIS — Z8542 Personal history of malignant neoplasm of other parts of uterus: Secondary | ICD-10-CM | POA: Diagnosis not present

## 2017-07-03 DIAGNOSIS — Z79899 Other long term (current) drug therapy: Secondary | ICD-10-CM | POA: Insufficient documentation

## 2017-07-03 DIAGNOSIS — C541 Malignant neoplasm of endometrium: Secondary | ICD-10-CM | POA: Insufficient documentation

## 2017-07-03 DIAGNOSIS — I10 Essential (primary) hypertension: Secondary | ICD-10-CM | POA: Diagnosis not present

## 2017-07-03 DIAGNOSIS — Z96642 Presence of left artificial hip joint: Secondary | ICD-10-CM | POA: Diagnosis not present

## 2017-07-03 DIAGNOSIS — F329 Major depressive disorder, single episode, unspecified: Secondary | ICD-10-CM | POA: Diagnosis not present

## 2017-07-03 MED FILL — ATORVASTATIN 10 MG TABLET: 10 | 90 days supply | Qty: 90 | Fill #0

## 2017-07-03 NOTE — Patient Instructions (Signed)
Follow-up with Dr. Nori Riis in 6 months and return to see [place] in one year.

## 2017-07-09 MED FILL — VENLAFAXINE HCL ER 75 MG CA: 75 | 30 days supply | Qty: 30 | Fill #10

## 2017-07-25 MED FILL — LOSARTAN-HCTZ 100-25 MG TAB: 100-25 | 90 days supply | Qty: 90 | Fill #1

## 2017-08-07 MED FILL — VENLAFAXINE HCL ER 75 MG CA: 75 | 30 days supply | Qty: 30 | Fill #11

## 2017-09-05 MED FILL — VENLAFAXINE HCL ER 75 MG CA: 75 | 30 days supply | Qty: 30 | Fill #12

## 2017-09-20 DIAGNOSIS — Z1231 Encounter for screening mammogram for malignant neoplasm of breast: Secondary | ICD-10-CM | POA: Diagnosis not present

## 2017-10-03 MED FILL — ATORVASTATIN 10 MG TABLET: 10 | 90 days supply | Qty: 90 | Fill #1

## 2017-10-07 ENCOUNTER — Other Ambulatory Visit: Payer: Self-pay | Admitting: Gynecologic Oncology

## 2017-10-07 MED FILL — VENLAFAXINE HCL ER 75 MG CA: 75 | 30 days supply | Qty: 30 | Fill #0

## 2017-10-29 MED FILL — LOSARTAN-HCTZ 100-25 MG TAB: 100-25 | 90 days supply | Qty: 90 | Fill #2

## 2017-11-05 MED FILL — VENLAFAXINE HCL ER 75 MG CA: 75 | 30 days supply | Qty: 30 | Fill #1

## 2017-11-21 ENCOUNTER — Encounter: Payer: Self-pay | Admitting: Pediatrics

## 2017-12-09 MED FILL — VENLAFAXINE HCL ER 75 MG CA: 75 | 30 days supply | Qty: 30 | Fill #2

## 2017-12-17 DIAGNOSIS — Z6824 Body mass index (BMI) 24.0-24.9, adult: Secondary | ICD-10-CM | POA: Diagnosis not present

## 2017-12-17 DIAGNOSIS — Z01419 Encounter for gynecological examination (general) (routine) without abnormal findings: Secondary | ICD-10-CM | POA: Diagnosis not present

## 2017-12-27 MED FILL — ATORVASTATIN 10 MG TABLET: 10 | 90 days supply | Qty: 90 | Fill #2

## 2018-01-03 MED FILL — VENLAFAXINE HCL ER 75 MG CA: 75 | 30 days supply | Qty: 30 | Fill #3

## 2018-01-30 MED FILL — LOSARTAN-HCTZ 100-25 MG TAB: 100-25 | 90 days supply | Qty: 90 | Fill #3

## 2018-02-03 MED FILL — VENLAFAXINE HCL ER 75 MG CA: 75 | 30 days supply | Qty: 30 | Fill #4

## 2018-03-07 MED FILL — VENLAFAXINE HCL ER 75 MG CA: 75 | 30 days supply | Qty: 30 | Fill #5

## 2018-03-31 ENCOUNTER — Other Ambulatory Visit: Payer: Self-pay | Admitting: Family Medicine

## 2018-03-31 MED FILL — ATORVASTATIN 10 MG TABLET: 10 | 90 days supply | Qty: 90 | Fill #0

## 2018-04-08 MED FILL — VENLAFAXINE HCL ER 75 MG CA: 75 | 30 days supply | Qty: 30 | Fill #6

## 2018-04-13 ENCOUNTER — Telehealth: Payer: Self-pay | Admitting: Family Medicine

## 2018-04-13 DIAGNOSIS — E7800 Pure hypercholesterolemia, unspecified: Secondary | ICD-10-CM

## 2018-04-13 NOTE — Telephone Encounter (Signed)
-----   Message from Eustace Pen, LPN sent at 7/0/0174  4:10 PM EDT ----- Regarding: Labs 9/13 Lab orders needed. Thank you.  Insurance:  [employer]

## 2018-04-18 ENCOUNTER — Ambulatory Visit (INDEPENDENT_AMBULATORY_CARE_PROVIDER_SITE_OTHER): Payer: 59

## 2018-04-18 VITALS — BP 118/72 | HR 79 | Temp 97.9°F | Ht 67.0 in | Wt 154.2 lb

## 2018-04-18 DIAGNOSIS — Z0000 Encounter for general adult medical examination without abnormal findings: Secondary | ICD-10-CM | POA: Diagnosis not present

## 2018-04-18 DIAGNOSIS — E7800 Pure hypercholesterolemia, unspecified: Secondary | ICD-10-CM

## 2018-04-18 DIAGNOSIS — Z23 Encounter for immunization: Secondary | ICD-10-CM | POA: Diagnosis not present

## 2018-04-18 LAB — COMPREHENSIVE METABOLIC PANEL
ALT: 26 U/L (ref 0–35)
AST: 23 U/L (ref 0–37)
Albumin: 4.3 g/dL (ref 3.5–5.2)
Alkaline Phosphatase: 59 U/L (ref 39–117)
BUN: 20 mg/dL (ref 6–23)
CO2: 30 mEq/L (ref 19–32)
Calcium: 9.9 mg/dL (ref 8.4–10.5)
Chloride: 102 mEq/L (ref 96–112)
Creatinine, Ser: 0.76 mg/dL (ref 0.40–1.20)
GFR: 80.34 mL/min (ref >60.00)
Glucose, Bld: 88 mg/dL (ref 70–99)
Potassium: 3.9 mEq/L (ref 3.5–5.1)
Sodium: 140 mEq/L (ref 135–145)
Total Bilirubin: 0.5 mg/dL (ref 0.2–1.2)
Total Protein: 7.1 g/dL (ref 6.0–8.3)

## 2018-04-18 LAB — LIPID PANEL
Cholesterol: 159 mg/dL (ref 0–200)
HDL: 54.3 mg/dL (ref >39.00)
LDL Cholesterol: 78 mg/dL (ref 0–99)
NonHDL: 104.7
Total CHOL/HDL Ratio: 3
Triglycerides: 132 mg/dL (ref 0.0–149.0)
VLDL: 26.4 mg/dL (ref 0.0–40.0)

## 2018-04-18 NOTE — Progress Notes (Signed)
PCP notes:   Health maintenance:  Flu vaccine - administered Tetanus vaccine - postponed/insurance  Abnormal screenings:   None  Patient concerns:   None  Nurse concerns:  None  Next PCP appt:   04/25/18 @ 1115

## 2018-04-18 NOTE — Progress Notes (Signed)
Subjective:   Christina Griffith is a 67 y.o. female who presents for Medicare Annual (Subsequent) preventive examination.  Review of Systems:  N/A Cardiac Risk Factors include: advanced age (>61men, >52 women);dyslipidemia;hypertension     Objective:     Vitals: BP 118/72 (BP Location: Right Arm, Patient Position: Sitting, Cuff Size: Normal)   Pulse 79   Temp 97.9 F (36.6 C) (Oral)   Ht 5\' 7"  (1.702 m) Comment: no shoes  Wt 154 lb 4 oz (70 kg)   SpO2 97%   BMI 24.16 kg/m   Body mass index is 24.16 kg/m.  Advanced Directives 04/18/2018 07/03/2017 04/05/2017 04/03/2016 05/09/2015 04/29/2015 10/07/2012  Does Patient Have a Medical Advance Directive? Yes Yes Yes Yes Yes Yes Patient has advance directive, copy not in chart  Type of Advance Directive [place];Living will Living will [place];Living will [place];Living will Living will [place];Living will [place];Living will  Does patient want to make changes to medical advance directive? - - No - Patient declined No - Patient declined - No - Patient declined -  Copy of [place] in Chart? Yes Yes Yes Yes Yes No - copy requested -  Pre-existing out of facility DNR order (yellow form or pink MOST form) - - - - - - No    Tobacco Social History   Tobacco Use  Smoking Status Never Smoker  Smokeless Tobacco Never Used     Counseling given: No   Clinical Intake:  Pre-visit preparation completed: Yes  Pain : 0-10 Pain Score: 3  Pain Type: Chronic pain Pain Location: Hand Pain Orientation: Left Pain Onset: More than a month ago Pain Frequency: Intermittent     Nutritional Status: BMI 25 -29 Overweight Nutritional Risks: None Diabetes: No  How often do you need to have someone help you when you read instructions, pamphlets, or other written materials from your doctor or pharmacy?: 1 - Never What is the  last grade level you completed in school?: Master degree  Interpreter Needed?: No  Comments: pt lives alone Information entered by :: LPinson, LPN  Past Medical History:  Diagnosis Date  . Arthritis    spinal stenosis--back pain--hx of herniated cervical disk- but no surgery  . Asthma   . Cold 10/03/12   pt getting over a cold--still has some head congestion, slight cough  . Colon polyps   . Endometrial ca ([place])   . H/O: hematuria    benign essential hematuria - per urology work up   . History of depression   . History of pneumonia   . Hypertension   . Numbness in right leg    pt. states occassional in right leg and right big toe from disc herniations  . PONV (postoperative nausea and vomiting)    with prior hip replacement  . Stress incontinence   . Wears glasses    Past Surgical History:  Procedure Laterality Date  . BREAST SURGERY Right 1980's   breast biopsy  . COLONOSCOPY    . EYE SURGERY Bilateral 02/2015   cataracts  . HYSTEROSCOPY W/D&C Left 1/24/204   w/ resection of Endometrial polyps  . ROBOTIC ASSISTED TOTAL HYSTERECTOMY WITH BILATERAL SALPINGO OOPHERECTOMY N/A 10/07/2012   Procedure: ROBOTIC ASSISTED TOTAL HYSTERECTOMY WITH BILATERAL SALPINGO OOPHORECTOMY/POSSIBLE LYMPH NODE BIOPSY;  Surgeon: Imagene Gurney A. Alycia Rossetti, MD;  Location: [facility];  Service: Gynecology;  Laterality: N/A;  . TOTAL HIP ARTHROPLASTY  right 2002 left 2004  . TOTAL HIP REVISION Right 05/09/2015   Procedure: TOTAL HIP REVISION;  Surgeon: Frederik Pear, MD;  Location: [facility];  Service: Orthopedics;  Laterality: Right;   Family History  Problem Relation Age of Onset  . Hypertension Mother   . Cancer Father        bladder and prostate  . Hyperlipidemia Father   . Cancer Maternal Grandmother        colon  . Breast cancer Cousin    Social History   Socioeconomic History  . Marital status: Single    Spouse name: Not on file  . Number of children: Not on file  . Years of education: Not on file    . Highest education level: Not on file  Occupational History  . Occupation: Counselling psychologist: [employer]  Social Needs  . Financial resource strain: Not on file  . Food insecurity:    Worry: Not on file    Inability: Not on file  . Transportation needs:    Medical: Not on file    Non-medical: Not on file  Tobacco Use  . Smoking status: Never Smoker  . Smokeless tobacco: Never Used  Substance and Sexual Activity  . Alcohol use: Yes    Alcohol/week: 3.0 standard drinks    Types: 3 Glasses of wine per week    Comment: 3 x a week  . Drug use: No  . Sexual activity: Not on file  Lifestyle  . Physical activity:    Days per week: Not on file    Minutes per session: Not on file  . Stress: Not on file  Relationships  . Social connections:    Talks on phone: Not on file    Gets together: Not on file    Attends religious service: Not on file    Active member of club or organization: Not on file    Attends meetings of clubs or organizations: Not on file    Relationship status: Not on file  Other Topics Concern  . Not on file  Social History Narrative   Regular exercise- yes, 5-6 days a week, yoga   Diet: healthy    Outpatient Encounter Medications as of 04/18/2018  Medication Sig  . albuterol (PROVENTIL HFA;VENTOLIN HFA) 108 (90 Base) MCG/ACT inhaler Inhale 2 puffs into the lungs every 6 (six) hours as needed for wheezing or shortness of breath.  Marland Kitchen atorvastatin (LIPITOR) 10 MG tablet TAKE 1 TABLET BY MOUTH ONCE DAILY  . Black Cohosh-SoyIsoflav-Magnol (ESTROVEN MENOPAUSE RELIEF) CAPS   . Calcium Carb-Cholecalciferol (CALCIUM 600+D) 600-800 MG-UNIT TABS Take 1 tablet by mouth daily.  . fexofenadine (ALLEGRA) 180 MG tablet Take 180 mg by mouth daily as needed for allergies or rhinitis.  Marland Kitchen ibuprofen (ADVIL,MOTRIN) 200 MG tablet Take 600 mg by mouth every 8 (eight) hours as needed.  . Lactobacillus Rhamnosus, GG, (CULTURELLE) CAPS Take 1 capsule by mouth daily.   Marland Kitchen losartan-hydrochlorothiazide (HYZAAR) 100-25 MG tablet Take 1 tablet by mouth daily.  . magnesium gluconate (MAGONATE) 500 MG tablet Take 500 mg by mouth daily.  . Multiple Vitamin (MULTIVITAMIN) tablet Take 1 tablet by mouth daily.  . Polyethylene Glycol 3350 (MIRALAX PO) Take 17 g by mouth as needed.   . Triamcinolone Acetonide (NASACORT ALLERGY 24HR NA) Place 1 spray into the nose as needed.   . venlafaxine XR (EFFEXOR-XR) 75 MG 24 hr capsule TAKE 1 CAPSULE BY MOUTH AT BEDTIME   No facility-administered  encounter medications on file as of 04/18/2018.     Activities of Daily Living In your present state of health, do you have any difficulty performing the following activities: 04/18/2018  Hearing? Y  Comment background noise in crowded room causes some hearing difficulty   Vision? N  Difficulty concentrating or making decisions? N  Walking or climbing stairs? N  Dressing or bathing? N  Doing errands, shopping? N  Preparing Food and eating ? N  Using the Toilet? N  In the past six months, have you accidently leaked urine? Y  Do you have problems with loss of bowel control? N  Managing your Medications? N  Managing your Finances? N  Housekeeping or managing your Housekeeping? N  Some recent data might be hidden    Patient Care Team: Jinny Sanders, MD as PCP - General (Family Medicine)    Assessment:   This is a routine wellness examination for [place].  Exercise Activities and Dietary recommendations Current Exercise Habits: Home exercise routine, Type of exercise: walking, Time (Minutes): 60, Frequency (Times/Week): 7, Weekly Exercise (Minutes/Week): 420, Intensity: Moderate, Exercise limited by: None identified  Goals    . Increase physical activity     Starting 04/18/2018, I will continue to walk at least 60 minutes daily.        Fall Risk Fall Risk  04/18/2018 04/05/2017 04/03/2016 04/03/2016 03/29/2015  Falls in the past year? No No Yes Yes Yes  Number falls in  past yr: - - 1 1 2  or more  Injury with Fall? - - No No No  Risk for fall due to : - - - - History of fall(s)  Follow up - - Falls prevention discussed - -    Depression Screen PHQ 2/9 Scores 04/18/2018 04/05/2017 04/03/2016 04/03/2016  PHQ - 2 Score 0 0 0 0  PHQ- 9 Score 0 0 - -     Cognitive Function MMSE - Mini Mental State Exam 04/18/2018 04/05/2017  Orientation to time 5 5  Orientation to Place 5 5  Registration 3 3  Attention/ Calculation 0 0  Recall 3 3  Language- name 2 objects 0 0  Language- repeat 1 1  Language- follow 3 step command 3 3  Language- read & follow direction 0 0  Write a sentence 0 0  Copy design 0 0  Total score 20 20        Immunization History  Administered Date(s) Administered  . Influenza Whole 06/06/2010  . Influenza,inj,Quad PF,6+ Mos 04/15/2015, 04/30/2016, 04/18/2018  . Pneumococcal Conjugate-13 03/16/2014  . Pneumococcal Polysaccharide-23 12/14/2011, 04/05/2017  . Td 08/29/2007  . Zoster 11/24/2010    Screening Tests Health Maintenance  Topic Date Due  . TETANUS/TDAP  08/06/2019 (Originally 08/28/2017)  . MAMMOGRAM  09/20/2018  . COLONOSCOPY  08/04/2025  . INFLUENZA VACCINE  Completed  . DEXA SCAN  Completed  . Hepatitis C Screening  Completed  . PNA vac Low Risk Adult  Completed      Plan:     I have personally reviewed, addressed, and noted the following in the patient's chart:  A. Medical and social history B. Use of alcohol, tobacco or illicit drugs  C. Current medications and supplements D. Functional ability and status E.  Nutritional status F.  Physical activity G. Advance directives H. List of other physicians I.  Hospitalizations, surgeries, and ER visits in previous 12 months J.  [place] to include hearing, vision, cognitive, depression L. Referrals and appointments - none  In addition, I have reviewed and discussed with patient certain preventive protocols, quality metrics, and best practice  recommendations. A written personalized care plan for preventive services as well as general preventive health recommendations were provided to patient.  See attached scanned questionnaire for additional information.   Signed,   Lindell Noe, MHA, BS, LPN Health Coach

## 2018-04-18 NOTE — Progress Notes (Signed)
I reviewed health advisor's note, was available for consultation, and agree with documentation and plan.  

## 2018-04-18 NOTE — Patient Instructions (Signed)
Christina Griffith , Thank you for taking time to come for your Medicare Wellness Visit. I appreciate your ongoing commitment to your health goals. Please review the following plan we discussed and let me know if I can assist you in the future.   These are the goals we discussed: Goals    . Increase physical activity     Starting 04/18/2018, I will continue to walk at least 60 minutes daily.        This is a list of the screening recommended for you and due dates:  Health Maintenance  Topic Date Due  . Tetanus Vaccine  08/06/2019*  . Mammogram  09/20/2018  . Colon Cancer Screening  08/04/2025  . Flu Shot  Completed  . DEXA scan (bone density measurement)  Completed  .  Hepatitis C: One time screening is recommended by Center for Disease Control  (CDC) for  adults born from [birth year] through 1966.   Completed  . Pneumonia vaccines  Completed  *Topic was postponed. The date shown is not the original due date.   Preventive Care for Adults  A healthy lifestyle and preventive care can promote health and wellness. Preventive health guidelines for adults include the following key practices.  . A routine yearly physical is a good way to check with your health care provider about your health and preventive screening. It is a chance to share any concerns and updates on your health and to receive a thorough exam.  . Visit your dentist for a routine exam and preventive care every 6 months. Brush your teeth twice a day and floss once a day. Good oral hygiene prevents tooth decay and gum disease.  . The frequency of eye exams is based on your age, health, family medical history, use  of contact lenses, and other factors. Follow your health care provider's recommendations for frequency of eye exams.  . Eat a healthy diet. Foods like vegetables, fruits, whole grains, low-fat dairy products, and lean protein foods contain the nutrients you need without too many calories. Decrease your intake of foods high in  solid fats, added sugars, and salt. Eat the right amount of calories for you. Get information about a proper diet from your health care provider, if necessary.  . Regular physical exercise is one of the most important things you can do for your health. Most adults should get at least 150 minutes of moderate-intensity exercise (any activity that increases your heart rate and causes you to sweat) each week. In addition, most adults need muscle-strengthening exercises on 2 or more days a week.  Silver Sneakers may be a benefit available to you. To determine eligibility, you may visit the website: www.silversneakers.com or contact program at [phone] Mon-Fri between 8AM-8PM.   . Maintain a healthy weight. The body mass index (BMI) is a screening tool to identify possible weight problems. It provides an estimate of body fat based on height and weight. Your health care provider can find your BMI and can help you achieve or maintain a healthy weight.   For adults 20 years and older: ? A BMI below 18.5 is considered underweight. ? A BMI of 18.5 to 24.9 is normal. ? A BMI of 25 to 29.9 is considered overweight. ? A BMI of 30 and above is considered obese.   . Maintain normal blood lipids and cholesterol levels by exercising and minimizing your intake of saturated fat. Eat a balanced diet with plenty of fruit and vegetables. Blood tests for lipids and cholesterol  should begin at age 64 and be repeated every 5 years. If your lipid or cholesterol levels are high, you are over 50, or you are at high risk for heart disease, you may need your cholesterol levels checked more frequently. Ongoing high lipid and cholesterol levels should be treated with medicines if diet and exercise are not working.  . If you smoke, find out from your health care provider how to quit. If you do not use tobacco, please do not start.  . If you choose to drink alcohol, please do not consume more than 2 drinks per day. One drink  is considered to be 12 ounces (355 mL) of beer, 5 ounces (148 mL) of wine, or 1.5 ounces (44 mL) of liquor.  . If you are 38-47 years old, ask your health care provider if you should take aspirin to prevent strokes.  . Use sunscreen. Apply sunscreen liberally and repeatedly throughout the day. You should seek shade when your shadow is shorter than you. Protect yourself by wearing long sleeves, pants, a wide-brimmed hat, and sunglasses year round, whenever you are outdoors.  . Once a month, do a whole body skin exam, using a mirror to look at the skin on your back. Tell your health care provider of new moles, moles that have irregular borders, moles that are larger than a pencil eraser, or moles that have changed in shape or color.

## 2018-04-25 ENCOUNTER — Encounter: Payer: Self-pay | Admitting: *Deleted

## 2018-04-25 ENCOUNTER — Ambulatory Visit (INDEPENDENT_AMBULATORY_CARE_PROVIDER_SITE_OTHER): Payer: 59 | Admitting: Family Medicine

## 2018-04-25 ENCOUNTER — Encounter: Payer: Self-pay | Admitting: Family Medicine

## 2018-04-25 VITALS — BP 120/64 | HR 87 | Temp 98.3°F | Ht 67.0 in | Wt 155.8 lb

## 2018-04-25 DIAGNOSIS — Z0000 Encounter for general adult medical examination without abnormal findings: Secondary | ICD-10-CM

## 2018-04-25 DIAGNOSIS — Z8542 Personal history of malignant neoplasm of other parts of uterus: Secondary | ICD-10-CM | POA: Diagnosis not present

## 2018-04-25 DIAGNOSIS — H17812 Minor opacity of cornea, left eye: Secondary | ICD-10-CM | POA: Diagnosis not present

## 2018-04-25 DIAGNOSIS — J4520 Mild intermittent asthma, uncomplicated: Secondary | ICD-10-CM

## 2018-04-25 DIAGNOSIS — H52223 Regular astigmatism, bilateral: Secondary | ICD-10-CM | POA: Diagnosis not present

## 2018-04-25 DIAGNOSIS — I10 Essential (primary) hypertension: Secondary | ICD-10-CM | POA: Diagnosis not present

## 2018-04-25 DIAGNOSIS — E7800 Pure hypercholesterolemia, unspecified: Secondary | ICD-10-CM

## 2018-04-25 DIAGNOSIS — Z9841 Cataract extraction status, right eye: Secondary | ICD-10-CM | POA: Diagnosis not present

## 2018-04-25 DIAGNOSIS — Z9842 Cataract extraction status, left eye: Secondary | ICD-10-CM | POA: Diagnosis not present

## 2018-04-25 DIAGNOSIS — M25532 Pain in left wrist: Secondary | ICD-10-CM | POA: Diagnosis not present

## 2018-04-25 NOTE — Assessment & Plan Note (Signed)
Well controlled. Continue current medication. Encouraged exercise, weight loss, healthy eating habits.  

## 2018-04-25 NOTE — Patient Instructions (Addendum)
Keep up healthy eating habits and regular exercise.  Wrist pain: Appears to be combination of  carpal tunnel and left MCP joint arthritis. thumb spica splint.. wear at night for 2 weeks and try ibuprofen.. If not improving follow up with ortho for possible steroid injection vs eval with nerve conduction.

## 2018-04-25 NOTE — Assessment & Plan Note (Signed)
Well controlled. Continue current medication.  

## 2018-04-25 NOTE — Assessment & Plan Note (Signed)
Stable using albuterol prn.

## 2018-04-25 NOTE — Assessment & Plan Note (Signed)
Hx of endometrial cancer: Grade 1 endometrial cancer She then underwent a [place] BSO BPLND on 11/08/90 without complications. Her final pathologic diagnosis is a Stage 1A Grade 1 endometrioid endometrial cancer with no lymphovascular space invasion, no myometrial invasion and negative lymph nodes.  GYN ONC: Dr. Syble Creek.. Followed every 6 months.  GYN is Dr. Nori Riis

## 2018-04-25 NOTE — Progress Notes (Signed)
Subjective:    Patient ID: Christina Griffith, female    DOB: May 05, 1951, 67 y.o.   MRN: [id number]  HPI The patient presents for annual medicare wellness, complete physical and review of chronic health problems. He/She also has the following acute concerns today:                        Left wrist pain  The patient saw Candis Musa, LPN for medicare wellness. Note reviewed in detail and important notes copied below. Health maintenance:  Flu vaccine - administered Tetanus vaccine - postponed/insurance  Abnormal screenings:  None Patient concerns:  None  04/25/18 Today  Elevated Cholesterol:  Excellent control on atorvastatin. Has cut out red meat, sausage, ham and bacon. Lab Results  Component Value Date   CHOL 159 04/18/2018   HDL 54.30 04/18/2018   LDLCALC 78 04/18/2018   TRIG 132.0 04/18/2018   CHOLHDL 3 04/18/2018  Using medications without problems: Muscle aches:  Diet compliance: good Exercise: dog walking  1 hour a day Other complaints:  Hypertension:   Good control on losartan HCTZ  Using medication without problems or lightheadedness:  Chest pain with exertion: Edema: Short of breath: Average home BPs: Other issues:  Mild intermittent asthma:stab;le control. Using albuterol as needed.  left wrist pain and numbness... Also pain at base of left MCP joint.  Hx of endometrial cancer: Grade 1 endometrial cancer She then underwent a [place] BSO BPLND on 10/12/01 without complications. Her final pathologic diagnosis is a Stage 1A Grade 1 endometrioid endometrial cancer with no lymphovascular space invasion, no myometrial invasion and negative lymph nodes.  GYN ONC: Dr. Syble Creek.. Followed every 6 months.  GYN is Dr. Nori Riis   On effexor for menopausal syndrome.  Now off estrogen.  BP Readings from Last 3 Encounters:  04/25/18 120/64  04/18/18 118/72  07/03/17 126/66    Body mass index is 24.39 kg/m. Wt Readings from Last 3 Encounters:  04/25/18  155 lb 12 oz (70.6 kg)  04/18/18 154 lb 4 oz (70 kg)  07/03/17 157 lb 12.8 oz (71.6 kg)    Social History /Family History/Past Medical History reviewed in detail and updated in EMR if needed. Blood pressure 120/64, pulse 87, temperature 98.3 F (36.8 C), temperature source Oral, height 5\' 7"  (1.702 m), weight 155 lb 12 oz (70.6 kg).   Review of Systems  Constitutional: Negative for fatigue and fever.  HENT: Negative for congestion.   Eyes: Negative for pain.  Respiratory: Negative for cough and shortness of breath.   Cardiovascular: Negative for chest pain, palpitations and leg swelling.  Gastrointestinal: Positive for constipation. Negative for abdominal pain.       Using miralax every other day with relief.  Genitourinary: Negative for dysuria and vaginal bleeding.  Musculoskeletal: Negative for back pain.  Neurological: Negative for syncope, light-headedness and headaches.  Psychiatric/Behavioral: Negative for dysphoric mood.       Objective:   Physical Exam  Constitutional: Vital signs are normal. She appears well-developed and well-nourished. She is cooperative.  Non-toxic appearance. She does not appear ill. No distress.  HENT:  Head: Normocephalic.  Right Ear: Hearing, tympanic membrane, external ear and ear canal normal.  Left Ear: Hearing, tympanic membrane, external ear and ear canal normal.  Nose: Nose normal.  Small dry flaky lesion on nose.. Try OTC cortisone  Eyes: Pupils are equal, round, and reactive to light. Conjunctivae, EOM and lids are normal. Lids are everted and swept, no foreign  bodies found.  Neck: Trachea normal and normal range of motion. Neck supple. Carotid bruit is not present. No thyroid mass and no thyromegaly present.  Cardiovascular: Normal rate, regular rhythm, S1 normal, S2 normal, normal heart sounds and intact distal pulses. Exam reveals no gallop.  No murmur heard. Pulmonary/Chest: Effort normal and breath sounds normal. No respiratory  distress. She has no wheezes. She has no rhonchi. She has no rales.  Abdominal: Soft. Normal appearance and bowel sounds are normal. She exhibits no distension, no fluid wave, no abdominal bruit and no mass. There is no hepatosplenomegaly. There is no tenderness. There is no rebound, no guarding and no CVA tenderness. No hernia.  Musculoskeletal:       Left wrist: She exhibits decreased range of motion, tenderness and bony tenderness.  ttp base o MCP, positive gribd test, neg tinela nd phlen, neg ulnar compression  Lymphadenopathy:    She has no cervical adenopathy.    She has no axillary adenopathy.  Neurological: She is alert. She has normal strength. No cranial nerve deficit or sensory deficit.  Skin: Skin is warm, dry and intact. No rash noted.  Psychiatric: Her speech is normal and behavior is normal. Judgment normal. Her mood appears not anxious. Cognition and memory are normal. She does not exhibit a depressed mood.          Assessment & Plan:  The patient's preventative maintenance and recommended screening tests for an annual wellness exam were reviewed in full today. Brought up to date unless services declined.  Counselled on the importance of diet, exercise, and its role in overall health and mortality. The patient's FH and SH was reviewed, including their home life, tobacco status, and drug and alcohol status.   Vaccines:uptodate with PNAWill get flu at work. Nonsmoker  Mammo: nml in 09/2017, repeat q 1 years DVE/PAP: Sees Dr. Nori Riis once a year and Dr. Alycia Rossetti once a year.  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 SE of constipation. Stopped Fosamax after surgery.Repeat DXA 12/2015 NORMAL on no med. HCV neg in 2014.

## 2018-04-25 NOTE — Assessment & Plan Note (Signed)
Appear to be combination of left carpal tunnel and left MCP joint arthritis. Pt has thumb spica splint.. Will wear at night for 2 weeks and try NSAID.. If not improving follow up with ortho.

## 2018-04-28 ENCOUNTER — Other Ambulatory Visit: Payer: Self-pay | Admitting: Family Medicine

## 2018-04-28 MED FILL — LOSARTAN-HCTZ 100-25 MG TAB: 100-25 | 90 days supply | Qty: 90 | Fill #0

## 2018-04-29 ENCOUNTER — Telehealth: Payer: Self-pay | Admitting: *Deleted

## 2018-04-29 NOTE — Telephone Encounter (Signed)
Returned the patient's call and scheduled an appt for 11/26 at 1:15pm.

## 2018-05-05 MED FILL — VENLAFAXINE HCL ER 75 MG CA: 75 | 30 days supply | Qty: 30 | Fill #7

## 2018-05-08 DIAGNOSIS — Z96641 Presence of right artificial hip joint: Secondary | ICD-10-CM | POA: Diagnosis not present

## 2018-05-08 DIAGNOSIS — G5602 Carpal tunnel syndrome, left upper limb: Secondary | ICD-10-CM | POA: Diagnosis not present

## 2018-05-08 MED FILL — predniSONE 5 MG TABS: 5 | 6 days supply | Qty: 21 | Fill #0

## 2018-05-20 DIAGNOSIS — G5602 Carpal tunnel syndrome, left upper limb: Secondary | ICD-10-CM | POA: Diagnosis not present

## 2018-05-20 DIAGNOSIS — M1812 Unilateral primary osteoarthritis of first carpometacarpal joint, left hand: Secondary | ICD-10-CM | POA: Diagnosis not present

## 2018-05-20 MED FILL — MELOXICAM 15 MG TABLET: 15 | 30 days supply | Qty: 30 | Fill #0

## 2018-06-05 MED FILL — VENLAFAXINE HCL ER 75 MG CA: 75 | 30 days supply | Qty: 30 | Fill #8

## 2018-06-24 DIAGNOSIS — M1812 Unilateral primary osteoarthritis of first carpometacarpal joint, left hand: Secondary | ICD-10-CM | POA: Diagnosis not present

## 2018-07-01 ENCOUNTER — Encounter: Payer: Self-pay | Admitting: Gynecology

## 2018-07-01 ENCOUNTER — Inpatient Hospital Stay: Payer: 59 | Attending: Gynecology | Admitting: Gynecology

## 2018-07-01 ENCOUNTER — Other Ambulatory Visit: Payer: Self-pay | Admitting: Family Medicine

## 2018-07-01 VITALS — BP 132/66 | HR 100 | Temp 98.3°F | Resp 20 | Ht 67.75 in | Wt 155.6 lb

## 2018-07-01 DIAGNOSIS — Z90710 Acquired absence of both cervix and uterus: Secondary | ICD-10-CM | POA: Insufficient documentation

## 2018-07-01 DIAGNOSIS — Z90722 Acquired absence of ovaries, bilateral: Secondary | ICD-10-CM | POA: Insufficient documentation

## 2018-07-01 DIAGNOSIS — C541 Malignant neoplasm of endometrium: Secondary | ICD-10-CM | POA: Diagnosis not present

## 2018-07-01 MED FILL — ATORVASTATIN 10 MG TABLET: 10 | 90 days supply | Qty: 90 | Fill #0

## 2018-07-01 NOTE — Patient Instructions (Signed)
Follow up with Dr. Nori Riis for your gynecologic care.  Recommendation for every five year colonoscopy.  Please call for any needs. No follow up necessary at this time with Dr. C-P.

## 2018-07-01 NOTE — Progress Notes (Signed)
Consult Note: Gyn-Onc  Christina Griffith 67 y.o. female  CC:  Chief Complaint  Patient presents with  . Endometrial cancer [facility])    HPI: Christina Griffith is a 67 y.o. year old initially seen in consultation for grade 1 endometrial cancer She then underwent a RTLH BSO BPLND on 11/05/57 without complications. Her final pathologic diagnosis is a Stage 1A Grade 1 endometrioid endometrial cancer with no lymphovascular space invasion, no myometrial invasion and negative lymph nodes.  Interval History:  Patient returns today as previously scheduled.  Since her last visit she is seen Dr. Dory Horn approximately 6 months ago.  In the interval she has had no new gynecologic symptoms.  She pacifically denies any GI, GU, or pelvic symptoms.  Her functional status is excellent.  She is up-to-date with mammograms and colonoscopy.  Review of Systems Constitutional: Denies fever. Continued vasomotor symptoms as above Skin: No rash Cardiovascular: No chest pain, shortness of breath, or edema  Pulmonary: No cough Gastro Intestinal:  No nausea, vomiting, mild constipation. Acute symptoms as above. Genitourinary: Denies vaginal bleeding and discharge.  Musculoskeletal: No complaints Psychology: No complaints  Current Meds:  Outpatient Encounter Medications as of 07/01/2018  Medication Sig  . albuterol (PROVENTIL HFA;VENTOLIN HFA) 108 (90 Base) MCG/ACT inhaler Inhale 2 puffs into the lungs every 6 (six) hours as needed for wheezing or shortness of breath.  Marland Kitchen atorvastatin (LIPITOR) 10 MG tablet TAKE 1 TABLET BY MOUTH ONCE DAILY  . Calcium Carb-Cholecalciferol (CALCIUM 600+D) 600-800 MG-UNIT TABS Take 1 tablet by mouth daily.  . fexofenadine (ALLEGRA) 180 MG tablet Take 180 mg by mouth daily as needed for allergies or rhinitis.  Marland Kitchen ibuprofen (ADVIL,MOTRIN) 200 MG tablet Take 600 mg by mouth every 8 (eight) hours as needed.  . Lactobacillus Rhamnosus, GG, (CULTURELLE) CAPS Take 1 capsule by mouth daily.   Marland Kitchen losartan-hydrochlorothiazide (HYZAAR) 100-25 MG tablet TAKE 1 TABLET BY MOUTH DAILY.  . magnesium gluconate (MAGONATE) 500 MG tablet Take 500 mg by mouth daily.  . meloxicam (MOBIC) 15 MG tablet TAKE 1 TABLET BY MOUTH DAILY AS NEEDED WITH FOOD  . Multiple Vitamin (MULTIVITAMIN) tablet Take 1 tablet by mouth daily.  . Polyethylene Glycol 3350 (MIRALAX PO) Take 17 g by mouth as needed.   . Triamcinolone Acetonide (NASACORT ALLERGY 24HR NA) Place 1 spray into the nose as needed.   . venlafaxine XR (EFFEXOR-XR) 75 MG 24 hr capsule TAKE 1 CAPSULE BY MOUTH AT BEDTIME  . [DISCONTINUED] atorvastatin (LIPITOR) 10 MG tablet TAKE 1 TABLET BY MOUTH ONCE DAILY   No facility-administered encounter medications on file as of 07/01/2018.     Allergy: No Known Allergies  Social Hx:   Social History   Socioeconomic History  . Marital status: Single    Spouse name: Not on file  . Number of children: Not on file  . Years of education: Not on file  . Highest education level: Not on file  Occupational History  . Occupation: Counselling psychologist: [employer]  Social Needs  . Financial resource strain: Not on file  . Food insecurity:    Worry: Not on file    Inability: Not on file  . Transportation needs:    Medical: Not on file    Non-medical: Not on file  Tobacco Use  . Smoking status: Never Smoker  . Smokeless tobacco: Never Used  Substance and Sexual Activity  . Alcohol use: Yes    Alcohol/week: 3.0 standard drinks  Types: 3 Glasses of wine per week    Comment: 3 x a week  . Drug use: No  . Sexual activity: Not on file  Lifestyle  . Physical activity:    Days per week: Not on file    Minutes per session: Not on file  . Stress: Not on file  Relationships  . Social connections:    Talks on phone: Not on file    Gets together: Not on file    Attends religious service: Not on file    Active member of club or organization: Not on file    Attends meetings of clubs  or organizations: Not on file    Relationship status: Not on file  . Intimate partner violence:    Fear of current or ex partner: Not on file    Emotionally abused: Not on file    Physically abused: Not on file    Forced sexual activity: Not on file  Other Topics Concern  . Not on file  Social History Narrative   Regular exercise- yes, 5-6 days a week, yoga   Diet: healthy    Past Surgical Hx:  Past Surgical History:  Procedure Laterality Date  . BREAST SURGERY Right 1980's   breast biopsy  . COLONOSCOPY    . EYE SURGERY Bilateral 02/2015   cataracts  . HYSTEROSCOPY W/D&C Left 1/24/204   w/ resection of Endometrial polyps  . ROBOTIC ASSISTED TOTAL HYSTERECTOMY WITH BILATERAL SALPINGO OOPHERECTOMY N/A 10/07/2012   Procedure: ROBOTIC ASSISTED TOTAL HYSTERECTOMY WITH BILATERAL SALPINGO OOPHORECTOMY/POSSIBLE LYMPH NODE BIOPSY;  Surgeon: Imagene Gurney A. Alycia Rossetti, MD;  Location: [facility];  Service: Gynecology;  Laterality: N/A;  . TOTAL HIP ARTHROPLASTY     right 2002 left 2004  . TOTAL HIP REVISION Right 05/09/2015   Procedure: TOTAL HIP REVISION;  Surgeon: Frederik Pear, MD;  Location: [place];  Service: Orthopedics;  Laterality: Right;    Past Medical Hx:  Past Medical History:  Diagnosis Date  . Arthritis    spinal stenosis--back pain--hx of herniated cervical disk- but no surgery  . Asthma   . Cold 10/03/12   pt getting over a cold--still has some head congestion, slight cough  . Colon polyps   . Endometrial ca ([place])   . H/O: hematuria    benign essential hematuria - per urology work up   . History of depression   . History of pneumonia   . Hypertension   . Numbness in right leg    pt. states occassional in right leg and right big toe from disc herniations  . PONV (postoperative nausea and vomiting)    with prior hip replacement  . Stress incontinence   . Wears glasses     Oncology Hx:    History of endometrial cancer   10/07/2012 Surgery    RTH/BSO adn BPLND. IA grade 1      Family Hx:  Family History  Problem Relation Age of Onset  . Hypertension Mother   . Cancer Father        bladder and prostate  . Hyperlipidemia Father   . Cancer Maternal Grandmother        colon  . Breast cancer Cousin     Vitals:  Blood pressure 132/66, pulse 100, temperature 98.3 F (36.8 C), temperature source Oral, resp. rate 20, height 5' 7.75" (1.721 m), weight 155 lb 9.6 oz (70.6 kg), SpO2 100 %.  Physical Exam: Well-nourished well-developed female in no acute distress.  Neck: Supple, no lymphadenopathy no  thyromegaly.   Lungs: Clear to auscultation.  Cardiovascular: Regular rate and rhythm.  Abdomen: Well-healed incisions. Abdomen is soft, nontender, nondistended. There are no palpable masses or hepatosplenomegaly.  Extremities: No edema.  Groins: No lymphadenopathy.  Pelvic: Normal external female genitalia. Vagina slightly atrophic. There are no visible lesions. Bimanual examination reveals no masses or nodularity.   Assessment/Plan: 67 y.o. year old with Stage IA Grade 1 endometrioid endometrial cancer status post definitive surgery March 2014.   She exceeds 5 years since initial therapy and is doing well.  At this juncture we will release her from follow-up with GYN oncology and suggest she have her annual exam with Dr. Nori Riis.   Marti Sleigh, MD 07/01/2018, 2:46 PM

## 2018-07-07 MED FILL — VENLAFAXINE HCL ER 75 MG CA: 75 | 30 days supply | Qty: 30 | Fill #9

## 2018-07-28 MED FILL — LOSARTAN-HCTZ 100-25 MG TAB: 100-25 | 90 days supply | Qty: 90 | Fill #1

## 2018-08-07 MED FILL — VENLAFAXINE HCL ER 75 MG CA: 75 | 30 days supply | Qty: 30 | Fill #10

## 2018-09-08 MED FILL — MELOXICAM 15 MG TABLET: 15 | 30 days supply | Qty: 30 | Fill #1

## 2018-09-08 MED FILL — VENLAFAXINE HCL ER 75 MG CA: 75 | 30 days supply | Qty: 30 | Fill #11

## 2018-09-25 DIAGNOSIS — Z803 Family history of malignant neoplasm of breast: Secondary | ICD-10-CM | POA: Diagnosis not present

## 2018-09-25 DIAGNOSIS — Z1231 Encounter for screening mammogram for malignant neoplasm of breast: Secondary | ICD-10-CM | POA: Diagnosis not present

## 2018-09-25 LAB — HM MAMMOGRAPHY

## 2018-09-25 MED FILL — ATORVASTATIN 10 MG TABLET: 10 | 90 days supply | Qty: 90 | Fill #1 | Status: TO

## 2018-10-01 ENCOUNTER — Encounter: Payer: Self-pay | Admitting: Family Medicine

## 2018-10-06 MED FILL — VENLAFAXINE HCL ER 75 MG CA: 75 | 30 days supply | Qty: 30 | Fill #12

## 2018-10-20 ENCOUNTER — Telehealth: Payer: Self-pay | Admitting: *Deleted

## 2018-10-20 ENCOUNTER — Other Ambulatory Visit: Payer: Self-pay | Admitting: Gynecologic Oncology

## 2018-10-20 DIAGNOSIS — C541 Malignant neoplasm of endometrium: Secondary | ICD-10-CM

## 2018-10-20 MED ORDER — VENLAFAXINE HCL ER 75 MG PO CP24: 75.0000 mg | ORAL_CAPSULE | Freq: Every day | ORAL | 3 refills | Status: DC

## 2018-10-20 NOTE — Telephone Encounter (Signed)
Patient called and requested a medicine refill on her effexor, patient scheduled to see Dr. Nori Riis on May 19th. [facility] refilled the medicine and I called the patient

## 2018-10-21 DIAGNOSIS — R2232 Localized swelling, mass and lump, left upper limb: Secondary | ICD-10-CM | POA: Diagnosis not present

## 2018-10-21 DIAGNOSIS — G5602 Carpal tunnel syndrome, left upper limb: Secondary | ICD-10-CM | POA: Diagnosis not present

## 2018-10-24 ENCOUNTER — Other Ambulatory Visit: Payer: Self-pay | Admitting: Orthopedic Surgery

## 2018-10-24 ENCOUNTER — Other Ambulatory Visit (HOSPITAL_COMMUNITY): Payer: Self-pay | Admitting: Orthopedic Surgery

## 2018-10-24 DIAGNOSIS — R2232 Localized swelling, mass and lump, left upper limb: Secondary | ICD-10-CM

## 2018-10-24 DIAGNOSIS — M25532 Pain in left wrist: Secondary | ICD-10-CM

## 2018-10-29 MED FILL — LOSARTAN-HCTZ 100-25 MG TAB: 100-25 | 90 days supply | Qty: 90 | Fill #0 | Status: TO

## 2018-10-30 MED FILL — VENLAFAXINE HCL ER 75 MG CA: 75 | 30 days supply | Qty: 30 | Fill #0

## 2018-11-27 MED FILL — VENLAFAXINE HCL ER 75 MG CA: 75 | 30 days supply | Qty: 30 | Fill #1 | Status: TO

## 2018-12-01 ENCOUNTER — Ambulatory Visit (HOSPITAL_COMMUNITY): Payer: 59

## 2018-12-01 ENCOUNTER — Encounter (HOSPITAL_COMMUNITY): Payer: Self-pay

## 2018-12-10 ENCOUNTER — Ambulatory Visit (HOSPITAL_COMMUNITY)
Admission: RE | Admit: 2018-12-10 | Discharge: 2018-12-10 | Disposition: A | Discharge status: Home / Self Care | Payer: 59 | Source: Ambulatory Visit | Attending: Orthopedic Surgery | Admitting: Orthopedic Surgery

## 2018-12-10 ENCOUNTER — Other Ambulatory Visit: Payer: Self-pay

## 2018-12-10 DIAGNOSIS — R2232 Localized swelling, mass and lump, left upper limb: Secondary | ICD-10-CM | POA: Insufficient documentation

## 2018-12-10 DIAGNOSIS — M25532 Pain in left wrist: Secondary | ICD-10-CM | POA: Diagnosis not present

## 2018-12-10 LAB — POCT I-STAT CREATININE: Creatinine, Ser: 0.8 mg/dL (ref 0.44–1.00)

## 2018-12-10 MED ORDER — GADOBUTROL 1 MMOL/ML IV SOLN
7.0000 mL | Freq: Once | INTRAVENOUS | Status: AC | PRN
  Administered 2018-12-10: 7 mL via INTRAVENOUS

## 2018-12-11 MED FILL — CLINDAMYCIN HCL 300 MG CAPS: 300 | 7 days supply | Qty: 21 | Fill #0

## 2018-12-22 DIAGNOSIS — G5602 Carpal tunnel syndrome, left upper limb: Secondary | ICD-10-CM | POA: Diagnosis not present

## 2018-12-22 MED FILL — ATORVASTATIN 10 MG TABLET: 10 | 90 days supply | Qty: 90 | Fill #0

## 2018-12-23 DIAGNOSIS — B373 Candidiasis of vulva and vagina: Secondary | ICD-10-CM | POA: Diagnosis not present

## 2018-12-23 DIAGNOSIS — Z6824 Body mass index (BMI) 24.0-24.9, adult: Secondary | ICD-10-CM | POA: Diagnosis not present

## 2018-12-23 DIAGNOSIS — Z01419 Encounter for gynecological examination (general) (routine) without abnormal findings: Secondary | ICD-10-CM | POA: Diagnosis not present

## 2018-12-23 DIAGNOSIS — N760 Acute vaginitis: Secondary | ICD-10-CM | POA: Diagnosis not present

## 2018-12-23 MED FILL — FLUCONAZOLE 150 MG TABS: 150 | 6 days supply | Qty: 3 | Fill #0

## 2018-12-25 DIAGNOSIS — M67431 Ganglion, right wrist: Secondary | ICD-10-CM | POA: Diagnosis not present

## 2018-12-31 MED FILL — VENLAFAXINE HCL ER 75 MG CA: 75 | 30 days supply | Qty: 30 | Fill #0

## 2019-01-09 ENCOUNTER — Other Ambulatory Visit: Payer: Self-pay

## 2019-01-09 DIAGNOSIS — G5602 Carpal tunnel syndrome, left upper limb: Secondary | ICD-10-CM | POA: Diagnosis not present

## 2019-01-09 DIAGNOSIS — M67432 Ganglion, left wrist: Secondary | ICD-10-CM | POA: Diagnosis not present

## 2019-01-09 DIAGNOSIS — M6588 Other synovitis and tenosynovitis, other site: Secondary | ICD-10-CM | POA: Diagnosis not present

## 2019-01-09 DIAGNOSIS — M67832 Other specified disorders of synovium, left wrist: Secondary | ICD-10-CM | POA: Diagnosis not present

## 2019-01-09 MED FILL — oxyCODONE HCL 5 MG TABS: 5 | 3 days supply | Qty: 10 | Fill #0

## 2019-01-09 MED FILL — LOSARTAN-HCTZ 100-25 MG TAB: 100-25 | 90 days supply | Qty: 90 | Fill #0

## 2019-01-20 MED FILL — MELOXICAM 15 MG TABLET: 15 | 30 days supply | Qty: 30 | Fill #0

## 2019-02-02 MED FILL — VENLAFAXINE HCL ER 75 MG CA: 75 | 30 days supply | Qty: 30 | Fill #1

## 2019-03-03 MED FILL — VENLAFAXINE HCL ER 75 MG CA: 75 | 90 days supply | Qty: 90 | Fill #0

## 2019-03-24 MED FILL — ATORVASTATIN 10 MG TABLET: 10 | 90 days supply | Qty: 90 | Fill #0

## 2019-03-26 ENCOUNTER — Telehealth: Payer: Self-pay | Admitting: Family Medicine

## 2019-03-26 NOTE — Telephone Encounter (Signed)
Left message asking pt to call office due to schedule change her appointment times has changed see below  Please let pt know her on 9/18 her appointment time changed to 9:45 this will be a phone visit @ [phone]  Her appointment on 05/01/2019 time has changed to 8:40.  Please see if pt would like to schedule labs prior to her 9/25 appointment or have labs done same day

## 2019-04-22 ENCOUNTER — Telehealth: Payer: Self-pay | Admitting: Family Medicine

## 2019-04-22 DIAGNOSIS — E7800 Pure hypercholesterolemia, unspecified: Secondary | ICD-10-CM

## 2019-04-22 NOTE — Telephone Encounter (Signed)
-----   Message from Cloyd Stagers, RT sent at 04/14/2019  1:52 PM EDT ----- Regarding: Lab Orders for Thursday 9.17.2020 Please place lab orders for Thursday 9.17.2020, office visit for physical on Friday 9.25.2020 Thank you, Dyke Maes RT(R)

## 2019-04-23 ENCOUNTER — Other Ambulatory Visit (INDEPENDENT_AMBULATORY_CARE_PROVIDER_SITE_OTHER): Payer: 59

## 2019-04-23 DIAGNOSIS — E7800 Pure hypercholesterolemia, unspecified: Secondary | ICD-10-CM

## 2019-04-23 LAB — COMPREHENSIVE METABOLIC PANEL
ALT: 25 U/L (ref 0–35)
AST: 33 U/L (ref 0–37)
Albumin: 4.6 g/dL (ref 3.5–5.2)
Alkaline Phosphatase: 68 U/L (ref 39–117)
BUN: 19 mg/dL (ref 6–23)
CO2: 33 mEq/L — ABNORMAL HIGH (ref 19–32)
Calcium: 10.2 mg/dL (ref 8.4–10.5)
Chloride: 101 mEq/L (ref 96–112)
Creatinine, Ser: 0.75 mg/dL (ref 0.40–1.20)
GFR: 76.52 mL/min (ref >60.00)
Glucose, Bld: 94 mg/dL (ref 70–99)
Potassium: 4.1 mEq/L (ref 3.5–5.1)
Sodium: 140 mEq/L (ref 135–145)
Total Bilirubin: 0.4 mg/dL (ref 0.2–1.2)
Total Protein: 7.5 g/dL (ref 6.0–8.3)

## 2019-04-23 LAB — LIPID PANEL
Cholesterol: 178 mg/dL (ref 0–200)
HDL: 74.4 mg/dL (ref >39.00)
LDL Cholesterol: 79 mg/dL (ref 0–99)
NonHDL: 103.45
Total CHOL/HDL Ratio: 2
Triglycerides: 121 mg/dL (ref 0.0–149.0)
VLDL: 24.2 mg/dL (ref 0.0–40.0)

## 2019-04-24 ENCOUNTER — Ambulatory Visit (INDEPENDENT_AMBULATORY_CARE_PROVIDER_SITE_OTHER): Payer: 59

## 2019-04-24 ENCOUNTER — Ambulatory Visit: Payer: 59

## 2019-04-24 DIAGNOSIS — Z0000 Encounter for general adult medical examination without abnormal findings: Secondary | ICD-10-CM

## 2019-04-24 NOTE — Progress Notes (Signed)
PCP notes: none  Health Maintenance: Patient wants to get a Tdap and Shingrix vaccine if her private [employer]) insurance will cover these.     Abnormal Screenings: none    Patient concerns: none    Nurse concerns: none    Next PCP appt.: 05/01/2019 @ 8:40 am

## 2019-04-24 NOTE — Patient Instructions (Signed)
Ms. Christina Griffith , Thank you for taking time to come for your Medicare Wellness Visit. I appreciate your ongoing commitment to your health goals. Please review the following plan we discussed and let me know if I can assist you in the future.   Screening recommendations/referrals: Colonoscopy: up to date, completed 08/05/2015 Mammogram: up to date, completed 09/25/2018 Bone Density: up to date, completed 12/06/2015 Recommended yearly ophthalmology/optometry visit for glaucoma screening and checkup Recommended yearly dental visit for hygiene and checkup  Vaccinations: Influenza vaccine: up to date, completed 04/17/2019 per patient Pneumococcal vaccine: series completed Tdap vaccine: will check with private insurance to see if covered Shingles vaccine: will check with private insurance to see if covered   Advanced directives: on file   Conditions/risks identified: hypercholesterolemia   Next appointment: 05/01/2019 @ 8:40 am    Preventive Care 38 Years and Older, Female Preventive care refers to lifestyle choices and visits with your health care provider that can promote health and wellness. What does preventive care include?  A yearly physical exam. This is also called an annual well check.  Dental exams once or twice a year.  Routine eye exams. Ask your health care provider how often you should have your eyes checked.  Personal lifestyle choices, including:  Daily care of your teeth and gums.  Regular physical activity.  Eating a healthy diet.  Avoiding tobacco and drug use.  Limiting alcohol use.  Practicing safe sex.  Taking low-dose aspirin every day.  Taking vitamin and mineral supplements as recommended by your health care provider. What happens during an annual well check? The services and screenings done by your health care provider during your annual well check will depend on your age, overall health, lifestyle risk factors, and family history of disease. Counseling   Your health care provider may ask you questions about your:  Alcohol use.  Tobacco use.  Drug use.  Emotional well-being.  Home and relationship well-being.  Sexual activity.  Eating habits.  History of falls.  Memory and ability to understand (cognition).  Work and work Statistician.  Reproductive health. Screening  You may have the following tests or measurements:  Height, weight, and BMI.  Blood pressure.  Lipid and cholesterol levels. These may be checked every 5 years, or more frequently if you are over 88 years old.  Skin check.  Lung cancer screening. You may have this screening every year starting at age 5 if you have a 30-pack-year history of smoking and currently smoke or have quit within the past 15 years.  Fecal occult blood test (FOBT) of the stool. You may have this test every year starting at age 42.  Flexible sigmoidoscopy or colonoscopy. You may have a sigmoidoscopy every 5 years or a colonoscopy every 10 years starting at age 81.  Hepatitis C blood test.  Hepatitis B blood test.  Sexually transmitted disease (STD) testing.  Diabetes screening. This is done by checking your blood sugar (glucose) after you have not eaten for a while (fasting). You may have this done every 1-3 years.  Bone density scan. This is done to screen for osteoporosis. You may have this done starting at age 72.  Mammogram. This may be done every 1-2 years. Talk to your health care provider about how often you should have regular mammograms. Talk with your health care provider about your test results, treatment options, and if necessary, the need for more tests. Vaccines  Your health care provider may recommend certain vaccines, such as:  Influenza  vaccine. This is recommended every year.  Tetanus, diphtheria, and acellular pertussis (Tdap, Td) vaccine. You may need a Td booster every 10 years.  Zoster vaccine. You may need this after age 53.  Pneumococcal 13-valent  conjugate (PCV13) vaccine. One dose is recommended after age 8.  Pneumococcal polysaccharide (PPSV23) vaccine. One dose is recommended after age 48. Talk to your health care provider about which screenings and vaccines you need and how often you need them. This information is not intended to replace advice given to you by your health care provider. Make sure you discuss any questions you have with your health care provider. Document Released: 08/19/2015 Document Revised: 04/11/2016 Document Reviewed: 05/24/2015 Elsevier Interactive Patient Education  2017 [place] Prevention in the Home Falls can cause injuries. They can happen to people of all ages. There are many things you can do to make your home safe and to help prevent falls. What can I do on the outside of my home?  Regularly fix the edges of walkways and driveways and fix any cracks.  Remove anything that might make you trip as you walk through a door, such as a raised step or threshold.  Trim any bushes or trees on the path to your home.  Use bright outdoor lighting.  Clear any walking paths of anything that might make someone trip, such as rocks or tools.  Regularly check to see if handrails are loose or broken. Make sure that both sides of any steps have handrails.  Any raised decks and porches should have guardrails on the edges.  Have any leaves, snow, or ice cleared regularly.  Use sand or salt on walking paths during winter.  Clean up any spills in your garage right away. This includes oil or grease spills. What can I do in the bathroom?  Use night lights.  Install grab bars by the toilet and in the tub and shower. Do not use towel bars as grab bars.  Use non-skid mats or decals in the tub or shower.  If you need to sit down in the shower, use a plastic, non-slip stool.  Keep the floor dry. Clean up any water that spills on the floor as soon as it happens.  Remove soap buildup in the tub or  shower regularly.  Attach bath mats securely with double-sided non-slip rug tape.  Do not have throw rugs and other things on the floor that can make you trip. What can I do in the bedroom?  Use night lights.  Make sure that you have a light by your bed that is easy to reach.  Do not use any sheets or blankets that are too big for your bed. They should not hang down onto the floor.  Have a firm chair that has side arms. You can use this for support while you get dressed.  Do not have throw rugs and other things on the floor that can make you trip. What can I do in the kitchen?  Clean up any spills right away.  Avoid walking on wet floors.  Keep items that you use a lot in easy-to-reach places.  If you need to reach something above you, use a strong step stool that has a grab bar.  Keep electrical cords out of the way.  Do not use floor polish or wax that makes floors slippery. If you must use wax, use non-skid floor wax.  Do not have throw rugs and other things on the floor that can  make you trip. What can I do with my stairs?  Do not leave any items on the stairs.  Make sure that there are handrails on both sides of the stairs and use them. Fix handrails that are broken or loose. Make sure that handrails are as long as the stairways.  Check any carpeting to make sure that it is firmly attached to the stairs. Fix any carpet that is loose or worn.  Avoid having throw rugs at the top or bottom of the stairs. If you do have throw rugs, attach them to the floor with carpet tape.  Make sure that you have a light switch at the top of the stairs and the bottom of the stairs. If you do not have them, ask someone to add them for you. What else can I do to help prevent falls?  Wear shoes that:  Do not have high heels.  Have rubber bottoms.  Are comfortable and fit you well.  Are closed at the toe. Do not wear sandals.  If you use a stepladder:  Make sure that it is fully  opened. Do not climb a closed stepladder.  Make sure that both sides of the stepladder are locked into place.  Ask someone to hold it for you, if possible.  Clearly mark and make sure that you can see:  Any grab bars or handrails.  First and last steps.  Where the edge of each step is.  Use tools that help you move around (mobility aids) if they are needed. These include:  Canes.  Walkers.  Scooters.  Crutches.  Turn on the lights when you go into a dark area. Replace any light bulbs as soon as they burn out.  Set up your furniture so you have a clear path. Avoid moving your furniture around.  If any of your floors are uneven, fix them.  If there are any pets around you, be aware of where they are.  Review your medicines with your doctor. Some medicines can make you feel dizzy. This can increase your chance of falling. Ask your doctor what other things that you can do to help prevent falls. This information is not intended to replace advice given to you by your health care provider. Make sure you discuss any questions you have with your health care provider. Document Released: 05/19/2009 Document Revised: 12/29/2015 Document Reviewed: 08/27/2014 Elsevier Interactive Patient Education  2017 [employer].

## 2019-04-24 NOTE — Progress Notes (Signed)
Subjective:   Christina Griffith is a 69 y.o. female who presents for Medicare Annual (Subsequent) preventive examination.  Review of Systems:    This visit is being conducted through telemedicine due to the COVID-19 pandemic. This patient has given me verbal consent via doximity to conduct this visit, patient states they are participating from their home address. Some vital signs may be absent or patient reported.    Patient identification: identified by name, DOB, and current address.  Cardiac Risk Factors include: advanced age (>34men, >68 women);dyslipidemia     Objective:     Vitals: There were no vitals taken for this visit.  There is no height or weight on file to calculate BMI.  Advanced Directives 04/24/2019 07/01/2018 04/18/2018 07/03/2017 04/05/2017 04/03/2016 05/09/2015  Does Patient Have a Medical Advance Directive? Yes Yes Yes Yes Yes Yes Yes  Type of Paramedic of [place];Living will Living will [place];Living will Living will [place];Living will [place];Living will Living will  Does patient want to make changes to medical advance directive? - No - Patient declined - - No - Patient declined No - Patient declined -  Copy of [place] in Chart? Yes - validated most recent copy scanned in chart (See row information) - Yes Yes Yes Yes Yes  Pre-existing out of facility DNR order (yellow form or pink MOST form) - - - - - - -    Tobacco Social History   Tobacco Use  Smoking Status Never Smoker  Smokeless Tobacco Never Used     Counseling given: Not Answered   Clinical Intake:  Pre-visit preparation completed: Yes  Pain : No/denies pain     Nutritional Risks: None Diabetes: No  How often do you need to have someone help you when you read instructions, pamphlets, or other written materials from your doctor or pharmacy?: 1 - Never What is the last grade level  you completed in school?: 2 years gradute college  Interpreter Needed?: No  Information entered by :: CJohnson, LPN  Past Medical History:  Diagnosis Date  . Arthritis    spinal stenosis--back pain--hx of herniated cervical disk- but no surgery  . Asthma   . Cold 10/03/12   pt getting over a cold--still has some head congestion, slight cough  . Colon polyps   . Endometrial ca ([place])   . H/O: hematuria    benign essential hematuria - per urology work up   . History of depression   . History of pneumonia   . Hypertension   . Numbness in right leg    pt. states occassional in right leg and right big toe from disc herniations  . PONV (postoperative nausea and vomiting)    with prior hip replacement  . Stress incontinence   . Wears glasses    Past Surgical History:  Procedure Laterality Date  . BREAST SURGERY Right 1980's   breast biopsy  . COLONOSCOPY    . EYE SURGERY Bilateral 02/2015   cataracts  . HYSTEROSCOPY W/D&C Left 1/24/204   w/ resection of Endometrial polyps  . ROBOTIC ASSISTED TOTAL HYSTERECTOMY WITH BILATERAL SALPINGO OOPHERECTOMY N/A 10/07/2012   Procedure: ROBOTIC ASSISTED TOTAL HYSTERECTOMY WITH BILATERAL SALPINGO OOPHORECTOMY/POSSIBLE LYMPH NODE BIOPSY;  Surgeon: Imagene Gurney A. Alycia Rossetti, MD;  Location: [facility];  Service: Gynecology;  Laterality: N/A;  . TOTAL HIP ARTHROPLASTY     right 2002 left 2004  . TOTAL HIP REVISION Right 05/09/2015  Procedure: TOTAL HIP REVISION;  Surgeon: Frederik Pear, MD;  Location: [facility];  Service: Orthopedics;  Laterality: Right;   Family History  Problem Relation Age of Onset  . Hypertension Mother   . Cancer Father        bladder and prostate  . Hyperlipidemia Father   . Cancer Maternal Grandmother        colon  . Breast cancer Cousin    Social History   Socioeconomic History  . Marital status: Single    Spouse name: Not on file  . Number of children: Not on file  . Years of education: Not on file  . Highest education level: Not  on file  Occupational History  . Occupation: Counselling psychologist: [employer]  Social Needs  . Financial resource strain: Not hard at all  . Food insecurity    Worry: Never true    Inability: Never true  . Transportation needs    Medical: No    Non-medical: No  Tobacco Use  . Smoking status: Never Smoker  . Smokeless tobacco: Never Used  Substance and Sexual Activity  . Alcohol use: Yes    Alcohol/week: 3.0 standard drinks    Types: 3 Glasses of wine per week    Comment: 3 x a week  . Drug use: No  . Sexual activity: Not on file  Lifestyle  . Physical activity    Days per week: 7 days    Minutes per session: 60 min  . Stress: Only a little  Relationships  . Social Herbalist on phone: Not on file    Gets together: Not on file    Attends religious service: Not on file    Active member of club or organization: Not on file    Attends meetings of clubs or organizations: Not on file    Relationship status: Not on file  Other Topics Concern  . Not on file  Social History Narrative   Regular exercise- yes, 5-6 days a week, yoga   Diet: healthy    Outpatient Encounter Medications as of 04/24/2019  Medication Sig  . albuterol (PROVENTIL HFA;VENTOLIN HFA) 108 (90 Base) MCG/ACT inhaler Inhale 2 puffs into the lungs every 6 (six) hours as needed for wheezing or shortness of breath.  Marland Kitchen atorvastatin (LIPITOR) 10 MG tablet TAKE 1 TABLET BY MOUTH ONCE DAILY  . Calcium Carb-Cholecalciferol (CALCIUM 600+D) 600-800 MG-UNIT TABS Take 1 tablet by mouth daily.  . fexofenadine (ALLEGRA) 180 MG tablet Take 180 mg by mouth daily as needed for allergies or rhinitis.  Marland Kitchen ibuprofen (ADVIL,MOTRIN) 200 MG tablet Take 600 mg by mouth every 8 (eight) hours as needed.  . Lactobacillus Rhamnosus, GG, (CULTURELLE) CAPS Take 1 capsule by mouth daily.  Marland Kitchen losartan-hydrochlorothiazide (HYZAAR) 100-25 MG tablet TAKE 1 TABLET BY MOUTH DAILY.  . magnesium gluconate (MAGONATE) 500  MG tablet Take 500 mg by mouth daily.  . meloxicam (MOBIC) 15 MG tablet TAKE 1 TABLET BY MOUTH DAILY AS NEEDED WITH FOOD  . Multiple Vitamin (MULTIVITAMIN) tablet Take 1 tablet by mouth daily.  . Polyethylene Glycol 3350 (MIRALAX PO) Take 17 g by mouth as needed.   . Triamcinolone Acetonide (NASACORT ALLERGY 24HR NA) Place 1 spray into the nose as needed.   . venlafaxine XR (EFFEXOR-XR) 75 MG 24 hr capsule Take 1 capsule (75 mg total) by mouth at bedtime.   No facility-administered encounter medications on file as of 04/24/2019.  Activities of Daily Living In your present state of health, do you have any difficulty performing the following activities: 04/24/2019  Hearing? Y  Comment "roaring" sound in ears sometimes  Vision? N  Difficulty concentrating or making decisions? N  Walking or climbing stairs? N  Dressing or bathing? N  Doing errands, shopping? N  Preparing Food and eating ? N  Using the Toilet? N  In the past six months, have you accidently leaked urine? Y  Comment stress incontinence, wears a pad  Do you have problems with loss of bowel control? N  Managing your Medications? N  Managing your Finances? N  Housekeeping or managing your Housekeeping? N  Some recent data might be hidden    Patient Care Team: Jinny Sanders, MD as PCP - General (Family Medicine)    Assessment:   This is a routine wellness examination for [place].  Exercise Activities and Dietary recommendations Current Exercise Habits: Home exercise routine, Type of exercise: walking, Time (Minutes): 60, Frequency (Times/Week): 7, Weekly Exercise (Minutes/Week): 420, Intensity: Mild, Exercise limited by: orthopedic condition(s)  Goals    . Increase physical activity     Starting 04/18/2018, I will continue to walk at least 60 minutes daily.     . Patient Stated     04/24/2019, Patient wants to improve her diet and eat better.       Fall Risk Fall Risk  04/24/2019 04/18/2018 04/05/2017 04/03/2016  04/03/2016  Falls in the past year? 0 No No Yes Yes  Number falls in past yr: 0 - - 1 1  Injury with Fall? - - - No No  Risk for fall due to : Medication side effect - - - -  Follow up Falls evaluation completed;Falls prevention discussed - - Falls prevention discussed -   Is the patient's home free of loose throw rugs in walkways, pet beds, electrical cords, etc?   yes      Grab bars in the bathroom? no      Handrails on the stairs?   yes      Adequate lighting?   yes  Timed Get Up and Go performed: n/a  Depression Screen PHQ 2/9 Scores 04/24/2019 04/18/2018 04/05/2017 04/03/2016  PHQ - 2 Score 0 0 0 0  PHQ- 9 Score 0 0 0 -     Cognitive Function MMSE - Mini Mental State Exam 04/24/2019 04/18/2018 04/05/2017  Orientation to time 5 5 5   Orientation to Place 5 5 5   Registration 3 3 3   Attention/ Calculation 5 0 0  Recall 3 3 3   Language- name 2 objects - 0 0  Language- repeat 1 1 1   Language- follow 3 step command - 3 3  Language- read & follow direction - 0 0  Write a sentence - 0 0  Copy design - 0 0  Total score - 20 20  Mini Cog  Mini-Cog screen was completed. Maximum score is 22. A value of 0 denotes this part of the MMSE was not completed or the patient failed this part of the Mini-Cog screening.      Immunization History  Administered Date(s) Administered  . Influenza Whole 06/06/2010  . Influenza,inj,Quad PF,6+ Mos 04/15/2015, 04/30/2016, 04/18/2018  . Pneumococcal Conjugate-13 03/16/2014  . Pneumococcal Polysaccharide-23 12/14/2011, 04/05/2017  . Td 08/29/2007  . Zoster 11/24/2010    Qualifies for Shingles Vaccine?yes  Screening Tests Health Maintenance  Topic Date Due  . TETANUS/TDAP  08/06/2019 (Originally 08/28/2017)  . MAMMOGRAM  09/26/2019  .  COLONOSCOPY  08/04/2025  . INFLUENZA VACCINE  Completed  . DEXA SCAN  Completed  . Hepatitis C Screening  Completed  . PNA vac Low Risk Adult  Completed    Cancer Screenings: Lung: Low Dose CT Chest  recommended if Age 23-80 years, 30 pack-year currently smoking OR have quit w/in 15years. Patient does not qualify. Breast:  Up to date on Mammogram? Yes, completed 09/25/2018  Up to date of Bone Density/Dexa? Yes, completed 12/06/2015 Colorectal: up to date, completed 08/05/2015  Additional Screenings: : Hepatitis C Screening: 01/02/2013     Plan:   Patient wants to improve her diet and eat better.   I have personally reviewed and noted the following in the patient's chart:   . Medical and social history . Use of alcohol, tobacco or illicit drugs  . Current medications and supplements . Functional ability and status . Nutritional status . Physical activity . Advanced directives . List of other physicians . Hospitalizations, surgeries, and ER visits in previous 12 months . Vitals . Screenings to include cognitive, depression, and falls . Referrals and appointments  In addition, I have reviewed and discussed with patient certain preventive protocols, quality metrics, and best practice recommendations. A written personalized care plan for preventive services as well as general preventive health recommendations were provided to patient.     Andrez Grime, LPN  [id number]

## 2019-04-27 ENCOUNTER — Other Ambulatory Visit: Payer: Self-pay | Admitting: Family Medicine

## 2019-04-27 MED FILL — LOSARTAN-HCTZ 100-25 MG TAB: 100-25 | 90 days supply | Qty: 90 | Fill #0

## 2019-05-01 ENCOUNTER — Ambulatory Visit (INDEPENDENT_AMBULATORY_CARE_PROVIDER_SITE_OTHER): Payer: 59 | Admitting: Family Medicine

## 2019-05-01 ENCOUNTER — Other Ambulatory Visit: Payer: Self-pay

## 2019-05-01 ENCOUNTER — Encounter: Payer: 59 | Admitting: Family Medicine

## 2019-05-01 ENCOUNTER — Encounter: Payer: Self-pay | Admitting: Family Medicine

## 2019-05-01 VITALS — BP 110/60 | HR 108 | Temp 99.3°F | Ht 67.25 in | Wt 158.5 lb

## 2019-05-01 DIAGNOSIS — I10 Essential (primary) hypertension: Secondary | ICD-10-CM

## 2019-05-01 DIAGNOSIS — E7800 Pure hypercholesterolemia, unspecified: Secondary | ICD-10-CM | POA: Diagnosis not present

## 2019-05-01 DIAGNOSIS — Z0000 Encounter for general adult medical examination without abnormal findings: Secondary | ICD-10-CM

## 2019-05-01 NOTE — Patient Instructions (Signed)
Preventive Care 69 Years and Older, Female Preventive care refers to lifestyle choices and visits with your health care provider that can promote health and wellness. This includes:  A yearly physical exam. This is also called an annual well check.  Regular dental and eye exams.  Immunizations.  Screening for certain conditions.  Healthy lifestyle choices, such as diet and exercise. What can I expect for my preventive care visit? Physical exam Your health care provider will check:  Height and weight. These may be used to calculate body mass index (BMI), which is a measurement that tells if you are at a healthy weight.  Heart rate and blood pressure.  Your skin for abnormal spots. Counseling Your health care provider may ask you questions about:  Alcohol, tobacco, and drug use.  Emotional well-being.  Home and relationship well-being.  Sexual activity.  Eating habits.  History of falls.  Memory and ability to understand (cognition).  Work and work Statistician.  Pregnancy and menstrual history. What immunizations do I need?  Influenza (flu) vaccine  This is recommended every year. Tetanus, diphtheria, and pertussis (Tdap) vaccine  You may need a Td booster every 10 years. Varicella (chickenpox) vaccine  You may need this vaccine if you have not already been vaccinated. Zoster (shingles) vaccine  You may need this after age 33. Pneumococcal conjugate (PCV13) vaccine  One dose is recommended after age 33. Pneumococcal polysaccharide (PPSV23) vaccine  One dose is recommended after age 72. Measles, mumps, and rubella (MMR) vaccine  You may need at least one dose of MMR if you were born in 1957 or later. You may also need a second dose. Meningococcal conjugate (MenACWY) vaccine  You may need this if you have certain conditions. Hepatitis A vaccine  You may need this if you have certain conditions or if you travel or work in places where you may be exposed  to hepatitis A. Hepatitis B vaccine  You may need this if you have certain conditions or if you travel or work in places where you may be exposed to hepatitis B. Haemophilus influenzae type b (Hib) vaccine  You may need this if you have certain conditions. You may receive vaccines as individual doses or as more than one vaccine together in one shot (combination vaccines). Talk with your health care provider about the risks and benefits of combination vaccines. What tests do I need? Blood tests  Lipid and cholesterol levels. These may be checked every 5 years, or more frequently depending on your overall health.  Hepatitis C test.  Hepatitis B test. Screening  Lung cancer screening. You may have this screening every year starting at age 39 if you have a 30-pack-year history of smoking and currently smoke or have quit within the past 15 years.  Colorectal cancer screening. All adults should have this screening starting at age 36 and continuing until age 15. Your health care provider may recommend screening at age 23 if you are at increased risk. You will have tests every 1-10 years, depending on your results and the type of screening test.  Diabetes screening. This is done by checking your blood sugar (glucose) after you have not eaten for a while (fasting). You may have this done every 1-3 years.  Mammogram. This may be done every 1-2 years. Talk with your health care provider about how often you should have regular mammograms.  BRCA-related cancer screening. This may be done if you have a family history of breast, ovarian, tubal, or peritoneal cancers.  Other tests  Sexually transmitted disease (STD) testing.  Bone density scan. This is done to screen for osteoporosis. You may have this done starting at age 55. Follow these instructions at home: Eating and drinking  Eat a diet that includes fresh fruits and vegetables, whole grains, lean protein, and low-fat dairy products. Limit  your intake of foods with high amounts of sugar, saturated fats, and salt.  Take vitamin and mineral supplements as recommended by your health care provider.  Do not drink alcohol if your health care provider tells you not to drink.  If you drink alcohol: ? Limit how much you have to 0-1 drink a day. ? Be aware of how much alcohol is in your drink. In the [place] one drink equals one 12 oz bottle of beer (355 mL), one 5 oz glass of wine (148 mL), or one 1 oz glass of hard liquor (44 mL). Lifestyle  Take daily care of your teeth and gums.  Stay active. Exercise for at least 30 minutes on 5 or more days each week.  Do not use any products that contain nicotine or tobacco, such as cigarettes, e-cigarettes, and chewing tobacco. If you need help quitting, ask your health care provider.  If you are sexually active, practice safe sex. Use a condom or other form of protection in order to prevent STIs (sexually transmitted infections).  Talk with your health care provider about taking a low-dose aspirin or statin. What's next?  Go to your health care provider once a year for a well check visit.  Ask your health care provider how often you should have your eyes and teeth checked.  Stay up to date on all vaccines. This information is not intended to replace advice given to you by your health care provider. Make sure you discuss any questions you have with your health care provider. Document Released: 08/19/2015 Document Revised: 07/17/2018 Document Reviewed: 07/17/2018 Elsevier Patient Education  2020 [employer].

## 2019-05-01 NOTE — Assessment & Plan Note (Signed)
Well controlled. Continue current medication.  

## 2019-05-01 NOTE — Progress Notes (Signed)
Chief Complaint  Patient presents with  . Annual Exam    Part 2    History of Present Illness: HPI   The patient presents for complete physical and review of chronic health problems. He/She also has the following acute concerns today:  The patient saw a LPN or RN for medicare wellness visit.  Prevention and wellness was reviewed in detail. Note reviewed and important notes copied below.  PCP notes: none  Health Maintenance: Patient wants to get a Tdap and Shingrix vaccine if her private [facility]) insurance will cover these.   Abnormal Screenings: none  Hypertension:    Great control on losartan HCTZ BP Readings from Last 3 Encounters:  05/01/19 110/60  07/01/18 132/66  04/25/18 120/64  Using medication without problems or lightheadedness:  none Chest pain with exertion: none Edema:none Short of breath:none Average home BPs: good Other issues: Rarely using albuterol for asthma    Wt Readings from Last 3 Encounters:  05/01/19 158 lb 8 oz (71.9 kg)  07/01/18 155 lb 9.6 oz (70.6 kg)  04/25/18 155 lb 12 oz (70.6 kg)     Elevated Cholesterol:  Good control on atorvastatin Lab Results  Component Value Date   CHOL 178 04/23/2019   HDL 74.40 04/23/2019   LDLCALC 79 04/23/2019   TRIG 121.0 04/23/2019   CHOLHDL 2 04/23/2019   Using medications without problems: Muscle aches:  Diet compliance: good Exercise: walking dog daily 45-60 min, doing yoga 4 days a week Other complaints:   Hx of endometrial cancerGrade 1 endometrial cancer She then underwent a RTLH BSO BPLND on 0000000 without complications. Her final pathologic diagnosis is a Stage 1A Grade 1 endometrioid endometrial cancer with no lymphovascular space invasion, no myometrial invasion and negative lymph nodes.  GYN ONC: Dr. [place].Marland Kitchen Dismissed after year 5. GYN is Dr. Nori Riis   On effexor for menopausal syndrome.Now off estrogen.   COVID 19 screen No recent travel or known exposure to COVID19 The  patient denies respiratory symptoms of COVID 19 at this time.  The importance of social distancing was discussed today.   Review of Systems  Gastrointestinal: Positive for constipation.       Stable constipation with nmiralax.  Genitourinary:        Stable stress incontinence      Past Medical History:  Diagnosis Date  . Arthritis    spinal stenosis--back pain--hx of herniated cervical disk- but no surgery  . Asthma   . Cold 10/03/12   pt getting over a cold--still has some head congestion, slight cough  . Colon polyps   . Endometrial ca ([place])   . H/O: hematuria    benign essential hematuria - per urology work up   . History of depression   . History of pneumonia   . Hypertension   . Numbness in right leg    pt. states occassional in right leg and right big toe from disc herniations  . PONV (postoperative nausea and vomiting)    with prior hip replacement  . Stress incontinence   . Wears glasses     reports that she has never smoked. She has never used smokeless tobacco. She reports current alcohol use of about 3.0 standard drinks of alcohol per week. She reports that she does not use drugs.   Current Outpatient Medications:  .  albuterol (PROVENTIL HFA;VENTOLIN HFA) 108 (90 Base) MCG/ACT inhaler, Inhale 2 puffs into the lungs every 6 (six) hours as needed for wheezing or shortness of breath., Disp: 1  Inhaler, Rfl: 2 .  atorvastatin (LIPITOR) 10 MG tablet, TAKE 1 TABLET BY MOUTH ONCE DAILY, Disp: 90 tablet, Rfl: 3 .  Calcium Carb-Cholecalciferol (CALCIUM 600+D) 600-800 MG-UNIT TABS, Take 1 tablet by mouth daily., Disp: , Rfl:  .  fexofenadine (ALLEGRA) 180 MG tablet, Take 180 mg by mouth daily as needed for allergies or rhinitis., Disp: , Rfl:  .  ibuprofen (ADVIL,MOTRIN) 200 MG tablet, Take 600 mg by mouth every 8 (eight) hours as needed., Disp: , Rfl:  .  Lactobacillus Rhamnosus, GG, (CULTURELLE) CAPS, Take 1 capsule by mouth daily., Disp: , Rfl:  .   losartan-hydrochlorothiazide (HYZAAR) 100-25 MG tablet, TAKE 1 TABLET BY MOUTH DAILY., Disp: 90 tablet, Rfl: 0 .  magnesium gluconate (MAGONATE) 500 MG tablet, Take 500 mg by mouth daily., Disp: , Rfl:  .  meloxicam (MOBIC) 15 MG tablet, TAKE 1 TABLET BY MOUTH DAILY AS NEEDED WITH FOOD, Disp: , Rfl: 1 .  Multiple Vitamin (MULTIVITAMIN) tablet, Take 1 tablet by mouth daily., Disp: , Rfl:  .  Polyethylene Glycol 3350 (MIRALAX PO), Take 17 g by mouth as needed. , Disp: , Rfl:  .  Triamcinolone Acetonide (NASACORT ALLERGY 24HR NA), Place 1 spray into the nose as needed. , Disp: , Rfl:  .  venlafaxine XR (EFFEXOR-XR) 75 MG 24 hr capsule, Take 1 capsule (75 mg total) by mouth at bedtime., Disp: 30 capsule, Rfl: 3   Observations/Objective: Blood pressure 110/60, pulse (!) 108, temperature 99.3 F (37.4 C), temperature source Temporal, height 5' 7.25" (1.708 m), weight 158 lb 8 oz (71.9 kg), SpO2 99 %.  Physical Exam Constitutional:      General: She is not in acute distress.    Appearance: Normal appearance. She is well-developed. She is not ill-appearing or toxic-appearing.  HENT:     Head: Normocephalic.     Right Ear: Hearing, tympanic membrane, ear canal and external ear normal.     Left Ear: Hearing, tympanic membrane, ear canal and external ear normal.     Nose: Nose normal.  Eyes:     General: Lids are normal. Lids are everted, no foreign bodies appreciated.     Conjunctiva/sclera: Conjunctivae normal.     Pupils: Pupils are equal, round, and reactive to light.  Neck:     Musculoskeletal: Normal range of motion and neck supple.     Thyroid: No thyroid mass or thyromegaly.     Vascular: No carotid bruit.     Trachea: Trachea normal.  Cardiovascular:     Rate and Rhythm: Normal rate and regular rhythm.     Heart sounds: Normal heart sounds, S1 normal and S2 normal. No murmur. No gallop.   Pulmonary:     Effort: Pulmonary effort is normal. No respiratory distress.     Breath sounds:  Normal breath sounds. No wheezing, rhonchi or rales.  Abdominal:     General: Bowel sounds are normal. There is no distension or abdominal bruit.     Palpations: Abdomen is soft. There is no fluid wave or mass.     Tenderness: There is no abdominal tenderness. There is no guarding or rebound.     Hernia: No hernia is present.  Lymphadenopathy:     Cervical: No cervical adenopathy.  Skin:    General: Skin is warm and dry.     Findings: No rash.  Neurological:     Mental Status: She is alert.     Cranial Nerves: No cranial nerve deficit.     Sensory:  No sensory deficit.  Psychiatric:        Mood and Affect: Mood is not anxious or depressed.        Speech: Speech normal.        Behavior: Behavior normal. Behavior is cooperative.        Judgment: Judgment normal.      Assessment and Plan   The patient's preventative maintenance and recommended screening tests for an annual wellness exam were reviewed in full today. Brought up to date unless services declined.  Counselled on the importance of diet, exercise, and its role in overall health and mortality. The patient's FH and SH was reviewed, including their home life, tobacco status, and drug and alcohol status.   Vaccines:uptodate with PNA, flu Nonsmoker  Mammo: nml in 09/2018, repeat q1years DVE/PAP: Sees Dr. Nori Riis once a year.  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 SE of constipation. StoppedFosamax after surgery.Repeat DXA 12/2015 NORMAL on no med. Repeat in 2022 HCV neg in 2014.   Eliezer Lofts, MD

## 2019-06-02 MED FILL — VENLAFAXINE HCL ER 75 MG CA: 75 | 90 days supply | Qty: 90 | Fill #1

## 2019-06-16 DIAGNOSIS — H17812 Minor opacity of cornea, left eye: Secondary | ICD-10-CM | POA: Diagnosis not present

## 2019-06-16 DIAGNOSIS — H52223 Regular astigmatism, bilateral: Secondary | ICD-10-CM | POA: Diagnosis not present

## 2019-06-16 DIAGNOSIS — Z9841 Cataract extraction status, right eye: Secondary | ICD-10-CM | POA: Diagnosis not present

## 2019-06-16 DIAGNOSIS — Z9842 Cataract extraction status, left eye: Secondary | ICD-10-CM | POA: Diagnosis not present

## 2019-06-23 ENCOUNTER — Other Ambulatory Visit: Payer: Self-pay | Admitting: Family Medicine

## 2019-06-23 MED FILL — ATORVASTATIN 10 MG TABLET: 10 | 90 days supply | Qty: 90 | Fill #0

## 2019-07-07 MED FILL — MELOXICAM 15 MG TABLET: 15 | 30 days supply | Qty: 30 | Fill #1

## 2019-07-27 ENCOUNTER — Other Ambulatory Visit: Payer: Self-pay | Admitting: Family Medicine

## 2019-07-27 MED FILL — LOSARTAN-HCTZ 100-25 MG TAB: 100-25 | 90 days supply | Qty: 90 | Fill #0

## 2019-09-01 MED FILL — VENLAFAXINE HCL ER 75 MG CA: 75 | 90 days supply | Qty: 90 | Fill #2

## 2019-09-22 MED FILL — ATORVASTATIN 10 MG TABLET: 10 | 90 days supply | Qty: 90 | Fill #1

## 2019-09-29 DIAGNOSIS — Z1231 Encounter for screening mammogram for malignant neoplasm of breast: Secondary | ICD-10-CM | POA: Diagnosis not present

## 2019-09-29 LAB — HM MAMMOGRAPHY

## 2019-10-01 ENCOUNTER — Encounter: Payer: Self-pay | Admitting: Family Medicine

## 2019-10-29 MED FILL — LOSARTAN-HCTZ 100-25 MG TAB: 100-25 | 90 days supply | Qty: 90 | Fill #1

## 2019-11-05 HISTORY — PX: DENTAL SURGERY: SHX609

## 2019-11-17 MED FILL — PENICILLIN VK 500 MG TABLET: 500 | 4 days supply | Qty: 17 | Fill #0

## 2019-12-01 MED FILL — VENLAFAXINE HCL ER 75 MG CA: 75 | 90 days supply | Qty: 90 | Fill #3

## 2019-12-22 MED FILL — ATORVASTATIN 10 MG TABLET: 10 | 90 days supply | Qty: 90 | Fill #2

## 2019-12-28 ENCOUNTER — Other Ambulatory Visit (HOSPITAL_COMMUNITY): Payer: Self-pay | Admitting: Obstetrics & Gynecology

## 2019-12-28 DIAGNOSIS — Z01419 Encounter for gynecological examination (general) (routine) without abnormal findings: Secondary | ICD-10-CM | POA: Diagnosis not present

## 2019-12-28 DIAGNOSIS — Z6825 Body mass index (BMI) 25.0-25.9, adult: Secondary | ICD-10-CM | POA: Diagnosis not present

## 2020-02-01 ENCOUNTER — Other Ambulatory Visit: Payer: Self-pay | Admitting: Family Medicine

## 2020-02-01 MED FILL — LOSARTAN-HCTZ 100-25 MG TAB: 100-25 | 90 days supply | Qty: 90 | Fill #0

## 2020-02-25 ENCOUNTER — Ambulatory Visit (INDEPENDENT_AMBULATORY_CARE_PROVIDER_SITE_OTHER): Payer: PPO | Admitting: Family Medicine

## 2020-02-25 ENCOUNTER — Other Ambulatory Visit: Payer: Self-pay

## 2020-02-25 ENCOUNTER — Encounter: Payer: Self-pay | Admitting: Family Medicine

## 2020-02-25 VITALS — BP 132/66 | HR 106 | Temp 98.5°F | Ht 67.25 in | Wt 159.8 lb

## 2020-02-25 DIAGNOSIS — R300 Dysuria: Secondary | ICD-10-CM | POA: Diagnosis not present

## 2020-02-25 LAB — POC URINALSYSI DIPSTICK (AUTOMATED)
Bilirubin, UA: NEGATIVE
Glucose, UA: NEGATIVE
Ketones, UA: NEGATIVE
Nitrite, UA: NEGATIVE
Protein, UA: POSITIVE — AB
Spec Grav, UA: 1.015 (ref 1.010–1.025)
Urobilinogen, UA: 0.2 E.U./dL
pH, UA: 7.5 (ref 5.0–8.0)

## 2020-02-25 MED ORDER — SULFAMETHOXAZOLE-TRIMETHOPRIM 800-160 MG PO TABS: 1.0000 | ORAL_TABLET | Freq: Two times a day (BID) | ORAL | 0 refills | Status: DC

## 2020-02-25 MED FILL — SULFAMETHOXAZOLE-TMP DS TAB: 800-160 | 3 days supply | Qty: 6 | Fill #0

## 2020-02-25 NOTE — Assessment & Plan Note (Signed)
Likely UTI.  Send culture.  Treat as uncomplicated UTI with  Bactrim DS x 3 days.  Increase water.

## 2020-02-25 NOTE — Patient Instructions (Signed)
Urinary Tract Infection, Adult A urinary tract infection (UTI) is an infection of any part of the urinary tract. The urinary tract includes:  The kidneys.  The ureters.  The bladder.  The urethra. These organs make, store, and get rid of pee (urine) in the body. What are the causes? This is caused by germs (bacteria) in your genital area. These germs grow and cause swelling (inflammation) of your urinary tract. What increases the risk? You are more likely to develop this condition if:  You have a small, thin tube (catheter) to drain pee.  You cannot control when you pee or poop (incontinence).  You are female, and: ? You use these methods to prevent pregnancy:  A medicine that kills sperm (spermicide).  A device that blocks sperm (diaphragm). ? You have low levels of a female hormone (estrogen). ? You are pregnant.  You have genes that add to your risk.  You are sexually active.  You take antibiotic medicines.  You have trouble peeing because of: ? A prostate that is bigger than normal, if you are female. ? A blockage in the part of your body that drains pee from the bladder (urethra). ? A kidney stone. ? A nerve condition that affects your bladder (neurogenic bladder). ? Not getting enough to drink. ? Not peeing often enough.  You have other conditions, such as: ? Diabetes. ? A weak disease-fighting system (immune system). ? Sickle cell disease. ? Gout. ? Injury of the spine. What are the signs or symptoms? Symptoms of this condition include:  Needing to pee right away (urgently).  Peeing often.  Peeing small amounts often.  Pain or burning when peeing.  Blood in the pee.  Pee that smells bad or not like normal.  Trouble peeing.  Pee that is cloudy.  Fluid coming from the vagina, if you are female.  Pain in the belly or lower back. Other symptoms include:  Throwing up (vomiting).  No urge to eat.  Feeling mixed up (confused).  Being tired  and grouchy (irritable).  A fever.  Watery poop (diarrhea). How is this treated? This condition may be treated with:  Antibiotic medicine.  Other medicines.  Drinking enough water. Follow these instructions at home:  Medicines  Take over-the-counter and prescription medicines only as told by your doctor.  If you were prescribed an antibiotic medicine, take it as told by your doctor. Do not stop taking it even if you start to feel better. General instructions  Make sure you: ? Pee until your bladder is empty. ? Do not hold pee for a long time. ? Empty your bladder after sex. ? Wipe from front to back after pooping if you are a female. Use each tissue one time when you wipe.  Drink enough fluid to keep your pee pale yellow.  Keep all follow-up visits as told by your doctor. This is important. Contact a doctor if:  You do not get better after 1-2 days.  Your symptoms go away and then come back. Get help right away if:  You have very bad back pain.  You have very bad pain in your lower belly.  You have a fever.  You are sick to your stomach (nauseous).  You are throwing up. Summary  A urinary tract infection (UTI) is an infection of any part of the urinary tract.  This condition is caused by germs in your genital area.  There are many risk factors for a UTI. These include having a small, thin   tube to drain pee and not being able to control when you pee or poop.  Treatment includes antibiotic medicines for germs.  Drink enough fluid to keep your pee pale yellow. This information is not intended to replace advice given to you by your health care provider. Make sure you discuss any questions you have with your health care provider. Document Revised: 07/10/2018 Document Reviewed: 01/30/2018 Elsevier Patient Education  2020 [employer] Inc[employer]  

## 2020-02-25 NOTE — Progress Notes (Signed)
Chief Complaint  Patient presents with  . Dysuria    History of Present Illness: Dysuria  This is a new problem. The current episode started 1 to 4 weeks ago (10 days ago). The problem has been waxing and waning. The quality of the pain is described as burning. The pain is moderate. There has been no fever. She is not sexually active. There is no history of pyelonephritis. Associated symptoms include frequency and urgency. Pertinent negatives include no chills, flank pain, hematuria, hesitancy, nausea or vomiting. She has tried increased fluids for the symptoms. The treatment provided mild relief. There is no history of catheterization, kidney stones, recurrent UTIs, urinary stasis or a urological procedure.    69 year old female presents for  new onset dysuria.  Last UTI  2016 EColi.    This visit occurred during the SARS-CoV-2 public health emergency.  Safety protocols were in place, including screening questions prior to the visit, additional usage of staff PPE, and extensive cleaning of exam room while observing appropriate contact time as indicated for disinfecting solutions.   COVID 19 screen:  No recent travel or known exposure to COVID19 The patient denies respiratory symptoms of COVID 19 at this time. The importance of social distancing was discussed today.     Review of Systems  Constitutional: Negative for chills.  Gastrointestinal: Negative for nausea and vomiting.  Genitourinary: Positive for dysuria, frequency and urgency. Negative for flank pain, hematuria and hesitancy.      Past Medical History:  Diagnosis Date  . Arthritis    spinal stenosis--back pain--hx of herniated cervical disk- but no surgery  . Asthma   . Cold 10/03/12   pt getting over a cold--still has some head congestion, slight cough  . Colon polyps   . Endometrial ca ([place])   . H/O: hematuria    benign essential hematuria - per urology work up   . History of depression   . History of pneumonia    . Hypertension   . Numbness in right leg    pt. states occassional in right leg and right big toe from disc herniations  . PONV (postoperative nausea and vomiting)    with prior hip replacement  . Stress incontinence   . Wears glasses     reports that she has never smoked. She has never used smokeless tobacco. She reports current alcohol use of about 3.0 standard drinks of alcohol per week. She reports that she does not use drugs.   Current Outpatient Medications:  .  albuterol (PROVENTIL HFA;VENTOLIN HFA) 108 (90 Base) MCG/ACT inhaler, Inhale 2 puffs into the lungs every 6 (six) hours as needed for wheezing or shortness of breath., Disp: 1 Inhaler, Rfl: 2 .  atorvastatin (LIPITOR) 10 MG tablet, TAKE 1 TABLET BY MOUTH ONCE DAILY, Disp: 90 tablet, Rfl: 2 .  Calcium Carb-Cholecalciferol (CALCIUM 600+D) 600-800 MG-UNIT TABS, Take 1 tablet by mouth daily., Disp: , Rfl:  .  fexofenadine (ALLEGRA) 180 MG tablet, Take 180 mg by mouth daily as needed for allergies or rhinitis., Disp: , Rfl:  .  ibuprofen (ADVIL,MOTRIN) 200 MG tablet, Take 600 mg by mouth every 8 (eight) hours as needed., Disp: , Rfl:  .  Lactobacillus Rhamnosus, GG, (CULTURELLE) CAPS, Take 1 capsule by mouth daily., Disp: , Rfl:  .  losartan-hydrochlorothiazide (HYZAAR) 100-25 MG tablet, TAKE 1 TABLET BY MOUTH DAILY., Disp: 90 tablet, Rfl: 0 .  magnesium gluconate (MAGONATE) 500 MG tablet, Take 500 mg by mouth daily., Disp: , Rfl:  .  meloxicam (MOBIC) 15 MG tablet, TAKE 1 TABLET BY MOUTH DAILY AS NEEDED WITH FOOD, Disp: , Rfl: 1 .  Multiple Vitamin (MULTIVITAMIN) tablet, Take 1 tablet by mouth daily., Disp: , Rfl:  .  Polyethylene Glycol 3350 (MIRALAX PO), Take 17 g by mouth as needed. , Disp: , Rfl:  .  Triamcinolone Acetonide (NASACORT ALLERGY 24HR NA), Place 1 spray into the nose as needed. , Disp: , Rfl:  .  venlafaxine XR (EFFEXOR-XR) 75 MG 24 hr capsule, Take 1 capsule (75 mg total) by mouth at bedtime., Disp: 30 capsule,  Rfl: 3   Observations/Objective: Blood pressure 132/66, pulse (!) 106, temperature 98.5 F (36.9 C), temperature source Temporal, height 5' 7.25" (1.708 m), weight 159 lb 12 oz (72.5 kg), SpO2 98 %.  Physical Exam Constitutional:      General: She is not in acute distress.    Appearance: Normal appearance. She is well-developed. She is not ill-appearing or toxic-appearing.  HENT:     Head: Normocephalic.     Right Ear: Hearing, tympanic membrane, ear canal and external ear normal. Tympanic membrane is not erythematous, retracted or bulging.     Left Ear: Hearing, tympanic membrane, ear canal and external ear normal. Tympanic membrane is not erythematous, retracted or bulging.     Nose: No mucosal edema or rhinorrhea.     Right Sinus: No maxillary sinus tenderness or frontal sinus tenderness.     Left Sinus: No maxillary sinus tenderness or frontal sinus tenderness.     Mouth/Throat:     Pharynx: Uvula midline.  Eyes:     General: Lids are normal. Lids are everted, no foreign bodies appreciated.     Conjunctiva/sclera: Conjunctivae normal.     Pupils: Pupils are equal, round, and reactive to light.  Neck:     Thyroid: No thyroid mass or thyromegaly.     Vascular: No carotid bruit.     Trachea: Trachea normal.  Cardiovascular:     Rate and Rhythm: Normal rate and regular rhythm.     Pulses: Normal pulses.     Heart sounds: Normal heart sounds, S1 normal and S2 normal. No murmur heard.  No friction rub. No gallop.   Pulmonary:     Effort: Pulmonary effort is normal. No tachypnea or respiratory distress.     Breath sounds: Normal breath sounds. No decreased breath sounds, wheezing, rhonchi or rales.  Abdominal:     General: Bowel sounds are normal.     Palpations: Abdomen is soft.     Tenderness: There is no abdominal tenderness.  Musculoskeletal:     Cervical back: Normal range of motion and neck supple.  Skin:    General: Skin is warm and dry.     Findings: No rash.   Neurological:     Mental Status: She is alert.  Psychiatric:        Mood and Affect: Mood is not anxious or depressed.        Speech: Speech normal.        Behavior: Behavior normal. Behavior is cooperative.        Thought Content: Thought content normal.        Judgment: Judgment normal.      Assessment and Plan    Dysuria Likely UTI.  Send culture.  Treat as uncomplicated UTI with  Bactrim DS x 3 days.  Increase water.    Eliezer Lofts, MD

## 2020-02-26 LAB — URINE CULTURE
MICRO NUMBER:: 10737513
SPECIMEN QUALITY:: ADEQUATE

## 2020-02-29 MED FILL — VENLAFAXINE HCL ER 75 MG CA: 75 | 90 days supply | Qty: 90 | Fill #0

## 2020-03-10 MED FILL — AMOXICILLIN 500 MG CAPSULE: 500 | 7 days supply | Qty: 21 | Fill #0 | Status: TO

## 2020-03-10 MED FILL — AMOXICILLIN 500 MG CAPSULE: 500 | 7 days supply | Qty: 21 | Fill #0

## 2020-03-21 ENCOUNTER — Other Ambulatory Visit: Payer: Self-pay | Admitting: Family Medicine

## 2020-03-21 MED FILL — ATORVASTATIN CALCIUM 10 MG: 10 | 90 days supply | Qty: 90 | Fill #0

## 2020-03-21 NOTE — Telephone Encounter (Signed)
Please schedule MWV with nurse and CPE with Dr. Diona Browner for September.

## 2020-03-22 NOTE — Telephone Encounter (Signed)
Patient scheduled for cpe, labs, and MWV.

## 2020-03-22 NOTE — Telephone Encounter (Signed)
Left message asking pt to call office  °

## 2020-04-26 ENCOUNTER — Ambulatory Visit (INDEPENDENT_AMBULATORY_CARE_PROVIDER_SITE_OTHER): Payer: PPO

## 2020-04-26 ENCOUNTER — Other Ambulatory Visit: Payer: Self-pay

## 2020-04-26 VITALS — BP 123/73 | Wt 155.0 lb

## 2020-04-26 DIAGNOSIS — Z0000 Encounter for general adult medical examination without abnormal findings: Secondary | ICD-10-CM

## 2020-04-26 NOTE — Patient Instructions (Signed)
Christina Griffith , Thank you for taking time to come for your Medicare Wellness Visit. I appreciate your ongoing commitment to your health goals. Please review the following plan we discussed and let me know if I can assist you in the future.   Screening recommendations/referrals: Colonoscopy: Up to date, completed 08/05/2015, due 07/2025 Mammogram: Up to date, completed 09/29/2019, due 09/2020 Bone Density: Up to date, completed 12/06/2015, due in 5 years  Recommended yearly ophthalmology/optometry visit for glaucoma screening and checkup Recommended yearly dental visit for hygiene and checkup  Vaccinations: Influenza vaccine: due, will get at office visit  Pneumococcal vaccine: Completed series Tdap vaccine: decline (insurance)  Shingles vaccine: due, check with your insurance regarding coverage if interested   Covid-19:Completed series  Advanced directives: copy in chart   Conditions/risks identified: hypertension, hypercholesterolemia   Next appointment: Follow up in one year for your annual wellness visit    Preventive Care 70 Years and Older, Female Preventive care refers to lifestyle choices and visits with your health care provider that can promote health and wellness. What does preventive care include?  A yearly physical exam. This is also called an annual well check.  Dental exams once or twice a year.  Routine eye exams. Ask your health care provider how often you should have your eyes checked.  Personal lifestyle choices, including:  Daily care of your teeth and gums.  Regular physical activity.  Eating a healthy diet.  Avoiding tobacco and drug use.  Limiting alcohol use.  Practicing safe sex.  Taking low-dose aspirin every day.  Taking vitamin and mineral supplements as recommended by your health care provider. What happens during an annual well check? The services and screenings done by your health care provider during your annual well check will depend on  your age, overall health, lifestyle risk factors, and family history of disease. Counseling  Your health care provider may ask you questions about your:  Alcohol use.  Tobacco use.  Drug use.  Emotional well-being.  Home and relationship well-being.  Sexual activity.  Eating habits.  History of falls.  Memory and ability to understand (cognition).  Work and work Statistician.  Reproductive health. Screening  You may have the following tests or measurements:  Height, weight, and BMI.  Blood pressure.  Lipid and cholesterol levels. These may be checked every 5 years, or more frequently if you are over 67 years old.  Skin check.  Lung cancer screening. You may have this screening every year starting at age 49 if you have a 30-pack-year history of smoking and currently smoke or have quit within the past 15 years.  Fecal occult blood test (FOBT) of the stool. You may have this test every year starting at age 25.  Flexible sigmoidoscopy or colonoscopy. You may have a sigmoidoscopy every 5 years or a colonoscopy every 10 years starting at age 34.  Hepatitis C blood test.  Hepatitis B blood test.  Sexually transmitted disease (STD) testing.  Diabetes screening. This is done by checking your blood sugar (glucose) after you have not eaten for a while (fasting). You may have this done every 1-3 years.  Bone density scan. This is done to screen for osteoporosis. You may have this done starting at age 53.  Mammogram. This may be done every 1-2 years. Talk to your health care provider about how often you should have regular mammograms. Talk with your health care provider about your test results, treatment options, and if necessary, the need for more tests.  Vaccines  Your health care provider may recommend certain vaccines, such as:  Influenza vaccine. This is recommended every year.  Tetanus, diphtheria, and acellular pertussis (Tdap, Td) vaccine. You may need a Td booster  every 10 years.  Zoster vaccine. You may need this after age 45.  Pneumococcal 13-valent conjugate (PCV13) vaccine. One dose is recommended after age 84.  Pneumococcal polysaccharide (PPSV23) vaccine. One dose is recommended after age 36. Talk to your health care provider about which screenings and vaccines you need and how often you need them. This information is not intended to replace advice given to you by your health care provider. Make sure you discuss any questions you have with your health care provider. Document Released: 08/19/2015 Document Revised: 04/11/2016 Document Reviewed: 05/24/2015 Elsevier Interactive Patient Education  2017 [employer] [employer] Prevention in the Home Falls can cause injuries. They can happen to people of all ages. There are many things you can do to make your home safe and to help prevent falls. What can I do on the outside of my home?  Regularly fix the edges of walkways and driveways and fix any cracks.  Remove anything that might make you trip as you walk through a door, such as a raised step or threshold.  Trim any bushes or trees on the path to your home.  Use bright outdoor lighting.  Clear any walking paths of anything that might make someone trip, such as rocks or tools.  Regularly check to see if handrails are loose or broken. Make sure that both sides of any steps have handrails.  Any raised decks and porches should have guardrails on the edges.  Have any leaves, snow, or ice cleared regularly.  Use sand or salt on walking paths during winter.  Clean up any spills in your garage right away. This includes oil or grease spills. What can I do in the bathroom?  Use night lights.  Install grab bars by the toilet and in the tub and shower. Do not use towel bars as grab bars.  Use non-skid mats or decals in the tub or shower.  If you need to sit down in the shower, use a plastic, non-slip stool.  Keep the floor dry. Clean up any  water that spills on the floor as soon as it happens.  Remove soap buildup in the tub or shower regularly.  Attach bath mats securely with double-sided non-slip rug tape.  Do not have throw rugs and other things on the floor that can make you trip. What can I do in the bedroom?  Use night lights.  Make sure that you have a light by your bed that is easy to reach.  Do not use any sheets or blankets that are too big for your bed. They should not hang down onto the floor.  Have a firm chair that has side arms. You can use this for support while you get dressed.  Do not have throw rugs and other things on the floor that can make you trip. What can I do in the kitchen?  Clean up any spills right away.  Avoid walking on wet floors.  Keep items that you use a lot in easy-to-reach places.  If you need to reach something above you, use a strong step stool that has a grab bar.  Keep electrical cords out of the way.  Do not use floor polish or wax that makes floors slippery. If you must use wax, use non-skid floor wax.  Do not have throw rugs and other things on the floor that can make you trip. What can I do with my stairs?  Do not leave any items on the stairs.  Make sure that there are handrails on both sides of the stairs and use them. Fix handrails that are broken or loose. Make sure that handrails are as long as the stairways.  Check any carpeting to make sure that it is firmly attached to the stairs. Fix any carpet that is loose or worn.  Avoid having throw rugs at the top or bottom of the stairs. If you do have throw rugs, attach them to the floor with carpet tape.  Make sure that you have a light switch at the top of the stairs and the bottom of the stairs. If you do not have them, ask someone to add them for you. What else can I do to help prevent falls?  Wear shoes that:  Do not have high heels.  Have rubber bottoms.  Are comfortable and fit you well.  Are closed  at the toe. Do not wear sandals.  If you use a stepladder:  Make sure that it is fully opened. Do not climb a closed stepladder.  Make sure that both sides of the stepladder are locked into place.  Ask someone to hold it for you, if possible.  Clearly mark and make sure that you can see:  Any grab bars or handrails.  First and last steps.  Where the edge of each step is.  Use tools that help you move around (mobility aids) if they are needed. These include:  Canes.  Walkers.  Scooters.  Crutches.  Turn on the lights when you go into a dark area. Replace any light bulbs as soon as they burn out.  Set up your furniture so you have a clear path. Avoid moving your furniture around.  If any of your floors are uneven, fix them.  If there are any pets around you, be aware of where they are.  Review your medicines with your doctor. Some medicines can make you feel dizzy. This can increase your chance of falling. Ask your doctor what other things that you can do to help prevent falls. This information is not intended to replace advice given to you by your health care provider. Make sure you discuss any questions you have with your health care provider. Document Released: 05/19/2009 Document Revised: 12/29/2015 Document Reviewed: 08/27/2014 Elsevier Interactive Patient Education  2017 [employer].

## 2020-04-26 NOTE — Progress Notes (Signed)
Subjective:   Christina Griffith is a 69 y.o. female who presents for Medicare Annual (Subsequent) preventive examination.  Review of Systems: N/A      I connected with the patient today by telephone and verified that I am speaking with the correct person using two identifiers. Location patient: home Location nurse: work Persons participating in the telephone visit: patient, nurse.   I discussed the limitations, risks, security and privacy concerns of performing an evaluation and management service by telephone and the availability of in person appointments. I also discussed with the patient that there may be a patient responsible charge related to this service. The patient expressed understanding and verbally consented to this telephonic visit.        Cardiac Risk Factors include: advanced age (>52men, >38 women);hypertension;Other (see comment), Risk factor comments: hypercholesterolemia     Objective:    Today's Vitals   04/26/20 0814 04/26/20 0817  BP: 123/73   Weight: 155 lb (70.3 kg)   PainSc:  7    Body mass index is 24.1 kg/m.  Advanced Directives 04/26/2020 04/24/2019 07/01/2018 04/18/2018 07/03/2017 04/05/2017 04/03/2016  Does Patient Have a Medical Advance Directive? Yes Yes Yes Yes Yes Yes Yes  Type of Paramedic of [place];Living will [place];Living will Living will [place];Living will Living will [place];Living will [place];Living will  Does patient want to make changes to medical advance directive? - - No - Patient declined - - No - Patient declined No - Patient declined  Copy of [place] in Chart? Yes - validated most recent copy scanned in chart (See row information) Yes - validated most recent copy scanned in chart (See row information) - Yes Yes Yes Yes  Pre-existing out of facility DNR order (yellow form or pink MOST form) - - - - - - -     Current Medications (verified) Outpatient Encounter Medications as of 04/26/2020  Medication Sig  . albuterol (PROVENTIL HFA;VENTOLIN HFA) 108 (90 Base) MCG/ACT inhaler Inhale 2 puffs into the lungs every 6 (six) hours as needed for wheezing or shortness of breath.  Marland Kitchen atorvastatin (LIPITOR) 10 MG tablet TAKE 1 TABLET BY MOUTH ONCE DAILY  . Calcium Carb-Cholecalciferol (CALCIUM 600+D) 600-800 MG-UNIT TABS Take 1 tablet by mouth daily.  . fexofenadine (ALLEGRA) 180 MG tablet Take 180 mg by mouth daily as needed for allergies or rhinitis.  Marland Kitchen ibuprofen (ADVIL,MOTRIN) 200 MG tablet Take 600 mg by mouth every 8 (eight) hours as needed.  . Lactobacillus Rhamnosus, GG, (CULTURELLE) CAPS Take 1 capsule by mouth daily.  Marland Kitchen losartan-hydrochlorothiazide (HYZAAR) 100-25 MG tablet TAKE 1 TABLET BY MOUTH DAILY.  . magnesium gluconate (MAGONATE) 500 MG tablet Take 500 mg by mouth daily.  . Multiple Vitamin (MULTIVITAMIN) tablet Take 1 tablet by mouth daily.  . Polyethylene Glycol 3350 (MIRALAX PO) Take 17 g by mouth as needed.   . Triamcinolone Acetonide (NASACORT ALLERGY 24HR NA) Place 1 spray into the nose as needed.   . venlafaxine XR (EFFEXOR-XR) 75 MG 24 hr capsule Take 1 capsule (75 mg total) by mouth at bedtime.  . meloxicam (MOBIC) 15 MG tablet TAKE 1 TABLET BY MOUTH DAILY AS NEEDED WITH FOOD  . sulfamethoxazole-trimethoprim (BACTRIM DS) 800-160 MG tablet Take 1 tablet by mouth 2 (two) times daily.   No facility-administered encounter medications on file as of 04/26/2020.    Allergies (verified) Patient has no known allergies.   History:  Past Medical History:  Diagnosis Date  . Arthritis    spinal stenosis--back pain--hx of herniated cervical disk- but no surgery  . Asthma   . Cold 10/03/12   pt getting over a cold--still has some head congestion, slight cough  . Colon polyps   . Endometrial ca ([place])   . H/O: hematuria    benign essential hematuria - per urology work up   . History of  depression   . History of pneumonia   . Hypertension   . Numbness in right leg    pt. states occassional in right leg and right big toe from disc herniations  . PONV (postoperative nausea and vomiting)    with prior hip replacement  . Stress incontinence   . Wears glasses    Past Surgical History:  Procedure Laterality Date  . BREAST SURGERY Right 1980's   breast biopsy  . COLONOSCOPY    . DENTAL SURGERY  11/2019  . EYE SURGERY Bilateral 02/2015   cataracts  . HYSTEROSCOPY WITH D & C Left 1/24/204   w/ resection of Endometrial polyps  . ROBOTIC ASSISTED TOTAL HYSTERECTOMY WITH BILATERAL SALPINGO OOPHERECTOMY N/A 10/07/2012   Procedure: ROBOTIC ASSISTED TOTAL HYSTERECTOMY WITH BILATERAL SALPINGO OOPHORECTOMY/POSSIBLE LYMPH NODE BIOPSY;  Surgeon: Imagene Gurney A. Alycia Rossetti, MD;  Location: [facility];  Service: Gynecology;  Laterality: N/A;  . TOTAL HIP ARTHROPLASTY     right 2002 left 2004  . TOTAL HIP REVISION Right 05/09/2015   Procedure: TOTAL HIP REVISION;  Surgeon: Frederik Pear, MD;  Location: [facility];  Service: Orthopedics;  Laterality: Right;   Family History  Problem Relation Age of Onset  . Hypertension Mother   . Cancer Father        bladder and prostate  . Hyperlipidemia Father   . Cancer Maternal Grandmother        colon  . Breast cancer Cousin    Social History   Socioeconomic History  . Marital status: Single    Spouse name: Not on file  . Number of children: Not on file  . Years of education: Not on file  . Highest education level: Not on file  Occupational History  . Occupation: Counselling psychologist: [employer]  Tobacco Use  . Smoking status: Never Smoker  . Smokeless tobacco: Never Used  Vaping Use  . Vaping Use: Never used  Substance and Sexual Activity  . Alcohol use: Yes    Alcohol/week: 3.0 standard drinks    Types: 3 Glasses of wine per week    Comment: 3 x a week  . Drug use: No  . Sexual activity: Not on file  Other Topics Concern  . Not  on file  Social History Narrative   Regular exercise- yes, 5-6 days a week, yoga   Diet: healthy   Social Determinants of Health   Financial Resource Strain: Low Risk   . Difficulty of Paying Living Expenses: Not hard at all  Food Insecurity: No Food Insecurity  . Worried About Charity fundraiser in the Last Year: Never true  . Ran Out of Food in the Last Year: Never true  Transportation Needs: No Transportation Needs  . Lack of Transportation (Medical): No  . Lack of Transportation (Non-Medical): No  Physical Activity: Sufficiently Active  . Days of Exercise per Week: 5 days  . Minutes of Exercise per Session: 30 min  Stress: No Stress Concern Present  . Feeling of Stress : Only a little  Social Connections:   . Frequency of Communication with Friends and Family: Not on file  . Frequency of Social Gatherings with Friends and Family: Not on file  . Attends Religious Services: Not on file  . Active Member of Clubs or Organizations: Not on file  . Attends Archivist Meetings: Not on file  . Marital Status: Not on file    Tobacco Counseling Counseling given: Not Answered   Clinical Intake:  Pre-visit preparation completed: Yes  Pain : 0-10 Pain Score: 7  Pain Type: Chronic pain Pain Location: Back Pain Orientation: Upper Pain Descriptors / Indicators: Aching Pain Onset: More than a month ago Pain Frequency: Intermittent Pain Relieving Factors: lying on floor  Pain Relieving Factors: lying on floor  Nutritional Risks: Nausea/ vomitting/ diarrhea (diarrhea for 5 days 1 month ago) Diabetes: No  How often do you need to have someone help you when you read instructions, pamphlets, or other written materials from your doctor or pharmacy?: 1 - Never What is the last grade level you completed in school?: masters  Diabetic: No Nutrition Risk Assessment:  Has the patient had any N/V/D within the last 2 months?  Yes , diarrhea for 5 days with low grade fever  last month. Patient is better now.  Does the patient have any non-healing wounds?  No  Has the patient had any unintentional weight loss or weight gain?  No   Diabetes:  Is the patient diabetic?  No  If diabetic, was a CBG obtained today?  N/A Did the patient bring in their glucometer from home?  N/A How often do you monitor your CBG's? N/A.   Financial Strains and Diabetes Management:  Are you having any financial strains with the device, your supplies or your medication? N/A.  Does the patient want to be seen by Chronic Care Management for management of their diabetes?  N/A Would the patient like to be referred to a Nutritionist or for Diabetic Management?  N/A    Interpreter Needed?: No  Information entered by :: CJohnson, LPN   Activities of Daily Living In your present state of health, do you have any difficulty performing the following activities: 04/26/2020  Hearing? N  Vision? N  Difficulty concentrating or making decisions? N  Walking or climbing stairs? N  Dressing or bathing? N  Doing errands, shopping? N  Preparing Food and eating ? N  Using the Toilet? N  In the past six months, have you accidently leaked urine? Y  Comment wears pads  Do you have problems with loss of bowel control? N  Managing your Medications? N  Managing your Finances? N  Housekeeping or managing your Housekeeping? N  Some recent data might be hidden    Patient Care Team: Jinny Sanders, MD as PCP - General (Family Medicine)  Indicate any recent Medical Services you may have received from other than [facility] providers in the past year (date may be approximate).     Assessment:   This is a routine wellness examination for [place].  Hearing/Vision screen  Hearing Screening   125Hz  250Hz  500Hz  1000Hz  2000Hz  3000Hz  4000Hz  6000Hz  8000Hz   Right ear:           Left ear:           Vision Screening Comments: Patient gets annual eye exams.  Dietary issues and exercise activities  discussed: Current Exercise Habits: Home exercise routine, Type of exercise: stretching;walking;Other - see comments (exercise bike), Time (Minutes): 30, Frequency (Times/Week): 5, Weekly  Exercise (Minutes/Week): 150, Intensity: Moderate, Exercise limited by: None identified  Goals    . Increase physical activity     Starting 04/18/2018, I will continue to walk at least 60 minutes daily.     . Patient Stated     04/24/2019, Patient wants to improve her diet and eat better.    . Patient Stated     04/26/2020, I will continue to do yoga 3 days a week and exercise bike 2 days a week for about 30 minutes.       Depression Screen PHQ 2/9 Scores 04/26/2020 04/24/2019 04/18/2018 04/05/2017 04/03/2016 04/03/2016 03/29/2015  PHQ - 2 Score 0 0 0 0 0 0 0  PHQ- 9 Score 0 0 0 0 - - -    Fall Risk Fall Risk  04/26/2020 04/24/2019 04/18/2018 04/05/2017 04/03/2016  Falls in the past year? 1 0 No No Yes  Comment tripped on stairs - - - -  Number falls in past yr: 1 0 - - 1  Injury with Fall? 0 - - - No  Risk for fall due to : History of fall(s) Medication side effect - - -  Follow up Falls evaluation completed;Falls prevention discussed Falls evaluation completed;Falls prevention discussed - - Falls prevention discussed    Any stairs in or around the home? Yes  If so, are there any without handrails? Yes Home free of loose throw rugs in walkways, pet beds, electrical cords, etc? Yes  Adequate lighting in your home to reduce risk of falls? Yes   ASSISTIVE DEVICES UTILIZED TO PREVENT FALLS:  Life alert? No  Use of a cane, walker or w/c? Yes  Grab bars in the bathroom? No  Shower chair or bench in shower? No Elevated toilet seat or a handicapped toilet? No   TIMED UP AND GO:  Was the test performed? N/A, telephonic visit .    Cognitive Function: MMSE - Mini Mental State Exam 04/26/2020 04/24/2019 04/18/2018 04/05/2017  Orientation to time 5 5 5 5   Orientation to Place 5 5 5 5   Registration 3 3 3 3     Attention/ Calculation 5 5 0 0  Recall 3 3 3 3   Language- name 2 objects - - 0 0  Language- repeat 1 1 1 1   Language- follow 3 step command - - 3 3  Language- read & follow direction - - 0 0  Write a sentence - - 0 0  Copy design - - 0 0  Total score - - 20 20  Mini Cog  Mini-Cog screen was completed. Maximum score is 22. A value of 0 denotes this part of the MMSE was not completed or the patient failed this part of the Mini-Cog screening.       Immunizations Immunization History  Administered Date(s) Administered  . Influenza Whole 06/06/2010  . Influenza,inj,Quad PF,6+ Mos 04/15/2015, 04/30/2016, 04/18/2018, 04/17/2019  . PFIZER SARS-COV-2 Vaccination 08/20/2019, 09/09/2019  . Pneumococcal Conjugate-13 03/16/2014  . Pneumococcal Polysaccharide-23 12/14/2011, 04/05/2017  . Td 08/29/2007  . Zoster 11/24/2010    TDAP status: Due, Education has been provided regarding the importance of this vaccine. Advised may receive this vaccine at local pharmacy or Health Dept. Aware to provide a copy of the vaccination record if obtained from local pharmacy or Health Dept. Verbalized acceptance and understanding. Flu Vaccine status: due, will get at office visit  Pneumococcal vaccine status: Up to date Covid-19 vaccine status: Completed vaccines  Qualifies for Shingles Vaccine? Yes   Zostavax completed Yes  Shingrix Completed?: No.    Education has been provided regarding the importance of this vaccine. Patient has been advised to call insurance company to determine out of pocket expense if they have not yet received this vaccine. Advised may also receive vaccine at local pharmacy or Health Dept. Verbalized acceptance and understanding.  Screening Tests Health Maintenance  Topic Date Due  . INFLUENZA VACCINE  03/06/2020  . TETANUS/TDAP  04/26/2024 (Originally 08/28/2017)  . MAMMOGRAM  09/28/2020  . COLONOSCOPY  08/04/2025  . DEXA SCAN  Completed  . COVID-19 Vaccine  Completed  .  Hepatitis C Screening  Completed  . PNA vac Low Risk Adult  Completed    Health Maintenance  Health Maintenance Due  Topic Date Due  . INFLUENZA VACCINE  03/06/2020    Colorectal cancer screening: Completed 08/05/2015. Repeat every 10 years Mammogram status: Completed 09/29/2019. Repeat every year Bone Density status: Completed 12/06/2015. Results reflect: Bone density results: NORMAL. Repeat every 5 years.  Lung Cancer Screening: (Low Dose CT Chest recommended if Age 44-80 years, 30 pack-year currently smoking OR have quit w/in 15years.) does not qualify.    Additional Screening:  Hepatitis C Screening: does qualify; Completed 01/02/2013  Vision Screening: Recommended annual ophthalmology exams for early detection of glaucoma and other disorders of the eye. Is the patient up to date with their annual eye exam?  Yes  Who is the provider or what is the name of the office in which the patient attends annual eye exams? [facility] If pt is not established with a provider, would they like to be referred to a provider to establish care? No .   Dental Screening: Recommended annual dental exams for proper oral hygiene  Community Resource Referral / Chronic Care Management: CRR required this visit?  No   CCM required this visit?  No      Plan:     I have personally reviewed and noted the following in the patient's chart:   . Medical and social history . Use of alcohol, tobacco or illicit drugs  . Current medications and supplements . Functional ability and status . Nutritional status . Physical activity . Advanced directives . List of other physicians . Hospitalizations, surgeries, and ER visits in previous 12 months . Vitals . Screenings to include cognitive, depression, and falls . Referrals and appointments  In addition, I have reviewed and discussed with patient certain preventive protocols, quality metrics, and best practice recommendations. A written  personalized care plan for preventive services as well as general preventive health recommendations were provided to patient.   Due to this being a telephonic visit, the after visit summary with patients personalized plan was offered to patient via office or my-chart. Patient preferred to pick up at office at next visit or via mychart.   Andrez Grime, LPN   [id number]

## 2020-04-26 NOTE — Progress Notes (Signed)
PCP notes:  Health Maintenance:  Flu- due Tdap- insurance   Abnormal Screenings: none   Patient concerns: Upper back pain for about 3 months   Nurse concerns: none   Next PCP appt.: 05/05/2020 @ 11 am

## 2020-04-27 ENCOUNTER — Telehealth: Payer: Self-pay | Admitting: Family Medicine

## 2020-04-27 DIAGNOSIS — E7800 Pure hypercholesterolemia, unspecified: Secondary | ICD-10-CM

## 2020-04-27 NOTE — Telephone Encounter (Signed)
-----   Message from Cloyd Stagers, RT sent at 04/14/2020  2:14 PM EDT ----- Regarding: Lab Orders for Thursday 9.23.2021 Please place lab orders for Thursday 9.23.2021, office visit for physical on Thursday 9.30.2021 Thank you, Dyke Maes RT(R)

## 2020-04-28 ENCOUNTER — Ambulatory Visit: Payer: PPO

## 2020-04-28 ENCOUNTER — Other Ambulatory Visit: Payer: Self-pay | Admitting: Family Medicine

## 2020-04-28 ENCOUNTER — Other Ambulatory Visit: Payer: Self-pay

## 2020-04-28 ENCOUNTER — Other Ambulatory Visit (INDEPENDENT_AMBULATORY_CARE_PROVIDER_SITE_OTHER): Payer: PPO

## 2020-04-28 DIAGNOSIS — E7800 Pure hypercholesterolemia, unspecified: Secondary | ICD-10-CM | POA: Diagnosis not present

## 2020-04-28 LAB — COMPREHENSIVE METABOLIC PANEL
ALT: 24 U/L (ref 0–35)
AST: 28 U/L (ref 0–37)
Albumin: 4.3 g/dL (ref 3.5–5.2)
Alkaline Phosphatase: 59 U/L (ref 39–117)
BUN: 20 mg/dL (ref 6–23)
CO2: 31 mEq/L (ref 19–32)
Calcium: 9.4 mg/dL (ref 8.4–10.5)
Chloride: 102 mEq/L (ref 96–112)
Creatinine, Ser: 0.78 mg/dL (ref 0.40–1.20)
GFR: 72.92 mL/min (ref >60.00)
Glucose, Bld: 84 mg/dL (ref 70–99)
Potassium: 4 mEq/L (ref 3.5–5.1)
Sodium: 139 mEq/L (ref 135–145)
Total Bilirubin: 0.4 mg/dL (ref 0.2–1.2)
Total Protein: 7 g/dL (ref 6.0–8.3)

## 2020-04-28 LAB — LIPID PANEL
Cholesterol: 166 mg/dL (ref 0–200)
HDL: 61.9 mg/dL (ref >39.00)
LDL Cholesterol: 75 mg/dL (ref 0–99)
NonHDL: 103.97
Total CHOL/HDL Ratio: 3
Triglycerides: 144 mg/dL (ref 0.0–149.0)
VLDL: 28.8 mg/dL (ref 0.0–40.0)

## 2020-04-28 NOTE — Progress Notes (Signed)
No critical labs need to be addressed urgently. We will discuss labs in detail at upcoming office visit.   

## 2020-04-29 MED FILL — LOSARTAN-HCTZ 100-25 MG TAB: 100-25 | 90 days supply | Qty: 90 | Fill #0

## 2020-05-05 ENCOUNTER — Other Ambulatory Visit: Payer: Self-pay

## 2020-05-05 ENCOUNTER — Encounter: Payer: Self-pay | Admitting: Family Medicine

## 2020-05-05 ENCOUNTER — Ambulatory Visit (INDEPENDENT_AMBULATORY_CARE_PROVIDER_SITE_OTHER): Payer: PPO | Admitting: Family Medicine

## 2020-05-05 VITALS — BP 120/60 | HR 107 | Temp 98.2°F | Ht 67.0 in | Wt 155.8 lb

## 2020-05-05 DIAGNOSIS — Z23 Encounter for immunization: Secondary | ICD-10-CM

## 2020-05-05 DIAGNOSIS — E7800 Pure hypercholesterolemia, unspecified: Secondary | ICD-10-CM

## 2020-05-05 DIAGNOSIS — M542 Cervicalgia: Secondary | ICD-10-CM | POA: Diagnosis not present

## 2020-05-05 DIAGNOSIS — Z8542 Personal history of malignant neoplasm of other parts of uterus: Secondary | ICD-10-CM | POA: Diagnosis not present

## 2020-05-05 DIAGNOSIS — I10 Essential (primary) hypertension: Secondary | ICD-10-CM

## 2020-05-05 DIAGNOSIS — Z0000 Encounter for general adult medical examination without abnormal findings: Secondary | ICD-10-CM

## 2020-05-05 MED ORDER — MELOXICAM 15 MG PO TABS: 15.0000 mg | ORAL_TABLET | Freq: Every day | ORAL | 0 refills | Status: DC

## 2020-05-05 MED FILL — MELOXICAM 15 MG TABLET: 15 | 30 days supply | Qty: 30 | Fill #0

## 2020-05-05 NOTE — Assessment & Plan Note (Signed)
Well controlled. Continue current medication.  

## 2020-05-05 NOTE — Progress Notes (Signed)
Chief Complaint  Patient presents with  . Annual Exam    Part 2    History of Present Illness: HPI The patient presents for  complete physical and review of chronic health problems. He/She also has the following acute concerns today: Had been having point tenderness in left upper back.. radiated to left outer arm... x 3 months Better with moving head to right.  From elbow down off and on tingling in left arm.  No weakness.  Has been using heat, PT, regular ibuprofen/aleve... has had soe stomach issue with this... has had 40-50% improvement.  Turmeric, boswellia extract and tart cherry.. has helped.  Hemp gummies have helped as well.   The patient saw a LPN or RN for medicare wellness visit.  Prevention and wellness was reviewed in detail. Note reviewed and important notes copied below. Health Maintenance:  Flu- due Tdap- insurance Abnormal Screenings: none   05/05/20  Hypertension:    Good control on losartan HCTZ BP Readings from Last 3 Encounters:  05/05/20 120/60  04/26/20 123/73  02/25/20 132/66  Using medication without problems or lightheadedness:  none Chest pain with exertion: none Edema:none Short of breath:none Average home BPs: Other issues:  Elevated Cholesterol:  Good  Control on atorvastatin Lab Results  Component Value Date   CHOL 166 04/28/2020   HDL 61.90 04/28/2020   LDLCALC 75 04/28/2020   TRIG 144.0 04/28/2020   CHOLHDL 3 04/28/2020  Using medications without problems:none Muscle aches: none Diet compliance: moderate Exercise:  Walking 5-6 times a day with dog Other complaints:  Hx of endometrial cancer Grade 1 endometrial cancer She then underwent a RTLH BSO BPLND on 04/14/82 without complications. Her final pathologic diagnosis is a Stage 1A Grade 1 endometrioid endometrial cancer with no lymphovascular space invasion, no myometrial invasion and negative lymph nodes.  GYN ONC: Dr. Syble [place].Marland Kitchen Dismissed after year 5. GYN is Dr. Nori Riis   Hx  of depression    Clinical Support from 04/26/2020 in [place] at [facility] Total Score 0    On effexor for menopausal syndrome.Now off estrogen.  TAH 7 years ago.   This visit occurred during the SARS-CoV-2 public health emergency.  Safety protocols were in place, including screening questions prior to the visit, additional usage of staff PPE, and extensive cleaning of exam room while observing appropriate contact time as indicated for disinfecting solutions.   COVID 19 screen:  No recent travel or known exposure to COVID19 The patient denies respiratory symptoms of COVID 19 at this time. The importance of social distancing was discussed today.     Review of Systems  Constitutional: Negative for chills and fever.  HENT: Negative for congestion and ear pain.   Eyes: Negative for pain and redness.  Respiratory: Negative for cough and shortness of breath.   Cardiovascular: Negative for chest pain, palpitations and leg swelling.  Gastrointestinal: Negative for abdominal pain, blood in stool, constipation, diarrhea, nausea and vomiting.  Genitourinary: Negative for dysuria.  Musculoskeletal: Negative for falls and myalgias.  Skin: Negative for rash.  Neurological: Negative for dizziness.  Psychiatric/Behavioral: Negative for depression. The patient is not nervous/anxious.       Past Medical History:  Diagnosis Date  . Arthritis    spinal stenosis--back pain--hx of herniated cervical disk- but no surgery  . Asthma   . Cold 10/03/12   pt getting over a cold--still has some head congestion, slight cough  . Colon polyps   . Endometrial ca ([place])   .  H/O: hematuria    benign essential hematuria - per urology work up   . History of depression   . History of pneumonia   . Hypertension   . Numbness in right leg    pt. states occassional in right leg and right big toe from disc herniations  . PONV (postoperative nausea and vomiting)    with prior hip replacement   . Stress incontinence   . Wears glasses     reports that she has never smoked. She has never used smokeless tobacco. She reports current alcohol use of about 3.0 standard drinks of alcohol per week. She reports that she does not use drugs.   Current Outpatient Medications:  .  albuterol (PROVENTIL HFA;VENTOLIN HFA) 108 (90 Base) MCG/ACT inhaler, Inhale 2 puffs into the lungs every 6 (six) hours as needed for wheezing or shortness of breath., Disp: 1 Inhaler, Rfl: 2 .  atorvastatin (LIPITOR) 10 MG tablet, TAKE 1 TABLET BY MOUTH ONCE DAILY, Disp: 90 tablet, Rfl: 0 .  Calcium Carb-Cholecalciferol (CALCIUM 600+D) 600-800 MG-UNIT TABS, Take 1 tablet by mouth daily., Disp: , Rfl:  .  fexofenadine (ALLEGRA) 180 MG tablet, Take 180 mg by mouth daily as needed for allergies or rhinitis., Disp: , Rfl:  .  ibuprofen (ADVIL,MOTRIN) 200 MG tablet, Take 600 mg by mouth every 8 (eight) hours as needed., Disp: , Rfl:  .  Lactobacillus Rhamnosus, GG, (CULTURELLE) CAPS, Take 1 capsule by mouth daily., Disp: , Rfl:  .  losartan-hydrochlorothiazide (HYZAAR) 100-25 MG tablet, TAKE 1 TABLET BY MOUTH DAILY., Disp: 90 tablet, Rfl: 0 .  magnesium gluconate (MAGONATE) 500 MG tablet, Take 500 mg by mouth daily., Disp: , Rfl:  .  Multiple Vitamin (MULTIVITAMIN) tablet, Take 1 tablet by mouth daily., Disp: , Rfl:  .  Polyethylene Glycol 3350 (MIRALAX PO), Take 17 g by mouth as needed. , Disp: , Rfl:  .  Triamcinolone Acetonide (NASACORT ALLERGY 24HR NA), Place 1 spray into the nose as needed. , Disp: , Rfl:  .  venlafaxine XR (EFFEXOR-XR) 75 MG 24 hr capsule, Take 1 capsule (75 mg total) by mouth at bedtime., Disp: 30 capsule, Rfl: 3   Observations/Objective: Blood pressure 120/60, pulse (!) 107, temperature 98.2 F (36.8 C), temperature source Temporal, height 5\' 7"  (1.702 m), weight 155 lb 12 oz (70.6 kg), SpO2 97 %.  Physical Exam Constitutional:      General: She is not in acute distress.    Appearance: Normal  appearance. She is well-developed. She is not ill-appearing or toxic-appearing.  HENT:     Head: Normocephalic.     Right Ear: Hearing, tympanic membrane, ear canal and external ear normal.     Left Ear: Hearing, tympanic membrane, ear canal and external ear normal.     Nose: Nose normal.  Eyes:     General: Lids are normal. Lids are everted, no foreign bodies appreciated.     Conjunctiva/sclera: Conjunctivae normal.     Pupils: Pupils are equal, round, and reactive to light.  Neck:     Thyroid: No thyroid mass or thyromegaly.     Vascular: No carotid bruit.     Trachea: Trachea normal.     Comments: No vertebral ttp, neg spurling, ttp focally above shoulder blade   possibly slight atrophy in left thenar eminence. Cardiovascular:     Rate and Rhythm: Normal rate and regular rhythm.     Heart sounds: Normal heart sounds, S1 normal and S2 normal. No murmur heard.  No  gallop.   Pulmonary:     Effort: Pulmonary effort is normal. No respiratory distress.     Breath sounds: Normal breath sounds. No wheezing, rhonchi or rales.  Abdominal:     General: Bowel sounds are normal. There is no distension or abdominal bruit.     Palpations: Abdomen is soft. There is no fluid wave or mass.     Tenderness: There is no abdominal tenderness. There is no guarding or rebound.     Hernia: No hernia is present.  Musculoskeletal:     Cervical back: Normal range of motion and neck supple. No pain with movement. Normal range of motion.  Lymphadenopathy:     Cervical: No cervical adenopathy.  Skin:    General: Skin is warm and dry.     Findings: No rash.  Neurological:     Mental Status: She is alert.     Cranial Nerves: No cranial nerve deficit.     Sensory: No sensory deficit.  Psychiatric:        Mood and Affect: Mood is not anxious or depressed.        Speech: Speech normal.        Behavior: Behavior normal. Behavior is cooperative.        Judgment: Judgment normal.      Assessment and  Plan   The patient's preventative maintenance and recommended screening tests for an annual wellness exam were reviewed in full today. Brought up to date unless services declined.  Counselled on the importance of diet, exercise, and its role in overall health and mortality. The patient's FH and SH was reviewed, including their home life, tobacco status, and drug and alcohol status.   Vaccines:uptodate with PNA, flu given today. Nonsmoker  S/P COVID series x 2 .. plan booster Mammo: nml in 09/2019, repeat q1years given endometrial cancer DVE/PAP: Sees Dr.Neal once a year.  Pap 01/2019... GYN recs every 2 years. Colon: Colonoscopy 07/2015, hx of polyps, repeat in 5 years. 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 SE of constipation. StoppedFosamax after surgery.Repeat DXA 12/2015 NORMAL on no med. Repeat in 2022 HCV neg in 2014.  HYPERCHOLESTEROLEMIA Well controlled. Continue current medication.   ESSENTIAL HYPERTENSION, BENIGN Well controlled. Continue current medication.   Acute neck pain Likely resolving cervical radiculopathy.  Treat with heat, refilled meloxicam. No indication for X-ray ( no focal vertebral pain or falls)    Eliezer Lofts, MD

## 2020-05-05 NOTE — Patient Instructions (Addendum)
Can use meloxicam for joint pain.  Call if neck issue not continuing to improve.  Consider Shingrix and third dose COVID.

## 2020-05-05 NOTE — Assessment & Plan Note (Signed)
Likely resolving cervical radiculopathy.  Treat with heat, refilled meloxicam. No indication for X-ray ( no focal vertebral pain or falls)

## 2020-05-26 NOTE — Assessment & Plan Note (Signed)
Sees GYN Dr. Nori Riis

## 2020-05-31 ENCOUNTER — Ambulatory Visit (INDEPENDENT_AMBULATORY_CARE_PROVIDER_SITE_OTHER): Payer: PPO | Admitting: Family Medicine

## 2020-05-31 ENCOUNTER — Other Ambulatory Visit: Payer: Self-pay

## 2020-05-31 ENCOUNTER — Encounter: Payer: Self-pay | Admitting: Family Medicine

## 2020-05-31 VITALS — BP 140/70 | HR 94 | Temp 98.2°F | Ht 67.0 in | Wt 159.0 lb

## 2020-05-31 DIAGNOSIS — T50B95A Adverse effect of other viral vaccines, initial encounter: Secondary | ICD-10-CM | POA: Diagnosis not present

## 2020-05-31 DIAGNOSIS — I499 Cardiac arrhythmia, unspecified: Secondary | ICD-10-CM

## 2020-05-31 LAB — CBC WITH DIFFERENTIAL/PLATELET
Basophils Absolute: 0.1 10*3/uL (ref 0.0–0.1)
Basophils Relative: 0.8 % (ref 0.0–3.0)
Eosinophils Absolute: 0.3 10*3/uL (ref 0.0–0.7)
Eosinophils Relative: 3.8 % (ref 0.0–5.0)
HCT: 39 % (ref 36.0–46.0)
Hemoglobin: 13.3 g/dL (ref 12.0–15.0)
Lymphocytes Relative: 32.8 % (ref 12.0–46.0)
Lymphs Abs: 2.5 10*3/uL (ref 0.7–4.0)
MCHC: 34.1 g/dL (ref 30.0–36.0)
MCV: 90.7 fl (ref 78.0–100.0)
Monocytes Absolute: 0.7 10*3/uL (ref 0.1–1.0)
Monocytes Relative: 8.7 % (ref 3.0–12.0)
Neutro Abs: 4 10*3/uL (ref 1.4–7.7)
Neutrophils Relative %: 53.9 % (ref 43.0–77.0)
Platelets: 233 10*3/uL (ref 150.0–400.0)
RBC: 4.3 Mil/uL (ref 3.87–5.11)
RDW: 14.2 % (ref 11.5–15.5)
WBC: 7.5 10*3/uL (ref 4.0–10.5)

## 2020-05-31 LAB — TSH: TSH: 0.94 u[IU]/mL (ref 0.35–4.50)

## 2020-05-31 LAB — TROPONIN I (HIGH SENSITIVITY): High Sens Troponin I: 4 ng/L (ref 2–17)

## 2020-05-31 MED FILL — VENLAFAXINE HCL ER 75 MG CA: 75 | 90 days supply | Qty: 90 | Fill #1

## 2020-05-31 NOTE — Assessment & Plan Note (Signed)
EKG shows tachycardia and frequent ectopic ventricular beats.     Eval for other possible causes of tachycardia with labs although unusual symptoms started day of vaccine.. likely SE of vaccine.  Pt denies anxiety or new meds.   Send for ECHo and refer to cardiology as it has continued to be irregualr now for > 10 days.

## 2020-05-31 NOTE — Assessment & Plan Note (Signed)
No clear s/s of anapylaxsis.  Considerations include myocarditis, thombocytopenia, clot and PE. No clear sign of clot or PE.   eval with cbc and ECHO.

## 2020-05-31 NOTE — Progress Notes (Signed)
Chief Complaint  Patient presents with  . Irregular Heart Beat    History of Present Illness: HPI  69 year old female with history of HTN, and asthma present with new onset irregular pulse.   She reports  That she was in her normal state of health until 10/13 when she received her 3rd COVID booster shot. She reports 4 hours after the dose she felt tired and noted pause and slow heart rate in  50s. No associated chest pain SOB, no lightheadedness.  Rate return to her normal rate 80-90s but still seems irregular and pulse feels irregular. Occurring constantly.  No S/S of thrombus. No pain or swelling in legs  Still no chest pressure or pain or SOB, just hyper awareness of heart.   No new meds.   Last labs unremarkable CMET and lipids in 04/28/2020. Hx of occ palpitations.   This visit occurred during the SARS-CoV-2 public health emergency.  Safety protocols were in place, including screening questions prior to the visit, additional usage of staff PPE, and extensive cleaning of exam room while observing appropriate contact time as indicated for disinfecting solutions.   COVID 19 screen:  No recent travel or known exposure to COVID19 The patient denies respiratory symptoms of COVID 19 at this time. The importance of social distancing was discussed today.     Review of Systems  Constitutional: Negative for chills and fever.  HENT: Negative for congestion and ear pain.   Eyes: Negative for pain and redness.  Respiratory: Negative for cough and shortness of breath.   Cardiovascular: Positive for palpitations. Negative for chest pain and leg swelling.  Gastrointestinal: Negative for abdominal pain, blood in stool, constipation, diarrhea, nausea and vomiting.  Genitourinary: Negative for dysuria.  Musculoskeletal: Negative for falls and myalgias.  Skin: Negative for rash.  Neurological: Negative for dizziness.  Psychiatric/Behavioral: Negative for depression. The patient is not  nervous/anxious.       Past Medical History:  Diagnosis Date  . Arthritis    spinal stenosis--back pain--hx of herniated cervical disk- but no surgery  . Asthma   . Cold 10/03/12   pt getting over a cold--still has some head congestion, slight cough  . Colon polyps   . Endometrial ca ([place])   . H/O: hematuria    benign essential hematuria - per urology work up   . History of depression   . History of pneumonia   . Hypertension   . Numbness in right leg    pt. states occassional in right leg and right big toe from disc herniations  . PONV (postoperative nausea and vomiting)    with prior hip replacement  . Stress incontinence   . Wears glasses     reports that she has never smoked. She has never used smokeless tobacco. She reports current alcohol use of about 3.0 standard drinks of alcohol per week. She reports that she does not use drugs.   Current Outpatient Medications:  .  albuterol (PROVENTIL HFA;VENTOLIN HFA) 108 (90 Base) MCG/ACT inhaler, Inhale 2 puffs into the lungs every 6 (six) hours as needed for wheezing or shortness of breath., Disp: 1 Inhaler, Rfl: 2 .  atorvastatin (LIPITOR) 10 MG tablet, TAKE 1 TABLET BY MOUTH ONCE DAILY, Disp: 90 tablet, Rfl: 0 .  Calcium Carb-Cholecalciferol (CALCIUM 600+D) 600-800 MG-UNIT TABS, Take 1 tablet by mouth daily., Disp: , Rfl:  .  fexofenadine (ALLEGRA) 180 MG tablet, Take 180 mg by mouth daily as needed for allergies or rhinitis., Disp: ,  Rfl:  .  ibuprofen (ADVIL,MOTRIN) 200 MG tablet, Take 600 mg by mouth every 8 (eight) hours as needed., Disp: , Rfl:  .  Lactobacillus Rhamnosus, GG, (CULTURELLE) CAPS, Take 1 capsule by mouth daily., Disp: , Rfl:  .  losartan-hydrochlorothiazide (HYZAAR) 100-25 MG tablet, TAKE 1 TABLET BY MOUTH DAILY., Disp: 90 tablet, Rfl: 0 .  magnesium gluconate (MAGONATE) 500 MG tablet, Take 500 mg by mouth daily., Disp: , Rfl:  .  meloxicam (MOBIC) 15 MG tablet, Take 1 tablet (15 mg total) by mouth daily., Disp:  30 tablet, Rfl: 0 .  Multiple Vitamin (MULTIVITAMIN) tablet, Take 1 tablet by mouth daily., Disp: , Rfl:  .  Polyethylene Glycol 3350 (MIRALAX PO), Take 17 g by mouth as needed. , Disp: , Rfl:  .  Triamcinolone Acetonide (NASACORT ALLERGY 24HR NA), Place 1 spray into the nose as needed. , Disp: , Rfl:  .  venlafaxine XR (EFFEXOR-XR) 75 MG 24 hr capsule, Take 1 capsule (75 mg total) by mouth at bedtime., Disp: 30 capsule, Rfl: 3   Observations/Objective: Blood pressure 140/70, pulse 94, temperature 98.2 F (36.8 C), temperature source Temporal, height 5\' 7"  (1.702 m), weight 159 lb (72.1 kg), SpO2 99 %.  Physical Exam Constitutional:      General: She is not in acute distress.    Appearance: Normal appearance. She is well-developed. She is not ill-appearing or toxic-appearing.  HENT:     Head: Normocephalic.     Right Ear: Hearing, tympanic membrane, ear canal and external ear normal. Tympanic membrane is not erythematous, retracted or bulging.     Left Ear: Hearing, tympanic membrane, ear canal and external ear normal. Tympanic membrane is not erythematous, retracted or bulging.     Nose: No mucosal edema or rhinorrhea.     Right Sinus: No maxillary sinus tenderness or frontal sinus tenderness.     Left Sinus: No maxillary sinus tenderness or frontal sinus tenderness.     Mouth/Throat:     Pharynx: Uvula midline.  Eyes:     General: Lids are normal. Lids are everted, no foreign bodies appreciated.     Conjunctiva/sclera: Conjunctivae normal.     Pupils: Pupils are equal, round, and reactive to light.  Neck:     Thyroid: No thyroid mass or thyromegaly.     Vascular: No carotid bruit.     Trachea: Trachea normal.  Cardiovascular:     Rate and Rhythm: Tachycardia present. Rhythm regularly irregular.     Chest Wall: PMI is not displaced. No thrill.     Pulses: Normal pulses.     Heart sounds: Normal heart sounds, S1 normal and S2 normal. No murmur heard.  No friction rub. No gallop.    Pulmonary:     Effort: Pulmonary effort is normal. No tachypnea or respiratory distress.     Breath sounds: Normal breath sounds. No decreased breath sounds, wheezing, rhonchi or rales.  Abdominal:     General: Bowel sounds are normal.     Palpations: Abdomen is soft.     Tenderness: There is no abdominal tenderness.  Musculoskeletal:     Cervical back: Normal range of motion and neck supple.     Right lower leg: No edema.     Left lower leg: No edema.  Skin:    General: Skin is warm and dry.     Findings: No rash.  Neurological:     Mental Status: She is alert.  Psychiatric:        Mood  and Affect: Mood is not anxious or depressed.        Speech: Speech normal.        Behavior: Behavior normal. Behavior is cooperative.        Thought Content: Thought content normal.        Judgment: Judgment normal.      Assessment and Plan   Adverse effect of COVID-19 vaccine No clear s/s of anapylaxsis.  Considerations include myocarditis, thombocytopenia, clot and PE. No clear sign of clot or PE.   eval with cbc and ECHO.  Irregular heartbeat EKG shows tachycardia and frequent ectopic ventricular beats.     Eval for other possible causes of tachycardia with labs although unusual symptoms started day of vaccine.. likely SE of vaccine.  Pt denies anxiety or new meds.   Send for ECHo and refer to cardiology as it has continued to be irregualr now for > 10 days.     Eliezer Lofts, MD

## 2020-05-31 NOTE — Addendum Note (Signed)
Addended by: Ellamae Sia on: 05/31/2020 09:06 AM   Modules accepted: Orders

## 2020-05-31 NOTE — Patient Instructions (Addendum)
Please stop at the lab to have labs drawn. We will call to set up ECHO and cardiology will call to set up referral.  GO to ER if chest pain or shortness of breath.

## 2020-06-10 ENCOUNTER — Ambulatory Visit
Admission: RE | Admit: 2020-06-10 | Discharge: 2020-06-10 | Disposition: A | Discharge status: Home / Self Care | Payer: PPO | Source: Ambulatory Visit | Attending: Family Medicine | Admitting: Family Medicine

## 2020-06-10 ENCOUNTER — Other Ambulatory Visit: Payer: Self-pay

## 2020-06-10 DIAGNOSIS — I340 Nonrheumatic mitral (valve) insufficiency: Secondary | ICD-10-CM | POA: Insufficient documentation

## 2020-06-10 DIAGNOSIS — I499 Cardiac arrhythmia, unspecified: Secondary | ICD-10-CM

## 2020-06-10 DIAGNOSIS — R012 Other cardiac sounds: Secondary | ICD-10-CM | POA: Diagnosis not present

## 2020-06-10 DIAGNOSIS — T50B95A Adverse effect of other viral vaccines, initial encounter: Secondary | ICD-10-CM

## 2020-06-10 LAB — ECHOCARDIOGRAM COMPLETE
AR max vel: 1.93 cm2
AV Area VTI: 2 cm2
AV Area mean vel: 1.7 cm2
AV Mean grad: 4.5 mmHg
AV Peak grad: 7.4 mmHg
Ao pk vel: 1.36 m/s
Area-P 1/2: 3.99 cm2
S' Lateral: 2.49 cm

## 2020-06-10 NOTE — Progress Notes (Signed)
*  PRELIMINARY RESULTS* Echocardiogram 2D Echocardiogram has been performed.  Sherrie Sport 06/10/2020, 10:15 AM

## 2020-06-16 ENCOUNTER — Other Ambulatory Visit: Payer: Self-pay | Admitting: Family Medicine

## 2020-06-16 MED FILL — ATORVASTATIN CALCIUM 10 MG: 10 | 90 days supply | Qty: 90 | Fill #0

## 2020-06-17 ENCOUNTER — Telehealth: Payer: Self-pay | Admitting: Radiology

## 2020-06-17 ENCOUNTER — Other Ambulatory Visit: Payer: Self-pay | Admitting: Cardiology

## 2020-06-17 ENCOUNTER — Other Ambulatory Visit: Payer: Self-pay

## 2020-06-17 ENCOUNTER — Encounter: Payer: Self-pay | Admitting: Cardiology

## 2020-06-17 ENCOUNTER — Ambulatory Visit: Payer: PPO | Admitting: Cardiology

## 2020-06-17 VITALS — BP 122/60 | HR 109 | Ht 67.0 in | Wt 157.4 lb

## 2020-06-17 DIAGNOSIS — I493 Ventricular premature depolarization: Secondary | ICD-10-CM

## 2020-06-17 DIAGNOSIS — I10 Essential (primary) hypertension: Secondary | ICD-10-CM

## 2020-06-17 LAB — MAGNESIUM: Magnesium: 2.1 mg/dL (ref 1.6–2.3)

## 2020-06-17 LAB — BASIC METABOLIC PANEL
BUN/Creatinine Ratio: 28 (ref 12–28)
BUN: 22 mg/dL (ref 8–27)
CO2: 26 mmol/L (ref 20–29)
Calcium: 9.8 mg/dL (ref 8.7–10.3)
Chloride: 98 mmol/L (ref 96–106)
Creatinine, Ser: 0.79 mg/dL (ref 0.57–1.00)
GFR calc Af Amer: 88 mL/min/{1.73_m2} (ref >59)
GFR calc non Af Amer: 76 mL/min/{1.73_m2} (ref >59)
Glucose: 116 mg/dL — ABNORMAL HIGH (ref 65–99)
Potassium: 3.9 mmol/L (ref 3.5–5.2)
Sodium: 138 mmol/L (ref 134–144)

## 2020-06-17 MED ORDER — METOPROLOL SUCCINATE ER 25 MG PO TB24: 25.0000 mg | ORAL_TABLET | Freq: Every day | ORAL | 3 refills | Status: DC

## 2020-06-17 MED FILL — METOPROLOL SUCCINATE ER 25: 25 | 90 days supply | Qty: 90 | Fill #0

## 2020-06-17 NOTE — Addendum Note (Signed)
Addended by: Antonieta Iba on: 06/17/2020 10:34 AM   Modules accepted: Orders

## 2020-06-17 NOTE — Telephone Encounter (Signed)
Enrolled patient for a 14 day Zio XT  monitor to be mailed to patients home  °

## 2020-06-17 NOTE — Patient Instructions (Signed)
Medication Instructions:  Your physician has recommended you make the following change in your medication:  1) START taking Toprol XL (metoprolol succinate) 25 mg daily  *If you need a refill on your cardiac medications before your next appointment, please call your pharmacy*   Lab Work: TODAY: BMET, Mg If you have labs (blood work) drawn today and your tests are completely normal, you will receive your results only by: Marland Kitchen MyChart Message (if you have MyChart) OR . A paper copy in the mail If you have any lab test that is abnormal or we need to change your treatment, we will call you to review the results.   Testing/Procedures: Your physician has recommended that you wear an event monitor. Event monitors are medical devices that record the heart's electrical activity. Doctors most often [place] these monitors to diagnose arrhythmias. Arrhythmias are problems with the speed or rhythm of the heartbeat. The monitor is a small, portable device. You can wear one while you do your normal daily activities. This is usually used to diagnose what is causing palpitations/syncope (passing out).  Your physician has requested that you have a lexiscan myoview. For further information please visit HugeFiesta.[email or web address]. Please follow instruction sheet, as given.  Your physician has requested that you have a cardiac MRI. Cardiac MRI uses a computer to create images of your heart as its beating, producing both still and moving pictures of your heart and major blood vessels. For further information please visit [email or web address] Please follow the instruction sheet given to you today for more information.  Follow-Up: At [facility], you and your health needs are our priority.  As part of our continuing mission to provide you with exceptional heart care, we have created designated Provider Care Teams.  These Care Teams include your primary Cardiologist (physician) and Advanced Practice Providers (APPs -  Physician  Assistants and Nurse Practitioners) who all work together to provide you with the care you need, when you need it.  Your next appointment:   4 week(s)  The format for your next appointment:   In Person  Provider:   You may see Fransico Him, MD or one of the following Advanced Practice Providers on your designated Care Team:    Melina Copa, PA-C  Ermalinda Barrios, PA-C    Other Instructions Bryn Gulling- Long Term Monitor Instructions   Your physician has requested you wear your ZIO patch monitor_14_days.   This is a single patch monitor.  [employer] supplies one patch monitor per enrollment.  Additional stickers are not available.   Please do not apply patch if you will be having a Nuclear Stress Test, Echocardiogram, Cardiac CT, MRI, or Chest Xray during the time frame you would be wearing the monitor. The patch cannot be worn during these tests.  You cannot remove and re-apply the ZIO XT patch monitor.   Your ZIO patch monitor will be sent USPS Priority mail from [facility] directly to your home address. The monitor may also be mailed to a PO BOX if home delivery is not available.   It may take 3-5 days to receive your monitor after you have been enrolled.   Once you have received you monitor, please review enclosed instructions.  Your monitor has already been registered assigning a specific monitor serial # to you.   Applying the monitor   Shave hair from upper left chest.   Hold abrader disc by orange tab.  Rub abrader in 40 strokes over left upper chest as indicated in your monitor  instructions.   Clean area with 4 enclosed alcohol pads .  Use all pads to assure are is cleaned thoroughly.  Let dry.   Apply patch as indicated in monitor instructions.  Patch will be place under collarbone on left side of chest with arrow pointing upward.   Rub patch adhesive wings for 2 minutes.Remove white label marked "1".  Remove white label marked "2".  Rub patch adhesive wings for 2  additional minutes.   While looking in a mirror, press and release button in center of patch.  A small green light will flash 3-4 times .  This will be your only indicator the monitor has been turned on.     Do not shower for the first 24 hours.  You may shower after the first 24 hours.   Press button if you feel a symptom. You will hear a small click.  Record Date, Time and Symptom in the Patient Log Book.   When you are ready to remove patch, follow instructions on last 2 pages of Patient Log Book.  Stick patch monitor onto last page of Patient Log Book.   Place Patient Log Book in [place] box.  Use locking tab on box and tape box closed securely.  The [employer] and [employer] has [employer] on it.  Please place in mailbox as soon as possible.  Your physician should have your test results approximately 7 days after the monitor has been mailed back to [facility].   Call [place] at [phone] if you have questions regarding your ZIO XT patch monitor.  Call them immediately if you see an orange light blinking on your monitor.   If your monitor falls off in less than 4 days contact our Monitor department at [phone].  If your monitor becomes loose or falls off after 4 days call [employer] at [phone] for suggestions on securing your monitor.

## 2020-06-17 NOTE — Progress Notes (Signed)
Cardiology Consult  Note    Date:  06/17/2020   ID:  Christina Griffith, DOB 1951/06/26, MRN [id number]  PCP:  Jinny Sanders, MD  Cardiologist:  Fransico Him, MD   Chief Complaint  Patient presents with  . New Patient (Initial Visit)    Palpitations    History of Present Illness:  Christina Griffith is a 69 y.o. female who is being seen today for the evaluation of palpitations at the request of Bedsole, Amy E, MD.  This is a 69yo female with a hx of asthma and HTN who recently developed palpitations.  She says that for the past month she has been having skipped irregular heart beats.  She tells me that she got her COVID 19 booster on Oct 9th and had been feeling fine.  About 4 hours after the booster, she took her BP and HR and her HR wsa 50-60bpm and normally is 90bpm.  She also noticed that her pulse was irregular. The next week she called her PCP and again waited a few days to see if it would go away.    She went to her PCP about 10 days after the shot and showed PVCs.  She had an echo that showed low normal LVF with EF 50-55% with G1DD, mildly enlarged RA and LA, mild MR.  Labwork in Sept and Oct was normal including TSH and Hbg.   She is now referred for evaluation of PVCs.  She has some mild tenderness to palpation over the sternum but no exertional chest pain or pressure.  She denies any SOB, DOE (except going up steep hills), PND, orthopnea, LE edema, dizziness or syncope.  She walks several times a day with no problems.    Past Medical History:  Diagnosis Date  . Arthritis    spinal stenosis--back pain--hx of herniated cervical disk- but no surgery  . Asthma   . Cold 10/03/12   pt getting over a cold--still has some head congestion, slight cough  . Colon polyps   . Endometrial ca ([place])   . H/O: hematuria    benign essential hematuria - per urology work up   . History of depression   . History of pneumonia   . Hypertension   . Numbness in right leg    pt. states  occassional in right leg and right big toe from disc herniations  . PONV (postoperative nausea and vomiting)    with prior hip replacement  . Stress incontinence   . Wears glasses     Past Surgical History:  Procedure Laterality Date  . BREAST SURGERY Right 1980's   breast biopsy  . COLONOSCOPY    . DENTAL SURGERY  11/2019  . EYE SURGERY Bilateral 02/2015   cataracts  . HYSTEROSCOPY WITH D & C Left 1/24/204   w/ resection of Endometrial polyps  . ROBOTIC ASSISTED TOTAL HYSTERECTOMY WITH BILATERAL SALPINGO OOPHERECTOMY N/A 10/07/2012   Procedure: ROBOTIC ASSISTED TOTAL HYSTERECTOMY WITH BILATERAL SALPINGO OOPHORECTOMY/POSSIBLE LYMPH NODE BIOPSY;  Surgeon: Imagene Gurney A. Alycia Rossetti, MD;  Location: [facility];  Service: Gynecology;  Laterality: N/A;  . TOTAL HIP ARTHROPLASTY     right 2002 left 2004  . TOTAL HIP REVISION Right 05/09/2015   Procedure: TOTAL HIP REVISION;  Surgeon: Frederik Pear, MD;  Location: [facility];  Service: Orthopedics;  Laterality: Right;    Current Medications: Current Meds  Medication Sig  . albuterol (PROVENTIL HFA;VENTOLIN HFA) 108 (90 Base) MCG/ACT inhaler Inhale 2 puffs into the lungs every  6 (six) hours as needed for wheezing or shortness of breath.  Marland Kitchen atorvastatin (LIPITOR) 10 MG tablet TAKE 1 TABLET BY MOUTH ONCE DAILY  . Calcium Carb-Cholecalciferol (CALCIUM 600+D) 600-800 MG-UNIT TABS Take 1 tablet by mouth daily.  . fexofenadine (ALLEGRA) 180 MG tablet Take 180 mg by mouth daily as needed for allergies or rhinitis.  Marland Kitchen ibuprofen (ADVIL,MOTRIN) 200 MG tablet Take 600 mg by mouth every 8 (eight) hours as needed.  Marland Kitchen losartan-hydrochlorothiazide (HYZAAR) 100-25 MG tablet TAKE 1 TABLET BY MOUTH DAILY.  . magnesium gluconate (MAGONATE) 500 MG tablet Take 500 mg by mouth daily.  . meloxicam (MOBIC) 15 MG tablet Take 1 tablet (15 mg total) by mouth daily.  . Multiple Vitamin (MULTIVITAMIN) tablet Take 1 tablet by mouth daily.  . Polyethylene Glycol 3350 (MIRALAX PO) Take 17 g by  mouth as needed.   . Triamcinolone Acetonide (NASACORT ALLERGY 24HR NA) Place 1 spray into the nose as needed.   . venlafaxine XR (EFFEXOR-XR) 75 MG 24 hr capsule Take 1 capsule (75 mg total) by mouth at bedtime.    Allergies:   Patient has no known allergies.   Social History   Socioeconomic History  . Marital status: Single    Spouse name: Not on file  . Number of children: Not on file  . Years of education: Not on file  . Highest education level: Not on file  Occupational History  . Occupation: Counselling psychologist: [employer]  Tobacco Use  . Smoking status: Never Smoker  . Smokeless tobacco: Never Used  Vaping Use  . Vaping Use: Never used  Substance and Sexual Activity  . Alcohol use: Yes    Alcohol/week: 3.0 standard drinks    Types: 3 Glasses of wine per week    Comment: 3 x a week  . Drug use: No  . Sexual activity: Not on file  Other Topics Concern  . Not on file  Social History Narrative   Regular exercise- yes, 5-6 days a week, yoga   Diet: healthy   Social Determinants of Health   Financial Resource Strain: Low Risk   . Difficulty of Paying Living Expenses: Not hard at all  Food Insecurity: No Food Insecurity  . Worried About Charity fundraiser in the Last Year: Never true  . Ran Out of Food in the Last Year: Never true  Transportation Needs: No Transportation Needs  . Lack of Transportation (Medical): No  . Lack of Transportation (Non-Medical): No  Physical Activity: Sufficiently Active  . Days of Exercise per Week: 5 days  . Minutes of Exercise per Session: 30 min  Stress: No Stress Concern Present  . Feeling of Stress : Only a little  Social Connections:   . Frequency of Communication with Friends and Family: Not on file  . Frequency of Social Gatherings with Friends and Family: Not on file  . Attends Religious Services: Not on file  . Active Member of Clubs or Organizations: Not on file  . Attends Archivist  Meetings: Not on file  . Marital Status: Not on file     Family History:  The patient's family history includes Breast cancer in her cousin; Cancer in her father and maternal grandmother; Hyperlipidemia in her father; Hypertension in her mother.   ROS:   Please see the history of present illness.    ROS All other systems reviewed and are negative.  No flowsheet data found.  PHYSICAL EXAM:   VS:  BP 122/60   Pulse (!) 109   Ht 5\' 7"  (1.702 m)   Wt 157 lb 6.4 oz (71.4 kg)   SpO2 99%   BMI 24.65 kg/m    GEN: Well nourished, well developed, in no acute distress  HEENT: normal  Neck: no JVD, carotid bruits, or masses Cardiac: RRR; no murmurs, rubs, or gallops,no edema.  Intact distal pulses bilaterally.  Respiratory:  clear to auscultation bilaterally, normal work of breathing GI: soft, nontender, nondistended, + BS MS: no deformity or atrophy  Skin: warm and dry, no rash Neuro:  Alert and Oriented x 3, Strength and sensation are intact Psych: euthymic mood, full affect  Wt Readings from Last 3 Encounters:  06/17/20 157 lb 6.4 oz (71.4 kg)  05/31/20 159 lb (72.1 kg)  05/05/20 155 lb 12 oz (70.6 kg)      Studies/Labs Reviewed:   EKG:  EKG is was ordered today and showed ST with trigeminal PVCs and nonspecific T wave abnormality.   Recent Labs: 04/28/2020: ALT 24; BUN 20; Creatinine, Ser 0.78; Potassium 4.0; Sodium 139 05/31/2020: Hemoglobin 13.3; Platelets 233.0; TSH 0.94   Lipid Panel    Component Value Date/Time   CHOL 166 04/28/2020 0806   TRIG 144.0 04/28/2020 0806   HDL 61.90 04/28/2020 0806   CHOLHDL 3 04/28/2020 0806   VLDL 28.8 04/28/2020 0806   LDLCALC 75 04/28/2020 0806      Additional studies/ records that were reviewed today include:  OV notes from PCP, EKG  ASSESSMENT:    1. PVC's (premature ventricular contractions)   2. Essential hypertension, benign      PLAN:  In order of problems listed above:  1. Palpitations/PVCs -EKG today  shows frequent trigeminal PVCs -will get a 2 week ziopatch to assess PVC load -I will get a cardiac MRI with gad to assess for RV dysplasia given RVE on echo and rule out infiltrative disease -TSH and Hbg were normal recently -check Mag and BMET -Lexiscan myoview to rule out ischemia -start Toprol XL 25mg  daily  2.  HTN -Bp borderline controlled on exam today -continue Hyzaar 100-25mg  daily  Followup with me in 4 weeks  Medication Adjustments/Labs and Tests Ordered: Current medicines are reviewed at length with the patient today.  Concerns regarding medicines are outlined above.  Medication changes, Labs and Tests ordered today are listed in the Patient Instructions below.  There are no Patient Instructions on file for this visit.   Signed, Fransico Him, MD  06/17/2020 10:15 AM    [address], [place], [place]  [address] Phone: [phone]; Fax: [phone]

## 2020-06-20 ENCOUNTER — Telehealth: Payer: Self-pay | Admitting: Cardiology

## 2020-06-20 NOTE — Addendum Note (Signed)
Addended by: Gar Ponto on: 06/20/2020 11:48 AM   Modules accepted: Orders

## 2020-06-20 NOTE — Telephone Encounter (Signed)
Left message for patient to call and discuss scheduling the Cardiac MRI ordered by Dr. Radford Pax

## 2020-06-21 ENCOUNTER — Telehealth (HOSPITAL_COMMUNITY): Payer: Self-pay | Admitting: *Deleted

## 2020-06-21 ENCOUNTER — Encounter: Payer: Self-pay | Admitting: Cardiology

## 2020-06-21 NOTE — Telephone Encounter (Signed)
Spoke with patient regarding Cardiac MRI appointment scheduled Wednesday 07/13/20 at 8:00 am at [facility]----arrival time is 7:30 am 1st floor admissions office for check in.  Will mail information to patient and it is also available in My Chart.  Patient voiced her understanding.

## 2020-06-21 NOTE — Telephone Encounter (Signed)
Patient given detailed instructions per Myocardial Perfusion Study Information Sheet for the test on 06/24/20 at 7:30. Patient notified to arrive 15 minutes early and that it is imperative to arrive on time for appointment to keep from having the test rescheduled.  If you need to cancel or reschedule your appointment, please call the office within 24 hours of your appointment. . Patient verbalized understanding.Christina Griffith

## 2020-06-24 ENCOUNTER — Ambulatory Visit (INDEPENDENT_AMBULATORY_CARE_PROVIDER_SITE_OTHER): Payer: PPO

## 2020-06-24 ENCOUNTER — Other Ambulatory Visit: Payer: Self-pay

## 2020-06-24 ENCOUNTER — Ambulatory Visit (HOSPITAL_COMMUNITY): Payer: PPO | Attending: Internal Medicine

## 2020-06-24 DIAGNOSIS — I10 Essential (primary) hypertension: Secondary | ICD-10-CM

## 2020-06-24 DIAGNOSIS — I493 Ventricular premature depolarization: Secondary | ICD-10-CM | POA: Diagnosis not present

## 2020-06-24 LAB — MYOCARDIAL PERFUSION IMAGING
LV dias vol: 80 mL (ref 46–106)
LV sys vol: 32 mL
Peak HR: 113 {beats}/min
Rest HR: 79 {beats}/min
SDS: 1
SRS: 0
SSS: 1
TID: 0.94

## 2020-06-24 MED ORDER — TECHNETIUM TC 99M TETROFOSMIN IV KIT
32.9000 | PACK | Freq: Once | INTRAVENOUS | Status: AC | PRN
  Administered 2020-06-24: 32.9 via INTRAVENOUS
  Filled 2020-06-24: qty 33

## 2020-06-24 MED ORDER — REGADENOSON 0.4 MG/5ML IV SOLN
0.4000 mg | Freq: Once | INTRAVENOUS | Status: AC
  Administered 2020-06-24: 0.4 mg via INTRAVENOUS

## 2020-06-24 MED ORDER — TECHNETIUM TC 99M TETROFOSMIN IV KIT
10.3000 | PACK | Freq: Once | INTRAVENOUS | Status: AC | PRN
  Administered 2020-06-24: 10.3 via INTRAVENOUS
  Filled 2020-06-24: qty 11

## 2020-07-12 ENCOUNTER — Telehealth (HOSPITAL_COMMUNITY): Payer: Self-pay | Admitting: Emergency Medicine

## 2020-07-12 NOTE — Telephone Encounter (Signed)
Reaching out to patient to offer assistance regarding upcoming cardiac imaging study; pt verbalizes understanding of appt date/time, parking situation and where to check in, and verified current allergies; name and call back number provided for further questions should they arise Marchia Bond RN Navigator Cardiac Imaging [facility] [phone] office [phone] cell  Pt denies claustro; reports titanium in bilat hips Christina Griffith

## 2020-07-13 ENCOUNTER — Ambulatory Visit (HOSPITAL_COMMUNITY)
Admission: RE | Admit: 2020-07-13 | Discharge: 2020-07-13 | Disposition: A | Discharge status: Home / Self Care | Payer: PPO | Source: Ambulatory Visit | Attending: Cardiology | Admitting: Cardiology

## 2020-07-13 ENCOUNTER — Other Ambulatory Visit: Payer: Self-pay

## 2020-07-13 DIAGNOSIS — I10 Essential (primary) hypertension: Secondary | ICD-10-CM | POA: Diagnosis not present

## 2020-07-13 DIAGNOSIS — I493 Ventricular premature depolarization: Secondary | ICD-10-CM | POA: Insufficient documentation

## 2020-07-13 MED ORDER — GADOBUTROL 1 MMOL/ML IV SOLN
10.0000 mL | Freq: Once | INTRAVENOUS | Status: AC | PRN
  Administered 2020-07-13: 10 mL via INTRAVENOUS

## 2020-07-18 ENCOUNTER — Ambulatory Visit: Payer: PPO | Admitting: Cardiology

## 2020-07-18 ENCOUNTER — Encounter: Payer: Self-pay | Admitting: Cardiology

## 2020-07-18 ENCOUNTER — Other Ambulatory Visit: Payer: Self-pay

## 2020-07-18 VITALS — BP 120/80 | HR 86 | Ht 67.0 in | Wt 157.8 lb

## 2020-07-18 DIAGNOSIS — I10 Essential (primary) hypertension: Secondary | ICD-10-CM

## 2020-07-18 DIAGNOSIS — I493 Ventricular premature depolarization: Secondary | ICD-10-CM | POA: Diagnosis not present

## 2020-07-18 NOTE — Patient Instructions (Signed)

## 2020-07-18 NOTE — Progress Notes (Signed)
Cardiology Consult  Note    Date:  07/18/2020   ID:  Christina Griffith, DOB [birth year], MRN [id number]  PCP:  Jinny Sanders, MD  Cardiologist:  Fransico Him, MD   Chief Complaint  Patient presents with  . Follow-up    Palpitations     History of Present Illness:  Christina Griffith is a 69 y.o. female  with a hx of asthma and HTN who recently developed palpitations.  This started a few hours after getting her COVID 19 booster on Oct 9th and had been feeling fine.  She went to her PCP about 10 days after the shot and showed PVCs.    She had an echo that showed low normal LVF with EF 50-55% with G1DD, mildly enlarged RA and LA, mild MR.  Labwork in Sept and Oct was normal including TSH and Hbg. She had a Lexiscan myoview done showing no ischemia. She was started on Toprol XL 25mg  daily for suppression of PVCs.  Cardiac MRI showed normal LVF with EF 56% with no LGE, normal RV, mild MR and TR.  2 week ziopatch was ordered but we do not have results for this yet.  She is here today for followup and is doing well.  She denies any chest pain or pressure, SOB, DOE, PND, orthopnea, LE edema, dizziness or syncope. Since she started on Toprol her palpitations have significantly improved. She is compliant with her meds and is tolerating meds with no SE.     Past Medical History:  Diagnosis Date  . Arthritis    spinal stenosis--back pain--hx of herniated cervical disk- but no surgery  . Asthma   . Cold 10/03/12   pt getting over a cold--still has some head congestion, slight cough  . Colon polyps   . Endometrial ca ([place])   . H/O: hematuria    benign essential hematuria - per urology work up   . History of depression   . History of pneumonia   . Hypertension   . Numbness in right leg    pt. states occassional in right leg and right big toe from disc herniations  . PONV (postoperative nausea and vomiting)    with prior hip replacement  . Stress incontinence   . Wears glasses      Past Surgical History:  Procedure Laterality Date  . BREAST SURGERY Right 1980's   breast biopsy  . COLONOSCOPY    . DENTAL SURGERY  11/2019  . EYE SURGERY Bilateral 02/2015   cataracts  . HYSTEROSCOPY WITH D & C Left 1/24/204   w/ resection of Endometrial polyps  . ROBOTIC ASSISTED TOTAL HYSTERECTOMY WITH BILATERAL SALPINGO OOPHERECTOMY N/A 10/07/2012   Procedure: ROBOTIC ASSISTED TOTAL HYSTERECTOMY WITH BILATERAL SALPINGO OOPHORECTOMY/POSSIBLE LYMPH NODE BIOPSY;  Surgeon: Imagene Gurney A. Alycia Rossetti, MD;  Location: [facility];  Service: Gynecology;  Laterality: N/A;  . TOTAL HIP ARTHROPLASTY     right 2002 left 2004  . TOTAL HIP REVISION Right 05/09/2015   Procedure: TOTAL HIP REVISION;  Surgeon: Frederik Pear, MD;  Location: [facility];  Service: Orthopedics;  Laterality: Right;    Current Medications: Current Meds  Medication Sig  . albuterol (PROVENTIL HFA;VENTOLIN HFA) 108 (90 Base) MCG/ACT inhaler Inhale 2 puffs into the lungs every 6 (six) hours as needed for wheezing or shortness of breath.  Marland Kitchen atorvastatin (LIPITOR) 10 MG tablet TAKE 1 TABLET BY MOUTH ONCE DAILY  . Calcium Carb-Cholecalciferol 600-800 MG-UNIT TABS Take 1 tablet by mouth daily.  . fexofenadine (  ALLEGRA) 180 MG tablet Take 180 mg by mouth daily as needed for allergies or rhinitis.  Marland Kitchen ibuprofen (ADVIL,MOTRIN) 200 MG tablet Take 600 mg by mouth every 8 (eight) hours as needed.  Marland Kitchen losartan-hydrochlorothiazide (HYZAAR) 100-25 MG tablet TAKE 1 TABLET BY MOUTH DAILY.  . magnesium gluconate (MAGONATE) 500 MG tablet Take 500 mg by mouth daily.  . meloxicam (MOBIC) 15 MG tablet Take 1 tablet (15 mg total) by mouth daily.  . metoprolol succinate (TOPROL XL) 25 MG 24 hr tablet Take 1 tablet (25 mg total) by mouth daily.  . Multiple Vitamin (MULTIVITAMIN) tablet Take 1 tablet by mouth daily.  . Polyethylene Glycol 3350 (MIRALAX PO) Take 17 g by mouth as needed.   . Triamcinolone Acetonide (NASACORT ALLERGY 24HR NA) Place 1 spray into the nose  as needed.   . venlafaxine XR (EFFEXOR-XR) 75 MG 24 hr capsule Take 1 capsule (75 mg total) by mouth at bedtime.    Allergies:   Patient has no known allergies.   Social History   Socioeconomic History  . Marital status: Single    Spouse name: Not on file  . Number of children: Not on file  . Years of education: Not on file  . Highest education level: Not on file  Occupational History  . Occupation: Counselling psychologist: [employer]  Tobacco Use  . Smoking status: Never Smoker  . Smokeless tobacco: Never Used  Vaping Use  . Vaping Use: Never used  Substance and Sexual Activity  . Alcohol use: Yes    Alcohol/week: 3.0 standard drinks    Types: 3 Glasses of wine per week    Comment: 3 x a week  . Drug use: No  . Sexual activity: Not on file  Other Topics Concern  . Not on file  Social History Narrative   Regular exercise- yes, 5-6 days a week, yoga   Diet: healthy   Social Determinants of Health   Financial Resource Strain: Low Risk   . Difficulty of Paying Living Expenses: Not hard at all  Food Insecurity: No Food Insecurity  . Worried About Charity fundraiser in the Last Year: Never true  . Ran Out of Food in the Last Year: Never true  Transportation Needs: No Transportation Needs  . Lack of Transportation (Medical): No  . Lack of Transportation (Non-Medical): No  Physical Activity: Sufficiently Active  . Days of Exercise per Week: 5 days  . Minutes of Exercise per Session: 30 min  Stress: No Stress Concern Present  . Feeling of Stress : Only a little  Social Connections: Not on file     Family History:  The patient's family history includes Breast cancer in her cousin; Cancer in her father and maternal grandmother; Hyperlipidemia in her father; Hypertension in her mother.   ROS:   Please see the history of present illness.    ROS All other systems reviewed and are negative.  No flowsheet data found.     PHYSICAL EXAM:   VS:  BP  120/80   Pulse 86   Ht 5\' 7"  (1.702 m)   Wt 157 lb 12.8 oz (71.6 kg)   SpO2 97%   BMI 24.71 kg/m    GEN: Well nourished, well developed in no acute distress HEENT: Normal NECK: No JVD; No carotid bruits LYMPHATICS: No lymphadenopathy CARDIAC:RRR, no murmurs, rubs, gallops RESPIRATORY:  Clear to auscultation without rales, wheezing or rhonchi  ABDOMEN: Soft, non-tender, non-distended MUSCULOSKELETAL:  No edema; No deformity  SKIN: Warm and dry NEUROLOGIC:  Alert and oriented x 3 PSYCHIATRIC:  Normal affect    Wt Readings from Last 3 Encounters:  07/18/20 157 lb 12.8 oz (71.6 kg)  06/24/20 157 lb (71.2 kg)  06/17/20 157 lb 6.4 oz (71.4 kg)      Studies/Labs Reviewed:   2D echo 2020-07-04 IMPRESSIONS   1. Left ventricular ejection fraction, by estimation, is 50 to 55%. The  left ventricle has low normal function. The left ventricle has no regional  wall motion abnormalities. Left ventricular diastolic parameters are  consistent with Grade I diastolic  dysfunction (impaired relaxation).  2. Right ventricular systolic function is normal. The right ventricular  size is mildly enlarged.  3. Left atrial size was mildly dilated.  4. Right atrial size was mildly dilated.  5. The mitral valve is normal in structure. Mild mitral valve  regurgitation. No evidence of mitral stenosis.  6. The aortic valve is normal in structure. Aortic valve regurgitation is  not visualized. No aortic stenosis is present.  7. The inferior vena cava is normal in size with greater than 50%  respiratory variability, suggesting right atrial pressure of 3 mmHg.   Cardiac MRI 07/2020 IMPRESSION: 1. Normal left ventricular size, thickness and systolic function (LVEF = 56%). There are no regional wall motion abnormalities and no late gadolinium enhancement in the left ventricular myocardium. Normal extracellular volume (ECV): 29%.  2. Normal right ventricular size, thickness and systolic  function (RVEF = 48%). There are no regional wall motion abnormalities.  3.  Normal left and right atrial size.  4. Normal size of the aortic root, ascending aorta and pulmonary artery.  5.  Mild mitral and trivial tricuspid regurgitation.  There is no evidence for infiltrative or inflammatory cardiomyopathy or arrhythmogenic right ventricular hypertrophy.  EKG:  EKG is was not ordered today  Recent Labs: 04/28/2020: ALT 24 05/31/2020: Hemoglobin 13.3; Platelets 233.0; TSH 0.94 06/17/2020: BUN 22; Creatinine, Ser 0.79; Magnesium 2.1; Potassium 3.9; Sodium 138   Lipid Panel    Component Value Date/Time   CHOL 166 04/28/2020 0806   TRIG 144.0 04/28/2020 0806   HDL 61.90 04/28/2020 0806   CHOLHDL 3 04/28/2020 0806   VLDL 28.8 04/28/2020 0806   LDLCALC 75 04/28/2020 0806      Additional studies/ records that were reviewed today include:  OV notes from PCP, EKG  2D echo   ASSESSMENT:    1. PVC's (premature ventricular contractions)   2. Essential hypertension, benign      PLAN:  In order of problems listed above:  1. Palpitations/PVCs -EKG today shows frequent trigeminal PVCs -2 week ziopatch to assess PVC load is pending -cardiac MRI showed normal LVF and RVF with no LGE  -TSH and Hbg were normal recently -Lexiscan myoview with no ischemia -palpitations improved on BB -continueToprol XL 25mg  daily  2.  HTN -Bp controlled on exam today -continue Hyzaar 100-25mg  daily and Toprol XL 25mg  daily   Medication Adjustments/Labs and Tests Ordered: Current medicines are reviewed at length with the patient today.  Concerns regarding medicines are outlined above.  Medication changes, Labs and Tests ordered today are listed in the Patient Instructions below.  There are no Patient Instructions on file for this visit.   Signed, Fransico Him, MD  07/18/2020 11:53 AM    [address], [place], [place]  [address] Phone: [phone]; Fax: [phone]

## 2020-07-26 ENCOUNTER — Other Ambulatory Visit: Payer: Self-pay | Admitting: Family Medicine

## 2020-07-26 MED FILL — LOSARTAN-HCTZ 100-25 MG TAB: 100-25 | 30 days supply | Qty: 30 | Fill #0

## 2020-07-27 ENCOUNTER — Encounter: Payer: Self-pay | Admitting: Cardiology

## 2020-07-27 DIAGNOSIS — I493 Ventricular premature depolarization: Secondary | ICD-10-CM | POA: Insufficient documentation

## 2020-07-27 DIAGNOSIS — I4719 Other supraventricular tachycardia: Secondary | ICD-10-CM | POA: Insufficient documentation

## 2020-07-27 DIAGNOSIS — I471 Supraventricular tachycardia: Secondary | ICD-10-CM | POA: Insufficient documentation

## 2020-08-01 ENCOUNTER — Other Ambulatory Visit: Payer: Self-pay | Admitting: Cardiology

## 2020-08-01 ENCOUNTER — Telehealth: Payer: Self-pay

## 2020-08-01 DIAGNOSIS — I493 Ventricular premature depolarization: Secondary | ICD-10-CM

## 2020-08-01 DIAGNOSIS — I499 Cardiac arrhythmia, unspecified: Secondary | ICD-10-CM

## 2020-08-01 DIAGNOSIS — I471 Supraventricular tachycardia: Secondary | ICD-10-CM

## 2020-08-01 MED ORDER — METOPROLOL SUCCINATE ER 50 MG PO TB24: 50.0000 mg | ORAL_TABLET | Freq: Every day | ORAL | 3 refills | Status: DC

## 2020-08-01 MED FILL — METOPROLOL SUCCINATE ER 50: 50 | 90 days supply | Qty: 90 | Fill #0

## 2020-08-01 NOTE — Telephone Encounter (Signed)
-----   Message from Quintella Reichert, MD sent at 07/27/2020 11:56 AM EST ----- Heart monitor showed normal rhythm with extra heart beats from the top and bottoms of the heart with runs of atrial tach up to 16 beats in a row.  Cardiac MRI with normal LVF and no LGE, normal RV and stress test normal.  Please refer to Dr. Lalla Brothers for evaluation.  Increase Toprol XL to 50mg  daily

## 2020-08-01 NOTE — Telephone Encounter (Signed)
Left message for patient with results. Referral has been placed. Rx has been sent in. Patient advised to call back with any questions.

## 2020-08-17 ENCOUNTER — Encounter: Payer: Self-pay | Admitting: Cardiology

## 2020-08-17 ENCOUNTER — Other Ambulatory Visit: Payer: Self-pay

## 2020-08-17 ENCOUNTER — Other Ambulatory Visit: Payer: Self-pay | Admitting: Cardiology

## 2020-08-17 ENCOUNTER — Ambulatory Visit: Payer: PPO | Admitting: Cardiology

## 2020-08-17 VITALS — BP 116/74 | HR 82 | Ht 67.0 in | Wt 159.8 lb

## 2020-08-17 DIAGNOSIS — I10 Essential (primary) hypertension: Secondary | ICD-10-CM | POA: Diagnosis not present

## 2020-08-17 DIAGNOSIS — I493 Ventricular premature depolarization: Secondary | ICD-10-CM | POA: Diagnosis not present

## 2020-08-17 MED ORDER — METOPROLOL SUCCINATE ER 50 MG PO TB24
50.0000 mg | ORAL_TABLET | Freq: Two times a day (BID) | ORAL | 3 refills | Status: DC
Start: 2020-08-17 — End: 2020-08-17

## 2020-08-17 NOTE — Progress Notes (Signed)
Electrophysiology Office Note:    Date:  08/17/2020   ID:  Christina Griffith, DOB May 18, 1951, MRN [id number]  PCP:  Jinny Sanders, MD  [facility] Cardiologist:  Fransico Him, MD  [facility] Electrophysiologist:  None   Referring MD: Sueanne Margarita, MD   Chief Complaint: Frequent PVCs  History of Present Illness:    Christina Griffith is a 70 y.o. female who presents for an evaluation of frequent PVCs at the request of Dr. Radford Pax. Their medical history includes hypertension.  Patient was last seen by Dr. Radford Pax July 18, 2020.  At that appointment, Ms.Cedano reported palpitations that started approximately 4 hours after getting her COVID-19 vaccine on May 14, 2020.  Prior to the booster shot, the patient did not notice a large burden disease.  She tells me during today's visit the she can tell sometimes that she is having PVCs but they do not have causing significant symptoms.  She is very active sometimes goes on multiple walks per day.  Some of her walks are upwards of 45 minutes.  She is not limited by the PVCs.  She had been started on metoprolol succinate 50 mg by mouth daily and this seems to have reduced the number of PVCs.  She denies syncope or presyncope.  She is worn a ZIO monitor which showed 18% burden of PVCs, largely monomorphic.  She has also had a cardiac MRI which showed a normal left and right ventricular function and no LGE.  Past Medical History:  Diagnosis Date  . Arthritis    spinal stenosis--back pain--hx of herniated cervical disk- but no surgery  . Asthma   . Cold 10/03/12   pt getting over a cold--still has some head congestion, slight cough  . Colon polyps   . Endometrial ca ([place])   . H/O: hematuria    benign essential hematuria - per urology work up   . History of depression   . History of pneumonia   . Hypertension   . Numbness in right leg    pt. states occassional in right leg and right big toe from disc herniations  . PAT (paroxysmal  atrial tachycardia) ([place])    noted on heart monitor 07/2020  . PONV (postoperative nausea and vomiting)    with prior hip replacement  . PVC's (premature ventricular contractions)    PVCs, trigeminal PVCs, WCT nonsustained with PVC load 18% by montior 07/2020  . Stress incontinence   . Wears glasses     Past Surgical History:  Procedure Laterality Date  . BREAST SURGERY Right 1980's   breast biopsy  . COLONOSCOPY    . DENTAL SURGERY  11/2019  . EYE SURGERY Bilateral 02/2015   cataracts  . HYSTEROSCOPY WITH D & C Left 1/24/204   w/ resection of Endometrial polyps  . ROBOTIC ASSISTED TOTAL HYSTERECTOMY WITH BILATERAL SALPINGO OOPHERECTOMY N/A 10/07/2012   Procedure: ROBOTIC ASSISTED TOTAL HYSTERECTOMY WITH BILATERAL SALPINGO OOPHORECTOMY/POSSIBLE LYMPH NODE BIOPSY;  Surgeon: Imagene Gurney A. Alycia Rossetti, MD;  Location: [facility];  Service: Gynecology;  Laterality: N/A;  . TOTAL HIP ARTHROPLASTY     right 2002 left 2004  . TOTAL HIP REVISION Right 05/09/2015   Procedure: TOTAL HIP REVISION;  Surgeon: Frederik Pear, MD;  Location: [place];  Service: Orthopedics;  Laterality: Right;    Current Medications: Current Meds  Medication Sig  . albuterol (PROVENTIL HFA;VENTOLIN HFA) 108 (90 Base) MCG/ACT inhaler Inhale 2 puffs into the lungs every 6 (six) hours as needed for wheezing  or shortness of breath.  Marland Kitchen atorvastatin (LIPITOR) 10 MG tablet TAKE 1 TABLET BY MOUTH ONCE DAILY  . Calcium Carb-Cholecalciferol 600-800 MG-UNIT TABS Take 1 tablet by mouth daily.  . fexofenadine (ALLEGRA) 180 MG tablet Take 180 mg by mouth daily as needed for allergies or rhinitis.  Marland Kitchen ibuprofen (ADVIL,MOTRIN) 200 MG tablet Take 600 mg by mouth every 8 (eight) hours as needed.  Marland Kitchen losartan-hydrochlorothiazide (HYZAAR) 100-25 MG tablet TAKE 1 TABLET BY MOUTH DAILY.  . magnesium gluconate (MAGONATE) 500 MG tablet Take 500 mg by mouth daily.  . meloxicam (MOBIC) 15 MG tablet Take 1 tablet (15 mg total) by mouth daily.  . metoprolol  succinate (TOPROL-XL) 50 MG 24 hr tablet Take 1 tablet (50 mg total) by mouth daily. Take with or immediately following a meal.  . Multiple Vitamin (MULTIVITAMIN) tablet Take 1 tablet by mouth daily.  . Polyethylene Glycol 3350 (MIRALAX PO) Take 17 g by mouth as needed.   . Triamcinolone Acetonide (NASACORT ALLERGY 24HR NA) Place 1 spray into the nose as needed.   . venlafaxine XR (EFFEXOR-XR) 75 MG 24 hr capsule Take 1 capsule (75 mg total) by mouth at bedtime.     Allergies:   Patient has no known allergies.   Social History   Socioeconomic History  . Marital status: Single    Spouse name: Not on file  . Number of children: Not on file  . Years of education: Not on file  . Highest education level: Not on file  Occupational History  . Occupation: Counselling psychologist: [employer]  Tobacco Use  . Smoking status: Never Smoker  . Smokeless tobacco: Never Used  Vaping Use  . Vaping Use: Never used  Substance and Sexual Activity  . Alcohol use: Yes    Alcohol/week: 3.0 standard drinks    Types: 3 Glasses of wine per week    Comment: 3 x a week  . Drug use: No  . Sexual activity: Not on file  Other Topics Concern  . Not on file  Social History Narrative   Regular exercise- yes, 5-6 days a week, yoga   Diet: healthy   Social Determinants of Health   Financial Resource Strain: Low Risk   . Difficulty of Paying Living Expenses: Not hard at all  Food Insecurity: No Food Insecurity  . Worried About Charity fundraiser in the Last Year: Never true  . Ran Out of Food in the Last Year: Never true  Transportation Needs: No Transportation Needs  . Lack of Transportation (Medical): No  . Lack of Transportation (Non-Medical): No  Physical Activity: Sufficiently Active  . Days of Exercise per Week: 5 days  . Minutes of Exercise per Session: 30 min  Stress: No Stress Concern Present  . Feeling of Stress : Only a little  Social Connections: Not on file     Family  History: The patient's family history includes Breast cancer in her cousin; Cancer in her father and maternal grandmother; Hyperlipidemia in her father; Hypertension in her mother.  ROS:   Please see the history of present illness.    All other systems reviewed and are negative.  EKGs/Labs/Other Studies Reviewed:    The following studies were reviewed today:  07/20/2020 Zio results personally reviewed   EKG:  The ekg ordered today demonstrates sinus rhythm.  Her initial twelve-lead EKG showed no PVCs but then I ran a rhythm strip for 18 seconds.  This rhythm strip showed  frequent PVCs.  All of the PVCs were the same morphology except for 1.  The dominant PVC had a left superior axis, and terminal QRS notching a precordial transition from positive to negative in lead V4.  The minor PVC had a steeply inferior axis precordial transition in V3 which preceded her sinus precordial transition.  Recent Labs: 04/28/2020: ALT 24 05/31/2020: Hemoglobin 13.3; Platelets 233.0; TSH 0.94 06/17/2020: BUN 22; Creatinine, Ser 0.79; Magnesium 2.1; Potassium 3.9; Sodium 138  Recent Lipid Panel    Component Value Date/Time   CHOL 166 04/28/2020 0806   TRIG 144.0 04/28/2020 0806   HDL 61.90 04/28/2020 0806   CHOLHDL 3 04/28/2020 0806   VLDL 28.8 04/28/2020 0806   LDLCALC 75 04/28/2020 0806    Physical Exam:    VS:  BP 116/74   Pulse 82   Ht 5\' 7"  (1.702 m)   Wt 159 lb 12.8 oz (72.5 kg)   SpO2 98%   BMI 25.03 kg/m     Wt Readings from Last 3 Encounters:  08/17/20 159 lb 12.8 oz (72.5 kg)  07/18/20 157 lb 12.8 oz (71.6 kg)  06/24/20 157 lb (71.2 kg)     GEN:  Well nourished, well developed in no acute distress HEENT: Normal NECK: No JVD; No carotid bruits LYMPHATICS: No lymphadenopathy CARDIAC: RRR, no murmurs, rubs, gallops RESPIRATORY:  Clear to auscultation without rales, wheezing or rhonchi  ABDOMEN: Soft, non-tender, non-distended MUSCULOSKELETAL:  No edema; No deformity  SKIN:  Warm and dry NEUROLOGIC:  Alert and oriented x 3 PSYCHIATRIC:  Normal affect   ASSESSMENT:    No diagnosis found. PLAN:    In order of problems listed above:  1. ` Frequent PVCs The patient has frequent PVCs that are largely monomorphic.  The dominant PVC appears to be arising from the posterior medial Mahad muscle.  There is another PVC that appears to be originating from the left ventricular outflow tract.  Thankfully, the PVCs are largely asymptomatic and the patient has normal left ventricular function.  She has had some symptomatic improvement and reduction in PVC burden with the addition of metoprolol to her medication regimen.  We have room to uptitrate this medication with her blood pressure and resting heart rate.  I discussed the management options for her VBC during today's visit in detail including continued management with beta-blockers, the addition of antiarrhythmic therapy to help suppress the PVC or invasive management with an EP study and ablation.  Given the lack of symptoms and normal left ventricular function, I do not think there is an urgent indication to abolish the clinical PVC.  After discussing it with the patient, we decided to increase the dose of her metoprolol succinate to 50 mg twice daily and see her back in 6 to 8 weeks to reassess her symptoms and burden of the disease.  If she continues to have PVCs, consider the addition of an antiarrhythmic such as flecainide.  2.  Hypertension Controlled on her current regimen.  Continue losartan and metoprolol   Medication Adjustments/Labs and Tests Ordered: Current medicines are reviewed at length with the patient today.  Concerns regarding medicines are outlined above.  No orders of the defined types were placed in this encounter.  No orders of the defined types were placed in this encounter.    Signed, Lars Mage, MD, [facility]  08/17/2020 4:12 PM    Electrophysiology [facility]

## 2020-08-17 NOTE — Patient Instructions (Addendum)
Medication Instructions:  Your physician has recommended you make the following change in your medication:   1.  INCREASE your metoprolol succinate 50 mg- Take one tablet by mouth twice a day  Labwork: None ordered.  Testing/Procedures: None ordered.  Follow-Up: Your physician wants you to follow-up in: 6-8 weeks with Dr. Quentin Ore.     Any Other Special Instructions Will Be Listed Below (If Applicable).  If you need a refill on your cardiac medications before your next appointment, please call your pharmacy.

## 2020-08-29 ENCOUNTER — Other Ambulatory Visit: Payer: Self-pay | Admitting: Family Medicine

## 2020-08-29 MED FILL — LOSARTAN-HCTZ 100-25 MG TAB: 100-25 | 30 days supply | Qty: 30 | Fill #1

## 2020-08-29 MED FILL — VENLAFAXINE HCL ER 75 MG CA: 75 | 90 days supply | Qty: 90 | Fill #2

## 2020-08-29 NOTE — Telephone Encounter (Signed)
Last office visit 05/31/2020 for irregular heartbeat.  Last refilled 05/05/2020 for #30 with no refills.  CPE scheduled for 05/23/2021.

## 2020-08-30 ENCOUNTER — Other Ambulatory Visit: Payer: Self-pay | Admitting: Family Medicine

## 2020-08-30 MED FILL — MELOXICAM 15 MG TABLET: 15 | 30 days supply | Qty: 30 | Fill #0

## 2020-09-19 ENCOUNTER — Other Ambulatory Visit: Payer: Self-pay | Admitting: Family Medicine

## 2020-09-19 MED FILL — ATORVASTATIN CALCIUM 10 MG: 10 | 90 days supply | Qty: 90 | Fill #0

## 2020-09-29 DIAGNOSIS — Z1231 Encounter for screening mammogram for malignant neoplasm of breast: Secondary | ICD-10-CM | POA: Diagnosis not present

## 2020-09-29 LAB — HM MAMMOGRAPHY

## 2020-09-29 MED FILL — METOPROLOL SUCCINATE ER 50: 50 | 90 days supply | Qty: 180 | Fill #0

## 2020-09-29 MED FILL — LOSARTAN-HCTZ 100-25 MG TAB: 100-25 | 30 days supply | Qty: 30 | Fill #2

## 2020-10-04 ENCOUNTER — Other Ambulatory Visit: Payer: Self-pay

## 2020-10-04 ENCOUNTER — Encounter: Payer: Self-pay | Admitting: Cardiology

## 2020-10-04 ENCOUNTER — Ambulatory Visit: Payer: PPO | Admitting: Cardiology

## 2020-10-04 VITALS — BP 144/68 | HR 86 | Ht 67.0 in | Wt 163.0 lb

## 2020-10-04 DIAGNOSIS — I10 Essential (primary) hypertension: Secondary | ICD-10-CM

## 2020-10-04 DIAGNOSIS — I493 Ventricular premature depolarization: Secondary | ICD-10-CM

## 2020-10-04 NOTE — Progress Notes (Signed)
Electrophysiology Office Follow up Visit Note:    Date:  10/04/2020   ID:  Christina Griffith, DOB 1950-09-24, MRN [id number]  PCP:  Jinny Sanders, MD  [facility] Cardiologist:  Fransico Him, MD  [facility] Electrophysiologist:  Vickie Epley, MD    Interval History:    Christina Griffith is a 70 y.o. female who presents for a follow up visit.  I last saw the patient August 17, 2020.  At that appointment, we uptitrated her Toprol-XL to 50 mg twice daily with a plan to reassess at today's appointment.  We also discussed the possibility of needing an antiarrhythmic such as flecainide in the future to help control her PVCs if they persist.  She tells me she has been doing very well.  Her burden of PVCs is significantly decreased since starting metoprolol.  She is doing well on metoprolol without off target effects.   Past Medical History:  Diagnosis Date  . Arthritis    spinal stenosis--back pain--hx of herniated cervical disk- but no surgery  . Asthma   . Cold 10/03/12   pt getting over a cold--still has some head congestion, slight cough  . Colon polyps   . Endometrial ca ([place])   . H/O: hematuria    benign essential hematuria - per urology work up   . History of depression   . History of pneumonia   . Hypertension   . Numbness in right leg    pt. states occassional in right leg and right big toe from disc herniations  . PAT (paroxysmal atrial tachycardia) ([place])    noted on heart monitor 07/2020  . PONV (postoperative nausea and vomiting)    with prior hip replacement  . PVC's (premature ventricular contractions)    PVCs, trigeminal PVCs, WCT nonsustained with PVC load 18% by montior 07/2020  . Stress incontinence   . Wears glasses     Past Surgical History:  Procedure Laterality Date  . BREAST SURGERY Right 1980's   breast biopsy  . COLONOSCOPY    . DENTAL SURGERY  11/2019  . EYE SURGERY Bilateral 02/2015   cataracts  . HYSTEROSCOPY WITH D & C Left  1/24/204   w/ resection of Endometrial polyps  . ROBOTIC ASSISTED TOTAL HYSTERECTOMY WITH BILATERAL SALPINGO OOPHERECTOMY N/A 10/07/2012   Procedure: ROBOTIC ASSISTED TOTAL HYSTERECTOMY WITH BILATERAL SALPINGO OOPHORECTOMY/POSSIBLE LYMPH NODE BIOPSY;  Surgeon: Imagene Gurney A. Alycia Rossetti, MD;  Location: [facility];  Service: Gynecology;  Laterality: N/A;  . TOTAL HIP ARTHROPLASTY     right 2002 left 2004  . TOTAL HIP REVISION Right 05/09/2015   Procedure: TOTAL HIP REVISION;  Surgeon: Frederik Pear, MD;  Location: [place];  Service: Orthopedics;  Laterality: Right;    Current Medications: Current Meds  Medication Sig  . albuterol (PROVENTIL HFA;VENTOLIN HFA) 108 (90 Base) MCG/ACT inhaler Inhale 2 puffs into the lungs every 6 (six) hours as needed for wheezing or shortness of breath.  Marland Kitchen atorvastatin (LIPITOR) 10 MG tablet TAKE 1 TABLET BY MOUTH ONCE DAILY  . Calcium Carb-Cholecalciferol 600-800 MG-UNIT TABS Take 1 tablet by mouth daily.  . fexofenadine (ALLEGRA) 180 MG tablet Take 180 mg by mouth daily as needed for allergies or rhinitis.  Marland Kitchen ibuprofen (ADVIL,MOTRIN) 200 MG tablet Take 600 mg by mouth every 8 (eight) hours as needed.  Marland Kitchen losartan-hydrochlorothiazide (HYZAAR) 100-25 MG tablet TAKE 1 TABLET BY MOUTH DAILY.  . magnesium gluconate (MAGONATE) 500 MG tablet Take 500 mg by mouth daily.  . meloxicam (MOBIC)  15 MG tablet TAKE 1 TABLET (15 MG TOTAL) BY MOUTH DAILY.  . metoprolol succinate (TOPROL XL) 50 MG 24 hr tablet Take 1 tablet (50 mg total) by mouth in the morning and at bedtime. Take with or immediately following a meal.  . Multiple Vitamin (MULTIVITAMIN) tablet Take 1 tablet by mouth daily.  . Polyethylene Glycol 3350 (MIRALAX PO) Take 17 g by mouth as needed.   . Triamcinolone Acetonide (NASACORT ALLERGY 24HR NA) Place 1 spray into the nose as needed.   . venlafaxine XR (EFFEXOR-XR) 75 MG 24 hr capsule Take 1 capsule (75 mg total) by mouth at bedtime.     Allergies:   Patient has no known  allergies.   Social History   Socioeconomic History  . Marital status: Single    Spouse name: Not on file  . Number of children: Not on file  . Years of education: Not on file  . Highest education level: Not on file  Occupational History  . Occupation: Counselling psychologist: [employer]  Tobacco Use  . Smoking status: Never Smoker  . Smokeless tobacco: Never Used  Vaping Use  . Vaping Use: Never used  Substance and Sexual Activity  . Alcohol use: Yes    Alcohol/week: 3.0 standard drinks    Types: 3 Glasses of wine per week    Comment: 3 x a week  . Drug use: No  . Sexual activity: Not on file  Other Topics Concern  . Not on file  Social History Narrative   Regular exercise- yes, 5-6 days a week, yoga   Diet: healthy   Social Determinants of Health   Financial Resource Strain: Low Risk   . Difficulty of Paying Living Expenses: Not hard at all  Food Insecurity: No Food Insecurity  . Worried About Charity fundraiser in the Last Year: Never true  . Ran Out of Food in the Last Year: Never true  Transportation Needs: No Transportation Needs  . Lack of Transportation (Medical): No  . Lack of Transportation (Non-Medical): No  Physical Activity: Sufficiently Active  . Days of Exercise per Week: 5 days  . Minutes of Exercise per Session: 30 min  Stress: No Stress Concern Present  . Feeling of Stress : Only a little  Social Connections: Not on file     Family History: The patient's family history includes Breast cancer in her cousin; Cancer in her father and maternal grandmother; Hyperlipidemia in her father; Hypertension in her mother.  ROS:   Please see the history of present illness.    All other systems reviewed and are negative.  EKGs/Labs/Other Studies Reviewed:    The following studies were reviewed today:   EKG:  The ekg ordered today demonstrates sinus rhythm.  Rhythm strip without PVCs.  Recent Labs: 04/28/2020: ALT 24 05/31/2020:  Hemoglobin 13.3; Platelets 233.0; TSH 0.94 06/17/2020: BUN 22; Creatinine, Ser 0.79; Magnesium 2.1; Potassium 3.9; Sodium 138  Recent Lipid Panel    Component Value Date/Time   CHOL 166 04/28/2020 0806   TRIG 144.0 04/28/2020 0806   HDL 61.90 04/28/2020 0806   CHOLHDL 3 04/28/2020 0806   VLDL 28.8 04/28/2020 0806   LDLCALC 75 04/28/2020 0806    Physical Exam:    VS:  BP (!) 144/68   Pulse 86   Ht 5\' 7"  (1.702 m)   Wt 163 lb (73.9 kg)   SpO2 98%   BMI 25.53 kg/m     Wt Readings  from Last 3 Encounters:  10/04/20 163 lb (73.9 kg)  08/17/20 159 lb 12.8 oz (72.5 kg)  07/18/20 157 lb 12.8 oz (71.6 kg)     GEN:  Well nourished, well developed in no acute distress HEENT: Normal NECK: No JVD; No carotid bruits LYMPHATICS: No lymphadenopathy CARDIAC: RRR, no murmurs, rubs, gallops RESPIRATORY:  Clear to auscultation without rales, wheezing or rhonchi  ABDOMEN: Soft, non-tender, non-distended MUSCULOSKELETAL:  No edema; No deformity  SKIN: Warm and dry NEUROLOGIC:  Alert and oriented x 3 PSYCHIATRIC:  Normal affect   ASSESSMENT:    1. PVC's (premature ventricular contractions)   2. Primary hypertension    PLAN:    In order of problems listed above:  1. PVCs Significantly improved on an increased dose of Toprol-XL.  Would recommend we continue this dose of metoprolol with follow-up in 1 year.  If she develops increased burden of PVCs, could consider trial of antiarrhythmic such as flecainide.  2.  Hypertension Slightly above goal today.  Continue losartan, metoprolol.  Follow-up 1 year.   Medication Adjustments/Labs and Tests Ordered: Current medicines are reviewed at length with the patient today.  Concerns regarding medicines are outlined above.  Orders Placed This Encounter  Procedures  . EKG 12-Lead   No orders of the defined types were placed in this encounter.    Signed, Lars Mage, MD, [facility]  10/04/2020 9:18 AM    Electrophysiology [facility]

## 2020-10-04 NOTE — Patient Instructions (Signed)
Medication Instructions:  Your physician recommends that you continue on your current medications as directed. Please refer to the Current Medication list given to you today.  Labwork: None ordered.  Testing/Procedures: None ordered.  Follow-Up: Your physician wants you to follow-up in: one year with Dr. Quentin Ore.   You will receive a reminder letter in the mail two months in advance. If you don't receive a letter, please call our office to schedule the follow-up appointment.  Any Other Special Instructions Will Be Listed Below (If Applicable).  If you need a refill on your cardiac medications before your next appointment, please call your pharmacy.

## 2020-10-05 ENCOUNTER — Encounter: Payer: Self-pay | Admitting: Family Medicine

## 2020-10-24 ENCOUNTER — Other Ambulatory Visit (HOSPITAL_COMMUNITY): Payer: Self-pay | Admitting: Internal Medicine

## 2020-10-24 MED FILL — LOSARTAN-HCTZ 100-25 MG TAB: 100-25 | 30 days supply | Qty: 30 | Fill #3

## 2020-10-24 MED FILL — SHINGRIX 50 MCG SUS: 50 | 1 days supply | Qty: 1 | Fill #0

## 2020-11-24 ENCOUNTER — Other Ambulatory Visit (HOSPITAL_COMMUNITY): Payer: Self-pay

## 2020-11-28 ENCOUNTER — Other Ambulatory Visit (HOSPITAL_COMMUNITY): Payer: Self-pay

## 2020-11-28 MED FILL — Venlafaxine HCl Cap ER 24HR 75 MG (Base Equivalent): ORAL | 90 days supply | Qty: 90 | Fill #0 | Status: AC

## 2020-11-28 MED FILL — Losartan Potassium & Hydrochlorothiazide Tab 100-25 MG: ORAL | 30 days supply | Qty: 30 | Fill #0 | Status: AC

## 2020-12-14 ENCOUNTER — Other Ambulatory Visit (HOSPITAL_COMMUNITY): Payer: Self-pay

## 2020-12-14 MED FILL — Atorvastatin Calcium Tab 10 MG (Base Equivalent): ORAL | 90 days supply | Qty: 90 | Fill #0 | Status: AC

## 2020-12-28 ENCOUNTER — Other Ambulatory Visit (HOSPITAL_COMMUNITY): Payer: Self-pay

## 2020-12-28 MED FILL — Losartan Potassium & Hydrochlorothiazide Tab 100-25 MG: ORAL | 30 days supply | Qty: 30 | Fill #1 | Status: AC

## 2020-12-28 MED FILL — Metoprolol Succinate Tab ER 24HR 50 MG (Tartrate Equiv): ORAL | 90 days supply | Qty: 180 | Fill #0 | Status: AC

## 2021-01-03 ENCOUNTER — Other Ambulatory Visit (HOSPITAL_COMMUNITY): Payer: Self-pay

## 2021-01-03 MED FILL — Zoster Vac Recombinant Adjuvanted for IM Inj 50 MCG/0.5ML: INTRAMUSCULAR | 1 days supply | Qty: 1 | Fill #0 | Status: CN

## 2021-01-06 ENCOUNTER — Other Ambulatory Visit (HOSPITAL_COMMUNITY): Payer: Self-pay

## 2021-01-06 MED FILL — Zoster Vac Recombinant Adjuvanted for IM Inj 50 MCG/0.5ML: INTRAMUSCULAR | 1 days supply | Qty: 1 | Fill #0 | Status: AC

## 2021-01-17 ENCOUNTER — Other Ambulatory Visit: Payer: Self-pay

## 2021-01-17 ENCOUNTER — Encounter: Payer: Self-pay | Admitting: Cardiology

## 2021-01-17 ENCOUNTER — Other Ambulatory Visit (HOSPITAL_COMMUNITY): Payer: Self-pay

## 2021-01-17 ENCOUNTER — Ambulatory Visit (INDEPENDENT_AMBULATORY_CARE_PROVIDER_SITE_OTHER): Payer: PPO

## 2021-01-17 ENCOUNTER — Ambulatory Visit: Payer: PPO | Admitting: Cardiology

## 2021-01-17 VITALS — BP 122/52 | HR 81 | Ht 67.0 in | Wt 166.0 lb

## 2021-01-17 DIAGNOSIS — I471 Supraventricular tachycardia: Secondary | ICD-10-CM

## 2021-01-17 DIAGNOSIS — I493 Ventricular premature depolarization: Secondary | ICD-10-CM

## 2021-01-17 DIAGNOSIS — I10 Essential (primary) hypertension: Secondary | ICD-10-CM

## 2021-01-17 MED ORDER — LOSARTAN POTASSIUM-HCTZ 100-25 MG PO TABS
1.0000 | ORAL_TABLET | Freq: Every day | ORAL | 1 refills | Status: DC
  Filled 2021-01-17 – 2021-01-25 (×2): qty 90, 90d supply, fill #0
  Filled 2021-04-24: qty 90, 90d supply, fill #1

## 2021-01-17 MED ORDER — METOPROLOL SUCCINATE ER 50 MG PO TB24
50.0000 mg | ORAL_TABLET | Freq: Two times a day (BID) | ORAL | 1 refills | Status: DC
  Filled 2021-01-17: qty 180, fill #0

## 2021-01-17 NOTE — Patient Instructions (Signed)
Medication Instructions:  Your physician recommends that you continue on your current medications as directed. Please refer to the Current Medication list given to you today.  *If you need a refill on your cardiac medications before your next appointment, please call your pharmacy*   Follow-Up: At [facility], you and your health needs are our priority.  As part of our continuing mission to provide you with exceptional heart care, we have created designated Provider Care Teams.  These Care Teams include your primary Cardiologist (physician) and Advanced Practice Providers (APPs -  Physician Assistants and Nurse Practitioners) who all work together to provide you with the care you need, when you need it.  Your next appointment:   6 month(s)  The format for your next appointment:   In Person  Provider:   You may see Fransico Him, MD or one of the following Advanced Practice Providers on your designated Care Team:   Melina Copa, PA-C Ermalinda Barrios, PA-C   [place] Monitor Instructions  Your physician has requested you wear a ZIO patch monitor for 14 days.  This is a single patch monitor. [employer] supplies one patch monitor per enrollment. Additional stickers are not available. Please do not apply patch if you will be having a Nuclear Stress Test,  Echocardiogram, Cardiac CT, MRI, or Chest Xray during the period you would be wearing the  monitor. The patch cannot be worn during these tests. You cannot remove and re-apply the  ZIO XT patch monitor.  Your ZIO patch monitor will be mailed 3 day USPS to your address on file. It may take 3-5 days  to receive your monitor after you have been enrolled.  Once you have received your monitor, please review the enclosed instructions. Your monitor  has already been registered assigning a specific monitor serial # to you.  Billing and Patient Assistance Program Information  We have supplied [employer] with any of your insurance information  on file for billing purposes. [employer] offers a sliding scale Patient Assistance Program for patients that do not have  insurance, or whose insurance does not completely cover the cost of the ZIO monitor.  You must apply for the Patient Assistance Program to qualify for this discounted rate.  To apply, please call [employer] at [phone], select option 4, select option 2, ask to apply for  Patient Assistance Program. Theodore Demark will ask your household income, and how many people  are in your household. They will quote your out-of-pocket cost based on that information.  [employer] will also be able to set up a 72-month, interest-free payment plan if needed.  Applying the monitor   Shave hair from upper left chest.  Hold abrader disc by orange tab. Rub abrader in 40 strokes over the upper left chest as  indicated in your monitor instructions.  Clean area with 4 enclosed alcohol pads. Let dry.  Apply patch as indicated in monitor instructions. Patch will be placed under collarbone on left  side of chest with arrow pointing upward.  Rub patch adhesive wings for 2 minutes. Remove white label marked "1". Remove the white  label marked "2". Rub patch adhesive wings for 2 additional minutes.  While looking in a mirror, press and release button in center of patch. A small green light will  flash 3-4 times. This will be your only indicator that the monitor has been turned on.  Do not shower for the first 24 hours. You may shower after the first 24 hours.  Press  the button if you feel a symptom. You will hear a small click. Record Date, Time and  Symptom in the Patient Logbook.  When you are ready to remove the patch, follow instructions on the last 2 pages of Patient  Logbook. Stick patch monitor onto the last page of Patient Logbook.  Place Patient Logbook in the blue and white box. Use locking tab on box and tape box closed  securely. The blue and white box has prepaid postage on it. Please place it in  the mailbox as  soon as possible. Your physician should have your test results approximately 7 days after the  monitor has been mailed back to [facility].  Call [place] at [phone] if you have questions regarding  your ZIO XT patch monitor. Call them immediately if you see an orange light blinking on your  monitor.  If your monitor falls off in less than 4 days, contact our Monitor department at [phone].  If your monitor becomes loose or falls off after 4 days call [employer] at [phone] for  suggestions on securing your monitor

## 2021-01-17 NOTE — Progress Notes (Unsigned)
Patient enrolled for [employer] to mail a 14 day ZIO XT monitor to address on file. 

## 2021-01-17 NOTE — Progress Notes (Signed)
Cardiology office Note    Date:  01/17/2021   ID:  Christina Griffith, DOB Dec 08, 1950, MRN [id number]  PCP:  Jinny Sanders, MD  Cardiologist:  Fransico Him, MD   Chief Complaint  Patient presents with   Follow-up    PVCs, NSVT, PAT and HTN     History of Present Illness:  Christina Griffith is a 70 y.o. female  with a hx of asthma and HTN who recently developed palpitations.  This started a few hours after getting her COVID 19 booster on Oct 9th and had been feeling fine.  She went to her PCP about 10 days after the shot and showed PVCs.    She had an echo that showed low normal LVF with EF 50-55% with G1DD, mildly enlarged RA and LA, mild MR.  Lab work in Sept and Oct was normal including TSH and Hbg. She had a Lexiscan myoview done showing no ischemia. She was started on Toprol XL 25mg  daily for suppression of PVCs.  Cardiac MRI showed normal LVF with EF 56% with no LGE, normal RV, mild MR and TR.  2 week ziopatch was ordered that showed frequent PVCs, trigeminal PVCs and nonsustained wide complex tachycardia up to 5 beats in a row with PVC load 18%.  SHe also had nonsustained atrial tachycardia up to 16 beats in a row as fast ad 235bpm. Her Toprol XL was increased to 50mg  daily and she was referred to EP.  AADT with Flecainide was discussed but her palpitations had improved with Toprol and medical therapy was continued.    SHe is here today for followup and is doing well.  She denies any SOB, DOE, PND, orthopnea, LE edema, dizziness or syncope. She has been checking her pulse and notices that the episodes of no irregularity have increased.  She says that she has had 12 episodes of palpitations in the past 3 months that she could feel.  The palpitations last anywhere from a few minutes to several hours.  When she has the palpitations her chest feels a mild pressure and also notices it when he palpates over her sternum.  She has no exertional sx.    She is compliant with her meds and is  tolerating meds with no SE.      Past Medical History:  Diagnosis Date   Arthritis    spinal stenosis--back pain--hx of herniated cervical disk- but no surgery   Asthma    Cold 10/03/12   pt getting over a cold--still has some head congestion, slight cough   Colon polyps    Endometrial ca [facility])    H/O: hematuria    benign essential hematuria - per urology work up    History of depression    History of pneumonia    Hypertension    Numbness in right leg    pt. states occassional in right leg and right big toe from disc herniations   PAT (paroxysmal atrial tachycardia) ([place])    noted on heart monitor 07/2020   PONV (postoperative nausea and vomiting)    with prior hip replacement   PVC's (premature ventricular contractions)    PVCs, trigeminal PVCs, WCT nonsustained with PVC load 18% by montior 07/2020   Stress incontinence    Wears glasses     Past Surgical History:  Procedure Laterality Date   BREAST SURGERY Right 1980's   breast biopsy   COLONOSCOPY     DENTAL SURGERY  11/2019   EYE SURGERY Bilateral 02/2015  cataracts   HYSTEROSCOPY WITH D & C Left 1/24/204   w/ resection of Endometrial polyps   ROBOTIC ASSISTED TOTAL HYSTERECTOMY WITH BILATERAL SALPINGO OOPHERECTOMY N/A 10/07/2012   Procedure: ROBOTIC ASSISTED TOTAL HYSTERECTOMY WITH BILATERAL SALPINGO OOPHORECTOMY/POSSIBLE LYMPH NODE BIOPSY;  Surgeon: Imagene Gurney A. Alycia Rossetti, MD;  Location: [facility];  Service: Gynecology;  Laterality: N/A;   TOTAL HIP ARTHROPLASTY     right 2002 left 2004   TOTAL HIP REVISION Right 05/09/2015   Procedure: TOTAL HIP REVISION;  Surgeon: Frederik Pear, MD;  Location: [place];  Service: Orthopedics;  Laterality: Right;    Current Medications: Current Meds  Medication Sig   albuterol (PROVENTIL HFA;VENTOLIN HFA) 108 (90 Base) MCG/ACT inhaler Inhale 2 puffs into the lungs every 6 (six) hours as needed for wheezing or shortness of breath.   atorvastatin (LIPITOR) 10 MG tablet TAKE 1 TABLET BY MOUTH ONCE  DAILY   Calcium Carb-Cholecalciferol 600-800 MG-UNIT TABS Take 1 tablet by mouth daily.   fexofenadine (ALLEGRA) 180 MG tablet Take 180 mg by mouth daily as needed for allergies or rhinitis.   ibuprofen (ADVIL,MOTRIN) 200 MG tablet Take 600 mg by mouth every 8 (eight) hours as needed.   losartan-hydrochlorothiazide (HYZAAR) 100-25 MG tablet TAKE 1 TABLET BY MOUTH DAILY.   magnesium gluconate (MAGONATE) 500 MG tablet Take 500 mg by mouth daily.   meloxicam (MOBIC) 15 MG tablet TAKE 1 TABLET (15 MG TOTAL) BY MOUTH DAILY.   metoprolol succinate (TOPROL-XL) 50 MG 24 hr tablet TAKE 1 TABLET (50 MG TOTAL) BY MOUTH IN THE MORNING AND AT BEDTIME. TAKE WITH OR IMMEDIATELY FOLLOWING A MEAL.   Multiple Vitamin (MULTIVITAMIN) tablet Take 1 tablet by mouth daily.   Polyethylene Glycol 3350 (MIRALAX PO) Take 17 g by mouth as needed.    Triamcinolone Acetonide (NASACORT ALLERGY 24HR NA) Place 1 spray into the nose as needed.    venlafaxine XR (EFFEXOR-XR) 75 MG 24 hr capsule Take 1 capsule (75 mg total) by mouth at bedtime.   venlafaxine XR (EFFEXOR-XR) 75 MG 24 hr capsule TAKE 1 CAPSULE BY MOUTH ONCE DAILY   Zoster Vaccine Adjuvanted (SHINGRIX) injection TO BE ADMINISTERED BY PHARMACIST    Allergies:   Patient has no known allergies.   Social History   Socioeconomic History   Marital status: Single    Spouse name: Not on file   Number of children: Not on file   Years of education: Not on file   Highest education level: Not on file  Occupational History   Occupation: Engineer, mining    Employer: [employer]  Tobacco Use   Smoking status: Never   Smokeless tobacco: Never  Vaping Use   Vaping Use: Never used  Substance and Sexual Activity   Alcohol use: Yes    Alcohol/week: 3.0 standard drinks    Types: 3 Glasses of wine per week    Comment: 3 x a week   Drug use: No   Sexual activity: Not on file  Other Topics Concern   Not on file  Social History Narrative   Regular exercise-  yes, 5-6 days a week, yoga   Diet: healthy   Social Determinants of Health   Financial Resource Strain: Low Risk    Difficulty of Paying Living Expenses: Not hard at all  Food Insecurity: No Food Insecurity   Worried About Charity fundraiser in the Last Year: Never true   Ran Out of Food in the Last Year: Never true  Transportation Needs:  No Transportation Needs   Lack of Transportation (Medical): No   Lack of Transportation (Non-Medical): No  Physical Activity: Sufficiently Active   Days of Exercise per Week: 5 days   Minutes of Exercise per Session: 30 min  Stress: No Stress Concern Present   Feeling of Stress : Only a little  Social Connections: Not on file     Family History:  The patient's family history includes Breast cancer in her cousin; Cancer in her father and maternal grandmother; Hyperlipidemia in her father; Hypertension in her mother.   ROS:   Please see the history of present illness.    ROS All other systems reviewed and are negative.  No flowsheet data found.     PHYSICAL EXAM:   VS:  BP (!) 122/52   Pulse 81   Ht 5\' 7"  (1.702 m)   Wt 166 lb (75.3 kg)   SpO2 99%   BMI 26.00 kg/m     GEN: Well nourished, well developed in no acute distress HEENT: Normal NECK: No JVD; No carotid bruits LYMPHATICS: No lymphadenopathy CARDIAC:RRR, no murmurs, rubs, gallops RESPIRATORY:  Clear to auscultation without rales, wheezing or rhonchi  ABDOMEN: Soft, non-tender, non-distended MUSCULOSKELETAL:  No edema; No deformity  SKIN: Warm and dry NEUROLOGIC:  Alert and oriented x 3 PSYCHIATRIC:  Normal affect    Wt Readings from Last 3 Encounters:  01/17/21 166 lb (75.3 kg)  10/04/20 163 lb (73.9 kg)  08/17/20 159 lb 12.8 oz (72.5 kg)      Studies/Labs Reviewed:   2D echo 07/03/2020 IMPRESSIONS    1. Left ventricular ejection fraction, by estimation, is 50 to 55%. The  left ventricle has low normal function. The left ventricle has no regional  wall motion  abnormalities. Left ventricular diastolic parameters are  consistent with Grade I diastolic  dysfunction (impaired relaxation).   2. Right ventricular systolic function is normal. The right ventricular  size is mildly enlarged.   3. Left atrial size was mildly dilated.   4. Right atrial size was mildly dilated.   5. The mitral valve is normal in structure. Mild mitral valve  regurgitation. No evidence of mitral stenosis.   6. The aortic valve is normal in structure. Aortic valve regurgitation is  not visualized. No aortic stenosis is present.   7. The inferior vena cava is normal in size with greater than 50%  respiratory variability, suggesting right atrial pressure of 3 mmHg.   Cardiac MRI 07/2020 IMPRESSION: 1. Normal left ventricular size, thickness and systolic function (LVEF = 56%). There are no regional wall motion abnormalities and no late gadolinium enhancement in the left ventricular myocardium. Normal extracellular volume (ECV): 29%.   2. Normal right ventricular size, thickness and systolic function (RVEF = 48%). There are no regional wall motion abnormalities.   3.  Normal left and right atrial size.   4. Normal size of the aortic root, ascending aorta and pulmonary artery.   5.  Mild mitral and trivial tricuspid regurgitation.   There is no evidence for infiltrative or inflammatory cardiomyopathy or arrhythmogenic right ventricular hypertrophy.  EKG:  EKG is was not ordered today  Recent Labs: 04/28/2020: ALT 24 05/31/2020: Hemoglobin 13.3; Platelets 233.0; TSH 0.94 06/17/2020: BUN 22; Creatinine, Ser 0.79; Magnesium 2.1; Potassium 3.9; Sodium 138   Lipid Panel    Component Value Date/Time   CHOL 166 04/28/2020 0806   TRIG 144.0 04/28/2020 0806   HDL 61.90 04/28/2020 0806   CHOLHDL 3 04/28/2020 0806  VLDL 28.8 04/28/2020 0806   LDLCALC 75 04/28/2020 0806      Additional studies/ records that were reviewed today include:  OV notes from PCP, EKG  2D  echo   ASSESSMENT:    1. PVC's (premature ventricular contractions)   2. PAT (paroxysmal atrial tachycardia) ([place])   3. Primary hypertension      PLAN:  In order of problems listed above:  Palpitations/PVCs -2 week ziopatch  showed frequent PVCs with PVC load 18% with NSVT and trigeminal PVCs -seen by EP and discussed AADT vs.PVC ablation but her palpitations had significantly improved on higher dose of BB -cardiac MRI showed normal LVF and RVF with no LGE  -TSH and Hbg were normal recently -[employer] with no ischemia -she is doing well and tolerating BB therapy but is still noticing the palpitations -I will get a 2 week ziopatch to determine residual PVC load -Continue prescription drug management with Toprol XL 50mg  daily and refills as needed  2.  Paroxysmal atrial tachycardia -palpitations have significantly improved on Toprol XL 50mg  daily -continue Toprol  3.  HTN -her BP is adequately controlled on exam today -Continue prescription drug management with Hyzaar 100-25mg  daily and Toprol XL 25mg  daily>>refilled for 1 year -check BMET and Mag level   Medication Adjustments/Labs and Tests Ordered: Current medicines are reviewed at length with the patient today.  Concerns regarding medicines are outlined above.  Medication changes, Labs and Tests ordered today are listed in the Patient Instructions below.  There are no Patient Instructions on file for this visit.   Signed, Fransico Him, MD  01/17/2021 9:13 AM    [address], [place], [place]  [address] Phone: [phone]; Fax: [phone]

## 2021-01-17 NOTE — Addendum Note (Signed)
Addended by: Antonieta Iba on: 01/17/2021 09:23 AM   Modules accepted: Orders

## 2021-01-20 DIAGNOSIS — I493 Ventricular premature depolarization: Secondary | ICD-10-CM

## 2021-01-20 DIAGNOSIS — I471 Supraventricular tachycardia: Secondary | ICD-10-CM | POA: Diagnosis not present

## 2021-01-25 ENCOUNTER — Other Ambulatory Visit (HOSPITAL_COMMUNITY): Payer: Self-pay

## 2021-02-10 DIAGNOSIS — I471 Supraventricular tachycardia: Secondary | ICD-10-CM | POA: Diagnosis not present

## 2021-02-10 DIAGNOSIS — I493 Ventricular premature depolarization: Secondary | ICD-10-CM | POA: Diagnosis not present

## 2021-02-14 ENCOUNTER — Other Ambulatory Visit (HOSPITAL_COMMUNITY): Payer: Self-pay

## 2021-02-14 ENCOUNTER — Telehealth: Payer: Self-pay | Admitting: Cardiology

## 2021-02-14 MED ORDER — METOPROLOL SUCCINATE ER 50 MG PO TB24
75.0000 mg | ORAL_TABLET | Freq: Every day | ORAL | 3 refills | Status: DC
  Filled 2021-02-14 – 2021-03-13 (×2): qty 135, 90d supply, fill #0

## 2021-02-14 NOTE — Telephone Encounter (Signed)
Pt is returning a call  

## 2021-02-14 NOTE — Telephone Encounter (Signed)
Christina Margarita, MD  02/13/2021 10:39 AM EDT      Heart monitor showed runs of SVT which is a fast HR from the top of the heart - please increase Toprol to 75mg  BID and check BP and HR daily for a week and call with results.  Followup with Melina Copa, PA or Estella Husk, PA in [place]  The patient has been notified of the result and verbalized understanding.  All questions (if any) were answered. Antonieta Iba, RN 02/14/2021 3:39 PM  Rx has been sent in. FU appt has been scheduled.

## 2021-02-21 ENCOUNTER — Other Ambulatory Visit (HOSPITAL_COMMUNITY): Payer: Self-pay

## 2021-02-21 MED ORDER — VENLAFAXINE HCL ER 75 MG PO CP24
75.0000 mg | ORAL_CAPSULE | Freq: Every day | ORAL | 1 refills | Status: DC
  Filled 2021-02-21: qty 90, 90d supply, fill #0
  Filled 2021-05-24: qty 90, 90d supply, fill #1

## 2021-03-13 ENCOUNTER — Other Ambulatory Visit: Payer: Self-pay | Admitting: Family Medicine

## 2021-03-13 ENCOUNTER — Other Ambulatory Visit (HOSPITAL_COMMUNITY): Payer: Self-pay

## 2021-03-13 MED ORDER — ATORVASTATIN CALCIUM 10 MG PO TABS
10.0000 mg | ORAL_TABLET | Freq: Every day | ORAL | 0 refills | Status: DC
  Filled 2021-03-13: qty 90, 90d supply, fill #0

## 2021-03-14 ENCOUNTER — Other Ambulatory Visit (HOSPITAL_COMMUNITY): Payer: Self-pay

## 2021-03-14 MED ORDER — METOPROLOL SUCCINATE ER 50 MG PO TB24
75.0000 mg | ORAL_TABLET | Freq: Two times a day (BID) | ORAL | 3 refills | Status: DC
  Filled 2021-03-14 (×2): qty 180, 60d supply, fill #0
  Filled 2021-05-15: qty 180, 60d supply, fill #1
  Filled 2021-07-12: qty 180, 60d supply, fill #2
  Filled 2021-09-12: qty 180, 60d supply, fill #3

## 2021-03-14 NOTE — Addendum Note (Signed)
Addended by: Antonieta Iba on: 03/14/2021 03:26 PM   Modules accepted: Orders

## 2021-03-18 NOTE — Progress Notes (Signed)
Office Visit    Patient Name: Christina Griffith Date of Encounter: 03/20/2021  PCP:  Jinny Sanders, MD   [place]  Cardiologist:  Fransico Him, MD  Advanced Practice Provider:  No care team member to display Electrophysiologist:  Vickie Epley, MD    Chief Complaint    Christina Griffith is a 70 y.o. female with a hx of asthma, HTN, palpitations, SVT, PVC presents today for follow up after ZIO monitor   Past Medical History    Past Medical History:  Diagnosis Date   Arthritis    spinal stenosis--back pain--hx of herniated cervical disk- but no surgery   Asthma    Cold 10/03/12   pt getting over a cold--still has some head congestion, slight cough   Colon polyps    Endometrial ca [facility])    H/O: hematuria    benign essential hematuria - per urology work up    History of depression    History of pneumonia    Hypertension    Numbness in right leg    pt. states occassional in right leg and right big toe from disc herniations   PAT (paroxysmal atrial tachycardia) ([place])    noted on heart monitor 07/2020   PONV (postoperative nausea and vomiting)    with prior hip replacement   PVC's (premature ventricular contractions)    PVCs, trigeminal PVCs, WCT nonsustained with PVC load 18% by montior 07/2020   Stress incontinence    Wears glasses    Past Surgical History:  Procedure Laterality Date   BREAST SURGERY Right 1980's   breast biopsy   COLONOSCOPY     DENTAL SURGERY  11/2019   EYE SURGERY Bilateral 02/2015   cataracts   HYSTEROSCOPY WITH D & C Left 1/24/204   w/ resection of Endometrial polyps   ROBOTIC ASSISTED TOTAL HYSTERECTOMY WITH BILATERAL SALPINGO OOPHERECTOMY N/A 10/07/2012   Procedure: ROBOTIC ASSISTED TOTAL HYSTERECTOMY WITH BILATERAL SALPINGO OOPHORECTOMY/POSSIBLE LYMPH NODE BIOPSY;  Surgeon: Imagene Gurney A. Alycia Rossetti, MD;  Location: [facility];  Service: Gynecology;  Laterality: N/A;   TOTAL HIP ARTHROPLASTY     right 2002 left 2004    TOTAL HIP REVISION Right 05/09/2015   Procedure: TOTAL HIP REVISION;  Surgeon: Frederik Pear, MD;  Location: [place];  Service: Orthopedics;  Laterality: Right;    Allergies  No Known Allergies  History of Present Illness    Christina Griffith is a 70 y.o. female with a hx of asthma, HTN, palpitations, SVT, PVC last seen 01/17/21.  Previous echocardiogram 06/2020 with low normal LVEF 50-55%, gr1DD, mildly enlarged RA and LA, mild MR. Lab work in the fall 2021 including TSH, Hb was normal. Lexiscan myoview with no ischemia performed 06/2020. She was started on Tropol '25mg'$  QD for PVC suppression. Cardiac MRI with normal LVEF 56%, no LGE, normal RV, mild MR and TR. 2 week ZIO with frequent PVC with burden 18% also with nonsustained atrial tachycardia. Toprol increased to '50mg'$  QD and referred to EP. AAD with Flecainide was discussed but palpitations improved with Toprol and medical therapy continued.   She was seen 01/17/21 noting increased frequency of palpitations. Subsequently she wore a ZIO monitor which showed runs of SVT. Her Toprol was increased to '75mg'$  BID.   She presents today for follow up. Tells within a weak of hitting her maximum dose of Metoprolol tells me her palpitations completely disappeared. She does her morning dose at 6:30am and evening dose at 6:30 pm. She  is careful to change positions slowly to prevent orthostatic hypotension. This is a longstanding symptom and unchanged. No near syncope nor syncope. She does note that she has heat intolerance with Metoprolol. Recently noted while walking the dog. She has learned to adjust her heat exposure. No shortness of breath. No edema, orthopnea, PND.  EKGs/Labs/Other Studies Reviewed:   The following studies were reviewed today:  ZIO 02/10/21 Normal sinus rhythm is the predominant rhythm There are runs of SVT with the longest being 12.9 seconds.   No other sustained arrhythmias.   Patient triggered events were sinus rhythm or during  atrial ectopy.  2D echo 06/2020 IMPRESSIONS    1. Left ventricular ejection fraction, by estimation, is 50 to 55%. The  left ventricle has low normal function. The left ventricle has no regional  wall motion abnormalities. Left ventricular diastolic parameters are  consistent with Grade I diastolic  dysfunction (impaired relaxation).   2. Right ventricular systolic function is normal. The right ventricular  size is mildly enlarged.   3. Left atrial size was mildly dilated.   4. Right atrial size was mildly dilated.   5. The mitral valve is normal in structure. Mild mitral valve  regurgitation. No evidence of mitral stenosis.   6. The aortic valve is normal in structure. Aortic valve regurgitation is  not visualized. No aortic stenosis is present.   7. The inferior vena cava is normal in size with greater than 50%  respiratory variability, suggesting right atrial pressure of 3 mmHg.    Cardiac MRI 07/2020 IMPRESSION: 1. Normal left ventricular size, thickness and systolic function (LVEF = 56%). There are no regional wall motion abnormalities and no late gadolinium enhancement in the left ventricular myocardium. Normal extracellular volume (ECV): 29%.   2. Normal right ventricular size, thickness and systolic function (RVEF = 48%). There are no regional wall motion abnormalities.   3.  Normal left and right atrial size.   4. Normal size of the aortic root, ascending aorta and pulmonary artery.   5.  Mild mitral and trivial tricuspid regurgitation.   There is no evidence for infiltrative or inflammatory cardiomyopathy or arrhythmogenic right ventricular hypertrophy.  EKG:  No EKG is  ordered today.   Recent Labs: 04/28/2020: ALT 24 05/31/2020: Hemoglobin 13.3; Platelets 233.0; TSH 0.94 06/17/2020: BUN 22; Creatinine, Ser 0.79; Magnesium 2.1; Potassium 3.9; Sodium 138  Recent Lipid Panel    Component Value Date/Time   CHOL 166 04/28/2020 0806   TRIG 144.0 04/28/2020 0806    HDL 61.90 04/28/2020 0806   CHOLHDL 3 04/28/2020 0806   VLDL 28.8 04/28/2020 0806   LDLCALC 75 04/28/2020 0806    Home Medications   Current Meds  Medication Sig   albuterol (PROVENTIL HFA;VENTOLIN HFA) 108 (90 Base) MCG/ACT inhaler Inhale 2 puffs into the lungs every 6 (six) hours as needed for wheezing or shortness of breath.   atorvastatin (LIPITOR) 10 MG tablet Take 1 tablet (10 mg total) by mouth daily.   Calcium Carb-Cholecalciferol 600-800 MG-UNIT TABS Take 1 tablet by mouth daily.   fexofenadine (ALLEGRA) 180 MG tablet Take 180 mg by mouth daily as needed for allergies or rhinitis.   ibuprofen (ADVIL,MOTRIN) 200 MG tablet Take 600 mg by mouth every 8 (eight) hours as needed.   losartan-hydrochlorothiazide (HYZAAR) 100-25 MG tablet TAKE 1 TABLET BY MOUTH DAILY.   magnesium gluconate (MAGONATE) 500 MG tablet Take 500 mg by mouth daily.   meloxicam (MOBIC) 15 MG tablet TAKE 1 TABLET (15 MG  TOTAL) BY MOUTH DAILY.   metoprolol succinate (TOPROL-XL) 50 MG 24 hr tablet Take 1.5 tablets (75 mg total) by mouth in the morning and at bedtime. Take with or immediately following a meal.   Multiple Vitamin (MULTIVITAMIN) tablet Take 1 tablet by mouth daily.   Polyethylene Glycol 3350 (MIRALAX PO) Take 17 g by mouth as needed.    Triamcinolone Acetonide (NASACORT ALLERGY 24HR NA) Place 1 spray into the nose as needed.    venlafaxine XR (EFFEXOR-XR) 75 MG 24 hr capsule Take 1 capsule (75 mg total) by mouth daily.   Zoster Vaccine Adjuvanted [facility]) injection TO BE ADMINISTERED BY PHARMACIST     Review of Systems      All other systems reviewed and are otherwise negative except as noted above.  Physical Exam    VS:  BP 130/72 (BP Location: Left Arm, Patient Position: Sitting, Cuff Size: Normal)   Pulse 91   Ht '5\' 7"'$  (1.702 m)   Wt 166 lb 6.4 oz (75.5 kg)   SpO2 96%   BMI 26.06 kg/m  , BMI Body mass index is 26.06 kg/m.  Wt Readings from Last 3 Encounters:  03/20/21 166 lb 6.4  oz (75.5 kg)  01/17/21 166 lb (75.3 kg)  10/04/20 163 lb (73.9 kg)     GEN: Well nourished, well developed, in no acute distress. HEENT: normal. Neck: Supple, no JVD, carotid bruits, or masses. Cardiac: RRR, no murmurs, rubs, or gallops. No clubbing, cyanosis, edema.  Radials/PT 2+ and equal bilaterally.  Respiratory:  Respirations regular and unlabored, clear to auscultation bilaterally. GI: Soft, nontender, nondistended. MS: No deformity or atrophy. Skin: Warm and dry, no rash. Neuro:  Strength and sensation are intact. Psych: Normal affect.  Assessment & Plan    Palpitations / SVT / PVC / PAT - Symptoms well controlled. Continue Toprol '75mg'$  twice daily. Continue to avoid caffeine, dehydration, alcohol, manage stress well.    HTN - BP well controlled. Continue current antihypertensive regimen.    Disposition: Follow up  07/2021  with Dr. Radford Pax or APP.  Signed, Loel Dubonnet, NP 03/20/2021, 9:22 AM [place]

## 2021-03-20 ENCOUNTER — Ambulatory Visit (HOSPITAL_BASED_OUTPATIENT_CLINIC_OR_DEPARTMENT_OTHER): Payer: PPO | Admitting: Family

## 2021-03-20 ENCOUNTER — Other Ambulatory Visit: Payer: Self-pay

## 2021-03-20 ENCOUNTER — Encounter (HOSPITAL_BASED_OUTPATIENT_CLINIC_OR_DEPARTMENT_OTHER): Payer: Self-pay | Admitting: Family

## 2021-03-20 VITALS — BP 130/72 | HR 91 | Ht 67.0 in | Wt 166.4 lb

## 2021-03-20 DIAGNOSIS — I10 Essential (primary) hypertension: Secondary | ICD-10-CM | POA: Diagnosis not present

## 2021-03-20 DIAGNOSIS — I493 Ventricular premature depolarization: Secondary | ICD-10-CM | POA: Diagnosis not present

## 2021-03-20 DIAGNOSIS — I471 Supraventricular tachycardia: Secondary | ICD-10-CM

## 2021-03-20 NOTE — Patient Instructions (Signed)
Medication Instructions:  Continue your current medications.   *If you need a refill on your cardiac medications before your next appointment, please call your pharmacy*  Lab Work: None ordered today.   Testing/Procedures: None ordered today.    Follow-Up: At [facility], you and your health needs are our priority.  As part of our continuing mission to provide you with exceptional heart care, we have created designated Provider Care Teams.  These Care Teams include your primary Cardiologist (physician) and Advanced Practice Providers (APPs -  Physician Assistants and Nurse Practitioners) who all work together to provide you with the care you need, when you need it.  We recommend signing up for the patient portal called "MyChart".  Sign up information is provided on this After Visit Summary.  MyChart is used to connect with patients for Virtual Visits (Telemedicine).  Patients are able to view lab/test results, encounter notes, upcoming appointments, etc.  Non-urgent messages can be sent to your provider as well.   To learn more about what you can do with MyChart, go to [email or web address].    Your next appointment:   07/2021 with Dr. Radford Pax or APP AND 10/2021 with Dr. Quentin Ore   Other Instructions  Supraventricular Tachycardia, Adult Supraventricular tachycardia (SVT) is a kind of abnormal heartbeat. It makes your heart beat very fast. This may last for a short time and then return tonormal, or it may last longer. A normal resting heartbeat is 60-100 times a minute. This condition can make your heart beat more than 150 times a minute. Times of having a fast heartbeat (episodes) can be scary, but they are usually not dangerous. In some cases, they may lead to heart failure if they: Happen many times a day. Last longer than a few seconds. What are the causes?  This condition happens when electrical signals are sent out from areas of theheart that do not normally send signals for  the heartbeat. What increases the risk? You are more likely to develop this condition if you are: Middle aged or younger. Female. The following factors may also make you more likely to develop this condition: Stress. Feeling worried or nervous (anxiety). Tiredness. Smoking. Stimulant drugs, such as cocaine and methamphetamine. Alcohol. Caffeine. Pregnancy. Having certain medical conditions. What are the signs or symptoms? A pounding heart. A feeling that your heart is skipping beats (palpitations). Weakness. Trouble getting enough air. Pain or tightness in your chest. Dizziness or feeling like you are going to pass out (faint). Feeling worried or nervous. Sweating. Feeling like you may vomit (nausea). Passing out. Tiredness. Sometimes, there are no symptoms. How is this treated? Treatment may include: Vagal nerve stimulation. Ways to do this include: Holding your breath and pushing, as though you are pooping (having a bowel movement). Massaging an area on one side of your neck. Do not try this yourself. Only a doctor should do this. If done the wrong way, it can lead to a stroke. Bending forward with your head between your legs. Coughing while bending forward with your head between your legs. Putting an ice-cold, wet towel on your face. Medicines that prevent attacks. Medicine to stop an attack given through an IV tube at the hospital. A small electric shock (cardioversion) that stops an attack. A procedure to get rid of cells in the area that is causing the fast heartbeats (radiofrequency ablation). If you do not have symptoms, you may not need treatment. Follow these instructions at home: Stress Avoid things that make you feel stressed. To  deal with stress, try: Doing yoga or meditation. Being out in nature. Listening to relaxing music. Doing deep breathing. Taking steps to be healthy, such as getting lots of sleep, exercising, and eating a balanced diet. Talking  with a mental health doctor. Lifestyle  Try to get at least 7 hours of sleep each night. Do not smoke or use any products that contain nicotine or tobacco. If you need help quitting, ask your doctor. Do not drink alcohol if it gives you a fast heartbeat. If alcohol does not seem to give you a fast heartbeat, limit your alcohol use. If you drink alcohol: Limit how much you have to: 0-1 drink a day for women who are not pregnant. 0-2 drinks a day for men. Know how much alcohol is in your drink. In the [place] one drink equals one 12 oz bottle of beer (355 mL), one 5 oz glass of wine (148 mL), or one 1 oz glass of hard liquor (44 mL). Be aware of how caffeine affects you. If caffeine gives you a fast heartbeat, do not eat, drink, or use anything with caffeine in it. If caffeine does not seem to give you a fast heartbeat, limit how much caffeine you eat, drink, or use. Do not use stimulant drugs. If you need help quitting, ask your doctor.  General instructions Stay at a healthy weight. Exercise regularly. Ask your doctor about good activities for you. Try one or a mixture of these: 150 minutes a week of gentle exercise, like walking or yoga. 75 minutes a week of exercise that is very active, like running or swimming. Do vagus nerve treatments to slow down your heartbeat as told by your doctor. Take over-the-counter and prescription medicines only as told by your doctor. Keep all follow-up visits. Contact a doctor if: You have a fast heartbeat more often. Times of having a fast heartbeat last longer than before. Home treatments to slow down your heartbeat do not help. You have new symptoms. Get help right away if: You have chest pain. Your symptoms get worse. You have trouble breathing. Your heart beats very fast for more than 20 minutes. You pass out. These symptoms may be an emergency. Get medical help right away. Call your local emergency services (911 in the [place] Do not wait to  see if the symptoms will go away. Do not drive yourself to the hospital. Summary SVT is a type of abnormal heartbeat. This condition can make your heart beat more than 150 times a minute. If you do not have symptoms, you may not need treatment. This information is not intended to replace advice given to you by your health care provider. Make sure you discuss any questions you have with your healthcare provider. Document Revised: 03/05/2020 Document Reviewed: 03/05/2020 Elsevier Patient Education  [place].

## 2021-04-24 ENCOUNTER — Other Ambulatory Visit (HOSPITAL_COMMUNITY): Payer: Self-pay

## 2021-04-27 ENCOUNTER — Telehealth: Payer: Self-pay | Admitting: Family Medicine

## 2021-04-27 DIAGNOSIS — E7800 Pure hypercholesterolemia, unspecified: Secondary | ICD-10-CM

## 2021-04-27 NOTE — Telephone Encounter (Signed)
labs

## 2021-04-27 NOTE — Telephone Encounter (Signed)
-----   Message from Ellamae Sia sent at 04/27/2021  7:58 AM EDT ----- Regarding: Lab orders for Monday, 10.10.22 Patient is scheduled for CPX labs, please order future labs, Thanks , Karna Christmas

## 2021-04-29 ENCOUNTER — Ambulatory Visit (INDEPENDENT_AMBULATORY_CARE_PROVIDER_SITE_OTHER): Payer: PPO

## 2021-04-29 DIAGNOSIS — Z0000 Encounter for general adult medical examination without abnormal findings: Secondary | ICD-10-CM | POA: Diagnosis not present

## 2021-04-29 NOTE — Progress Notes (Signed)
Subjective:   Christina Griffith is a 70 y.o. female who presents for Medicare Annual (Subsequent) preventive examination.  I connected with  Christina Griffith on 04/29/21 by a audio enabled telemedicine application and verified that I am speaking with the correct person using two identifiers.   I discussed the limitations of evaluation and management by telemedicine. The patient expressed understanding and agreed to proceed. Location of patient: Home Location of provider: Office  Persons that are participating in the visit Christina Griffith (Patient) and Christina Griffith RMA.   Review of Systems    Defer to PCP       Objective:    There were no vitals filed for this visit. There is no height or weight on file to calculate BMI.  Advanced Directives 04/26/2020 04/24/2019 07/01/2018 04/18/2018 07/03/2017 04/05/2017 04/03/2016  Does Patient Have a Medical Advance Directive? Yes Yes Yes Yes Yes Yes Yes  Type of Paramedic of [place];Living will [place];Living will Living will [place];Living will Living will [place];Living will [place];Living will  Does patient want to make changes to medical advance directive? - - No - Patient declined - - No - Patient declined No - Patient declined  Copy of [place] in Chart? Yes - validated most recent copy scanned in chart (See row information) Yes - validated most recent copy scanned in chart (See row information) - Yes Yes Yes Yes  Pre-existing out of facility DNR order (yellow form or pink MOST form) - - - - - - -    Current Medications (verified) Outpatient Encounter Medications as of 04/29/2021  Medication Sig   albuterol (PROVENTIL HFA;VENTOLIN HFA) 108 (90 Base) MCG/ACT inhaler Inhale 2 puffs into the lungs every 6 (six) hours as needed for wheezing or shortness of breath.   atorvastatin (LIPITOR) 10 MG tablet Take 1 tablet (10  mg total) by mouth daily.   Calcium Carb-Cholecalciferol 600-800 MG-UNIT TABS Take 1 tablet by mouth daily.   fexofenadine (ALLEGRA) 180 MG tablet Take 180 mg by mouth daily as needed for allergies or rhinitis.   ibuprofen (ADVIL,MOTRIN) 200 MG tablet Take 600 mg by mouth every 8 (eight) hours as needed.   losartan-hydrochlorothiazide (HYZAAR) 100-25 MG tablet TAKE 1 TABLET BY MOUTH DAILY.   magnesium gluconate (MAGONATE) 500 MG tablet Take 500 mg by mouth daily.   meloxicam (MOBIC) 15 MG tablet TAKE 1 TABLET (15 MG TOTAL) BY MOUTH DAILY.   metoprolol succinate (TOPROL-XL) 50 MG 24 hr tablet Take 1.5 tablets (75 mg total) by mouth in the morning and at bedtime. Take with or immediately following a meal.   Multiple Vitamin (MULTIVITAMIN) tablet Take 1 tablet by mouth daily.   PFIZER-BIONT COVID-19 VAC-TRIS SUSP injection    Polyethylene Glycol 3350 (MIRALAX PO) Take 17 g by mouth as needed.    Triamcinolone Acetonide (NASACORT ALLERGY 24HR NA) Place 1 spray into the nose as needed.    venlafaxine XR (EFFEXOR-XR) 75 MG 24 hr capsule Take 1 capsule (75 mg total) by mouth daily.   Zoster Vaccine Adjuvanted [facility]) injection TO BE ADMINISTERED BY PHARMACIST   No facility-administered encounter medications on file as of 04/29/2021.    Allergies (verified) Patient has no known allergies.   History: Past Medical History:  Diagnosis Date   Arthritis    spinal stenosis--back pain--hx of herniated cervical disk- but no surgery   Asthma    Cold 10/03/12   pt  getting over a cold--still has some head congestion, slight cough   Colon polyps    Endometrial ca [facility])    H/O: hematuria    benign essential hematuria - per urology work up    History of depression    History of pneumonia    Hypertension    Numbness in right leg    pt. states occassional in right leg and right big toe from disc herniations   PAT (paroxysmal atrial tachycardia) ([place])    noted on heart monitor 07/2020   PONV  (postoperative nausea and vomiting)    with prior hip replacement   PVC's (premature ventricular contractions)    PVCs, trigeminal PVCs, WCT nonsustained with PVC load 18% by montior 07/2020   Stress incontinence    Wears glasses    Past Surgical History:  Procedure Laterality Date   BREAST SURGERY Right 1980's   breast biopsy   COLONOSCOPY     DENTAL SURGERY  11/2019   EYE SURGERY Bilateral 02/2015   cataracts   HYSTEROSCOPY WITH D & C Left 1/24/204   w/ resection of Endometrial polyps   ROBOTIC ASSISTED TOTAL HYSTERECTOMY WITH BILATERAL SALPINGO OOPHERECTOMY N/A 10/07/2012   Procedure: ROBOTIC ASSISTED TOTAL HYSTERECTOMY WITH BILATERAL SALPINGO OOPHORECTOMY/POSSIBLE LYMPH NODE BIOPSY;  Surgeon: Christina Gurney A. Alycia Rossetti, MD;  Location: [facility];  Service: Gynecology;  Laterality: N/A;   TOTAL HIP ARTHROPLASTY     right 2002 left 2004   TOTAL HIP REVISION Right 05/09/2015   Procedure: TOTAL HIP REVISION;  Surgeon: Christina Pear, MD;  Location: [facility];  Service: Orthopedics;  Laterality: Right;   Family History  Problem Relation Age of Onset   Hypertension Mother    Cancer Father        bladder and prostate   Hyperlipidemia Father    Cancer Maternal Grandmother        colon   Breast cancer Cousin    Social History   Socioeconomic History   Marital status: Single    Spouse name: Not on file   Number of children: Not on file   Years of education: Not on file   Highest education level: Not on file  Occupational History   Occupation: Engineer, mining    Employer: [employer]  Tobacco Use   Smoking status: Never   Smokeless tobacco: Never  Vaping Use   Vaping Use: Never used  Substance and Sexual Activity   Alcohol use: Yes    Alcohol/week: 3.0 standard drinks    Types: 3 Glasses of wine per week    Comment: 3 x a week   Drug use: No   Sexual activity: Not on file  Other Topics Concern   Not on file  Social History Narrative   Regular exercise- yes, 5-6 days a week, yoga    Diet: healthy   Social Determinants of Health   Financial Resource Strain: Not on file  Food Insecurity: Not on file  Transportation Needs: Not on file  Physical Activity: Not on file  Stress: Not on file  Social Connections: Not on file    Tobacco Counseling Counseling given: Not Answered   Clinical Intake:                 Diabetic?No         Activities of Daily Living No flowsheet data found.  Patient Care Team: Jinny Sanders, MD as PCP - General (Family Medicine) Sueanne Margarita, MD as PCP - Cardiology (Cardiology) Vickie Epley,  MD as PCP - Electrophysiology (Cardiology)  Indicate any recent Medical Services you may have received from other than [facility] providers in the past year (date may be approximate).     Assessment:   This is a routine wellness examination for [place].  Hearing/Vision screen No results found.  Dietary issues and exercise activities discussed:     Goals Addressed   None   Depression Screen PHQ 2/9 Scores 04/26/2020 04/24/2019 04/18/2018 04/05/2017 04/03/2016 04/03/2016 03/29/2015  PHQ - 2 Score 0 0 0 0 0 0 0  PHQ- 9 Score 0 0 0 0 - - -    Fall Risk Fall Risk  04/26/2020 04/24/2019 04/18/2018 04/05/2017 04/03/2016  Falls in the past year? 1 0 No No Yes  Comment tripped on stairs - - - -  Number falls in past yr: 1 0 - - 1  Injury with Fall? 0 - - - No  Risk for fall due to : History of fall(s) Medication side effect - - -  Follow up Falls evaluation completed;Falls prevention discussed Falls evaluation completed;Falls prevention discussed - - Falls prevention discussed    FALL RISK PREVENTION PERTAINING TO THE HOME:  Any stairs in or around the home? Yes  If so, are there any without handrails? Yes  Home free of loose throw rugs in walkways, pet beds, electrical cords, etc? No  Adequate lighting in your home to reduce risk of falls? Yes   ASSISTIVE DEVICES UTILIZED TO PREVENT FALLS:  Life alert? No  Use of a  cane, walker or w/c? No  Grab bars in the bathroom? No  Shower chair or bench in shower? Yes  Elevated toilet seat or a handicapped toilet? No   TIMED UP AND GO:  Was the test performed? No .      Cognitive Function: MMSE - Mini Mental State Exam 04/26/2020 04/24/2019 04/18/2018 04/05/2017  Orientation to time 5 5 5 5   Orientation to Place 5 5 5 5   Registration 3 3 3 3   Attention/ Calculation 5 5 0 0  Recall 3 3 3 3   Language- name 2 objects - - 0 0  Language- repeat 1 1 1 1   Language- follow 3 step command - - 3 3  Language- read & follow direction - - 0 0  Write a sentence - - 0 0  Copy design - - 0 0  Total score - - 20 20        Immunizations Immunization History  Administered Date(s) Administered   Fluad Quad(high Dose 65+) 05/05/2020   Influenza Whole 06/06/2010   Influenza,inj,Quad PF,6+ Mos 04/15/2015, 04/30/2016, 04/18/2018, 04/17/2019   PFIZER(Purple Top)SARS-COV-2 Vaccination 08/20/2019, 09/09/2019, 05/14/2020   Pneumococcal Conjugate-13 03/16/2014   Pneumococcal Polysaccharide-23 12/14/2011, 04/05/2017   Td 08/29/2007   Zoster Recombinat (Shingrix) 10/26/2020, 01/13/2021   Zoster, Live 11/24/2010    TDAP status: Up to date  Flu Vaccine status: Due, Education has been provided regarding the importance of this vaccine. Advised may receive this vaccine at local pharmacy or Health Dept. Aware to provide a copy of the vaccination record if obtained from local pharmacy or Health Dept. Verbalized acceptance and understanding.  Pneumococcal vaccine status: Up to date  Covid-19 vaccine status: Declined, Education has been provided regarding the importance of this vaccine but patient still declined. Advised may receive this vaccine at local pharmacy or Health Dept.or vaccine clinic. Aware to provide a copy of the vaccination record if obtained from local pharmacy or Health Dept. Verbalized acceptance and understanding.  Qualifies for Shingles Vaccine? Yes   Zostavax  completed Yes   Shingrix Completed?: Yes  Screening Tests Health Maintenance  Topic Date Due   COLONOSCOPY (Pts 45-74yrs Insurance coverage will need to be confirmed)  08/04/2020   COVID-19 Vaccine (4 - Booster for [place] series) 08/06/2020   INFLUENZA VACCINE  03/06/2021   TETANUS/TDAP  04/26/2024 (Originally 08/28/2017)   MAMMOGRAM  09/29/2021   DEXA SCAN  Completed   Hepatitis C Screening  Completed   Zoster Vaccines- Shingrix  Completed   HPV VACCINES  Aged Out    Health Maintenance  Health Maintenance Due  Topic Date Due   COLONOSCOPY (Pts 45-56yrs Insurance coverage will need to be confirmed)  08/04/2020   COVID-19 Vaccine (4 - Booster for [place] series) 08/06/2020   INFLUENZA VACCINE  03/06/2021    Colorectal cancer screening: Referral to GI placed 5 years. Pt aware the office will call re: appt.  Mammogram status: Completed 1. Repeat every year  Bone Density status: Completed Yes. Results reflect: Bone density results: OSTEOPOROSIS. Repeat every   years.  Lung Cancer Screening: (Low Dose CT Chest recommended if Age 36-80 years, 30 pack-year currently smoking OR have quit w/in 15years.) does qualify.   Lung Cancer Screening Referral: NO  Additional Screening:  Hepatitis C Screening: does qualify; Completed Yes  Vision Screening: Recommended annual ophthalmology exams for early detection of glaucoma and other disorders of the eye. Is the patient up to date with their annual eye exam?  Yes  Who is the provider or what is the name of the office in which the patient attends annual eye exams? 4 months ago If pt is not established with a provider, would they like to be referred to a provider to establish care? No .   Dental Screening: Recommended annual dental exams for proper oral hygiene  Community Resource Referral / Chronic Care Management: CRR required this visit?  No   CCM required this visit?  No      Plan:     I have personally reviewed and noted the  following in the patient's chart:   Medical and social history Use of alcohol, tobacco or illicit drugs  Current medications and supplements including opioid prescriptions.  Functional ability and status Nutritional status Physical activity Advanced directives List of other physicians Hospitalizations, surgeries, and ER visits in previous 12 months Vitals Screenings to include cognitive, depression, and falls Referrals and appointments  In addition, I have reviewed and discussed with patient certain preventive protocols, quality metrics, and best practice recommendations. A written personalized care plan for preventive services as well as general preventive health recommendations were provided to patient.     Christina Griffith, [place]   04/29/2021   Nurse Notes: Non face to face 60 minute visit.   Ms. Rail , Thank you for taking time to come for your Medicare Wellness Visit. I appreciate your ongoing commitment to your health goals. Please review the following plan we discussed and let me know if I can assist you in the future.   These are the goals we discussed:  Goals      Increase physical activity     Starting 04/18/2018, I will continue to walk at least 60 minutes daily.      Patient Stated     04/24/2019, Patient wants to improve her diet and eat better.     Patient Stated     04/26/2020, I will continue to do yoga 3 days a week and exercise bike 2  days a week for about 30 minutes.         This is a list of the screening recommended for you and due dates:  Health Maintenance  Topic Date Due   Colon Cancer Screening  08/04/2020   COVID-19 Vaccine (4 - Booster for Pfizer series) 08/06/2020   Flu Shot  03/06/2021   Tetanus Vaccine  04/26/2024*   Mammogram  09/29/2021   DEXA scan (bone density measurement)  Completed   Hepatitis C Screening: USPSTF Recommendation to screen - Ages 21-79 yo.  Completed   Zoster (Shingles) Vaccine  Completed   HPV Vaccine  Aged Out  *Topic  was postponed. The date shown is not the original due date.

## 2021-04-29 NOTE — Patient Instructions (Signed)
Health Maintenance, Female Adopting a healthy lifestyle and getting preventive care are important in promoting health and wellness. Ask your health care provider about: The right schedule for you to have regular tests and exams. Things you can do on your own to prevent diseases and keep yourself healthy. What should I know about diet, weight, and exercise? Eat a healthy diet  Eat a diet that includes plenty of vegetables, fruits, low-fat dairy products, and lean protein. Do not eat a lot of foods that are high in solid fats, added sugars, or sodium. Maintain a healthy weight Body mass index (BMI) is used to identify weight problems. It estimates body fat based on height and weight. Your health care provider can help determine your BMI and help you achieve or maintain a healthy weight. Get regular exercise Get regular exercise. This is one of the most important things you can do for your health. Most adults should: Exercise for at least 150 minutes each week. The exercise should increase your heart rate and make you sweat (moderate-intensity exercise). Do strengthening exercises at least twice a week. This is in addition to the moderate-intensity exercise. Spend less time sitting. Even light physical activity can be beneficial. Watch cholesterol and blood lipids Have your blood tested for lipids and cholesterol at 71 years of age, then have this test every 5 years. Have your cholesterol levels checked more often if: Your lipid or cholesterol levels are high. You are older than 71 years of age. You are at high risk for heart disease. What should I know about cancer screening? Depending on your health history and family history, you may need to have cancer screening at various ages. This may include screening for: Breast cancer. Cervical cancer. Colorectal cancer. Skin cancer. Lung cancer. What should I know about heart disease, diabetes, and high blood pressure? Blood pressure and heart  disease High blood pressure causes heart disease and increases the risk of stroke. This is more likely to develop in people who have high blood pressure readings, are of African descent, or are overweight. Have your blood pressure checked: Every 3-5 years if you are 18-39 years of age. Every year if you are 40 years old or older. Diabetes Have regular diabetes screenings. This checks your fasting blood sugar level. Have the screening done: Once every three years after age 40 if you are at a normal weight and have a low risk for diabetes. More often and at a younger age if you are overweight or have a high risk for diabetes. What should I know about preventing infection? Hepatitis B If you have a higher risk for hepatitis B, you should be screened for this virus. Talk with your health care provider to find out if you are at risk for hepatitis B infection. Hepatitis C Testing is recommended for: Everyone born from 1945 through 1965. Anyone with known risk factors for hepatitis C. Sexually transmitted infections (STIs) Get screened for STIs, including gonorrhea and chlamydia, if: You are sexually active and are younger than 71 years of age. You are older than 71 years of age and your health care provider tells you that you are at risk for this type of infection. Your sexual activity has changed since you were last screened, and you are at increased risk for chlamydia or gonorrhea. Ask your health care provider if you are at risk. Ask your health care provider about whether you are at high risk for HIV. Your health care provider may recommend a prescription medicine   to help prevent HIV infection. If you choose to take medicine to prevent HIV, you should first get tested for HIV. You should then be tested every 3 months for as long as you are taking the medicine. Pregnancy If you are about to stop having your period (premenopausal) and you may become pregnant, seek counseling before you get  pregnant. Take 400 to 800 micrograms (mcg) of folic acid every day if you become pregnant. Ask for birth control (contraception) if you want to prevent pregnancy. Osteoporosis and menopause Osteoporosis is a disease in which the bones lose minerals and strength with aging. This can result in bone fractures. If you are 65 years old or older, or if you are at risk for osteoporosis and fractures, ask your health care provider if you should: Be screened for bone loss. Take a calcium or vitamin D supplement to lower your risk of fractures. Be given hormone replacement therapy (HRT) to treat symptoms of menopause. Follow these instructions at home: Lifestyle Do not use any products that contain nicotine or tobacco, such as cigarettes, e-cigarettes, and chewing tobacco. If you need help quitting, ask your health care provider. Do not use street drugs. Do not share needles. Ask your health care provider for help if you need support or information about quitting drugs. Alcohol use Do not drink alcohol if: Your health care provider tells you not to drink. You are pregnant, may be pregnant, or are planning to become pregnant. If you drink alcohol: Limit how much you use to 0-1 drink a day. Limit intake if you are breastfeeding. Be aware of how much alcohol is in your drink. In the [place] one drink equals one 12 oz bottle of beer (355 mL), one 5 oz glass of wine (148 mL), or one 1 oz glass of hard liquor (44 mL). General instructions Schedule regular health, dental, and eye exams. Stay current with your vaccines. Tell your health care provider if: You often feel depressed. You have ever been abused or do not feel safe at home. Summary Adopting a healthy lifestyle and getting preventive care are important in promoting health and wellness. Follow your health care provider's instructions about healthy diet, exercising, and getting tested or screened for diseases. Follow your health care provider's  instructions on monitoring your cholesterol and blood pressure. This information is not intended to replace advice given to you by your health care provider. Make sure you discuss any questions you have with your health care provider. Document Revised: 09/30/2020 Document Reviewed: 07/16/2018 Elsevier Patient Education  2022 Elsevier Inc.  

## 2021-05-02 NOTE — Progress Notes (Signed)
I reviewed health advisor's note, was available for consultation, and agree with documentation and plan.    I was available by phone and digitally at time of visit.

## 2021-05-09 ENCOUNTER — Ambulatory Visit: Payer: PPO

## 2021-05-15 ENCOUNTER — Other Ambulatory Visit: Payer: PPO

## 2021-05-15 ENCOUNTER — Other Ambulatory Visit (HOSPITAL_COMMUNITY): Payer: Self-pay

## 2021-05-23 ENCOUNTER — Encounter: Payer: Self-pay | Admitting: Family Medicine

## 2021-05-23 ENCOUNTER — Other Ambulatory Visit: Payer: Self-pay

## 2021-05-23 ENCOUNTER — Ambulatory Visit (INDEPENDENT_AMBULATORY_CARE_PROVIDER_SITE_OTHER): Payer: PPO | Admitting: Family Medicine

## 2021-05-23 ENCOUNTER — Other Ambulatory Visit (HOSPITAL_COMMUNITY)
Admission: RE | Admit: 2021-05-23 | Discharge: 2021-05-23 | Disposition: A | Discharge status: Home / Self Care | Payer: PPO | Source: Ambulatory Visit | Attending: Family Medicine | Admitting: Family Medicine

## 2021-05-23 VITALS — BP 120/66 | HR 84 | Temp 98.0°F | Ht 67.0 in | Wt 165.2 lb

## 2021-05-23 DIAGNOSIS — E7800 Pure hypercholesterolemia, unspecified: Secondary | ICD-10-CM

## 2021-05-23 DIAGNOSIS — Z1151 Encounter for screening for human papillomavirus (HPV): Secondary | ICD-10-CM | POA: Insufficient documentation

## 2021-05-23 DIAGNOSIS — I10 Essential (primary) hypertension: Secondary | ICD-10-CM | POA: Diagnosis not present

## 2021-05-23 DIAGNOSIS — Z01419 Encounter for gynecological examination (general) (routine) without abnormal findings: Secondary | ICD-10-CM | POA: Insufficient documentation

## 2021-05-23 DIAGNOSIS — Z0000 Encounter for general adult medical examination without abnormal findings: Secondary | ICD-10-CM | POA: Diagnosis not present

## 2021-05-23 DIAGNOSIS — F411 Generalized anxiety disorder: Secondary | ICD-10-CM | POA: Diagnosis not present

## 2021-05-23 DIAGNOSIS — Z124 Encounter for screening for malignant neoplasm of cervix: Secondary | ICD-10-CM | POA: Diagnosis not present

## 2021-05-23 DIAGNOSIS — I471 Supraventricular tachycardia: Secondary | ICD-10-CM

## 2021-05-23 DIAGNOSIS — E348 Other specified endocrine disorders: Secondary | ICD-10-CM

## 2021-05-23 LAB — COMPREHENSIVE METABOLIC PANEL
ALT: 38 U/L — ABNORMAL HIGH (ref 0–35)
AST: 33 U/L (ref 0–37)
Albumin: 4.4 g/dL (ref 3.5–5.2)
Alkaline Phosphatase: 72 U/L (ref 39–117)
BUN: 18 mg/dL (ref 6–23)
CO2: 31 mEq/L (ref 19–32)
Calcium: 9.9 mg/dL (ref 8.4–10.5)
Chloride: 100 mEq/L (ref 96–112)
Creatinine, Ser: 0.77 mg/dL (ref 0.40–1.20)
GFR: 77.56 mL/min (ref >60.00)
Glucose, Bld: 94 mg/dL (ref 70–99)
Potassium: 3.8 mEq/L (ref 3.5–5.1)
Sodium: 138 mEq/L (ref 135–145)
Total Bilirubin: 0.5 mg/dL (ref 0.2–1.2)
Total Protein: 7.1 g/dL (ref 6.0–8.3)

## 2021-05-23 LAB — LIPID PANEL
Cholesterol: 171 mg/dL (ref 0–200)
HDL: 55.9 mg/dL (ref >39.00)
LDL Cholesterol: 78 mg/dL (ref 0–99)
NonHDL: 115.09
Total CHOL/HDL Ratio: 3
Triglycerides: 186 mg/dL — ABNORMAL HIGH (ref 0.0–149.0)
VLDL: 37.2 mg/dL (ref 0.0–40.0)

## 2021-05-23 NOTE — Patient Instructions (Signed)
Please stop at the lab to have labs drawn. Please call the location of your choice from the menu below to schedule your Mammogram and/or Bone Density appointment.    [facility]                      Phone:  [phone], [place] [address]                                                             Services: Traditional and 3D Mammogram, [place] [facility]                 Phone: [phone] [address], [place] [address]    Service: Bone Density ONLY   *this site does NOT perform mammograms  [place]                        Phone:  [phone] [address], [place] [address]                                            Services:  3D Mammogram and [place] at [facility]   Phone:  [phone]   [place], [place] [address]                                            Services: 3D Mammogram and [place]  [place] at [facility])  Phone:  [phone]   [address] [place], [place] [address]  Services:  3D Mammogram and Bone Density

## 2021-05-23 NOTE — Assessment & Plan Note (Signed)
Stable, chronic.  Continue current medication.  Losartan/HCTZ 100/25 mg daily Toprol XL 75 mg BID

## 2021-05-23 NOTE — Progress Notes (Signed)
Patient ID: Christina Griffith, female    DOB: 31-Mar-1951, 70 y.o.   MRN: [id number]  This visit was conducted in person.  Pulse 84   Temp 98 F (36.7 C) (Temporal)   Ht 5\' 7"  (1.702 m)   Wt 165 lb 4 oz (75 kg)   SpO2 99%   BMI 25.88 kg/m    CC:  Chief Complaint  Patient presents with   Annual Exam    Part 2-MWV on 04/29/2021    Subjective:   HPI: Christina Griffith is a 70 y.o. female presenting on 05/23/2021 for Annual Exam (Part 2-MWV on 04/29/2021)  The patient presents for annual medicare wellness, complete physical and review of chronic health problems. He/She also has the following acute concerns today:  None  The patient saw a LPN or RN for medicare wellness visit.  Prevention and wellness was reviewed in detail. Note reviewed and important notes copied below.  PAT, PVC, PSVT followed by cardiology. Last OV note reviewed from 03/20/2021. Symptoms control on toprol 75 mg BID. Does feel fatigued with toprol.   Due for cholesterol eval. Hypertension:   Good control on current regimen.  BP Readings from Last 3 Encounters:  05/23/21 120/66  03/20/21 130/72  01/17/21 (!) 122/52  Using medication without problems or lightheadedness:  none Chest pain with exertion: none Edema:none Short of breath:none Average home BPs: Other issues:  Exercise: walking daily  Diet: moderate     Flowsheet Row Clinical Support from 04/29/2021 in [place] at [facility] Total Score 0        Relevant past medical, surgical, family and social history reviewed and updated as indicated. Interim medical history since our last visit reviewed. Allergies and medications reviewed and updated. Outpatient Medications Prior to Visit  Medication Sig Dispense Refill   albuterol (PROVENTIL HFA;VENTOLIN HFA) 108 (90 Base) MCG/ACT inhaler Inhale 2 puffs into the lungs every 6 (six) hours as needed for wheezing or shortness of breath. 1 Inhaler 2   atorvastatin (LIPITOR)  10 MG tablet Take 1 tablet (10 mg total) by mouth daily. 90 tablet 0   Calcium Carb-Cholecalciferol 600-800 MG-UNIT TABS Take 1 tablet by mouth daily.     fexofenadine (ALLEGRA) 180 MG tablet Take 180 mg by mouth daily as needed for allergies or rhinitis.     losartan-hydrochlorothiazide (HYZAAR) 100-25 MG tablet TAKE 1 TABLET BY MOUTH DAILY. 90 tablet 1   magnesium gluconate (MAGONATE) 500 MG tablet Take 500 mg by mouth daily.     meloxicam (MOBIC) 15 MG tablet TAKE 1 TABLET (15 MG TOTAL) BY MOUTH DAILY. 30 tablet 0   metoprolol succinate (TOPROL-XL) 50 MG 24 hr tablet Take 1.5 tablets (75 mg total) by mouth in the morning and at bedtime. Take with or immediately following a meal. 180 tablet 3   Multiple Vitamin (MULTIVITAMIN) tablet Take 1 tablet by mouth daily.     PFIZER-BIONT COVID-19 VAC-TRIS SUSP injection      Polyethylene Glycol 3350 (MIRALAX PO) Take 17 g by mouth as needed.      Triamcinolone Acetonide (NASACORT ALLERGY 24HR NA) Place 1 spray into the nose as needed.      venlafaxine XR (EFFEXOR-XR) 75 MG 24 hr capsule Take 1 capsule (75 mg total) by mouth daily. 90 capsule 1   Zoster Vaccine Adjuvanted (SHINGRIX) injection TO BE ADMINISTERED BY PHARMACIST 1 each 1   ibuprofen (ADVIL,MOTRIN) 200 MG tablet Take 600 mg by mouth every 8 (eight) hours as  needed.     No facility-administered medications prior to visit.     Per HPI unless specifically indicated in ROS section below Review of Systems  Constitutional:  Negative for fatigue and fever.  HENT:  Negative for congestion.   Eyes:  Negative for pain.  Respiratory:  Negative for cough and shortness of breath.   Cardiovascular:  Negative for chest pain, palpitations and leg swelling.  Gastrointestinal:  Negative for abdominal pain.  Genitourinary:  Negative for dysuria and vaginal bleeding.       Occ incontinence, stress  No blood.  Musculoskeletal:  Negative for back pain.  Neurological:  Negative for syncope,  light-headedness and headaches.  Psychiatric/Behavioral:  Negative for dysphoric mood.   Objective:  Pulse 84   Temp 98 F (36.7 C) (Temporal)   Ht 5\' 7"  (1.702 m)   Wt 165 lb 4 oz (75 kg)   SpO2 99%   BMI 25.88 kg/m   Wt Readings from Last 3 Encounters:  05/23/21 165 lb 4 oz (75 kg)  03/20/21 166 lb 6.4 oz (75.5 kg)  01/17/21 166 lb (75.3 kg)      Physical Exam Exam conducted with a chaperone present.  Constitutional:      General: She is not in acute distress.    Appearance: Normal appearance. She is well-developed. She is not ill-appearing or toxic-appearing.  HENT:     Head: Normocephalic.     Right Ear: Hearing, tympanic membrane, ear canal and external ear normal.     Left Ear: Hearing, tympanic membrane, ear canal and external ear normal.     Nose: Nose normal.  Eyes:     General: Lids are normal. Lids are everted, no foreign bodies appreciated.     Conjunctiva/sclera: Conjunctivae normal.     Pupils: Pupils are equal, round, and reactive to light.  Neck:     Thyroid: No thyroid mass or thyromegaly.     Vascular: No carotid bruit.     Trachea: Trachea normal.  Cardiovascular:     Rate and Rhythm: Normal rate and regular rhythm.     Heart sounds: Normal heart sounds, S1 normal and S2 normal. No murmur heard.   No gallop.  Pulmonary:     Effort: Pulmonary effort is normal. No respiratory distress.     Breath sounds: Normal breath sounds. No wheezing, rhonchi or rales.  Abdominal:     General: Bowel sounds are normal. There is no distension or abdominal bruit.     Palpations: Abdomen is soft. There is no fluid wave or mass.     Tenderness: There is no abdominal tenderness. There is no guarding or rebound.     Hernia: No hernia is present.  Genitourinary:    Exam position: Supine.     Labia:        Right: No rash, tenderness or lesion.        Left: No rash, tenderness or lesion.      Vagina: Normal.     Uterus: Absent.      Adnexa:        Right: No mass,  tenderness or fullness.         Left: No mass, tenderness or fullness.       Comments:  Pap smear of vaginal cuff Musculoskeletal:     Cervical back: Normal range of motion and neck supple.  Lymphadenopathy:     Cervical: No cervical adenopathy.  Skin:    General: Skin is warm and dry.  Findings: No rash.  Neurological:     Mental Status: She is alert.     Cranial Nerves: No cranial nerve deficit.     Sensory: No sensory deficit.  Psychiatric:        Mood and Affect: Mood is not anxious or depressed.        Speech: Speech normal.        Behavior: Behavior normal. Behavior is cooperative.        Judgment: Judgment normal.      Results for orders placed or performed in visit on 10/05/20  [facility] MAMMOGRAPHY  Result Value Ref Range   [facility] Mammogram 0-4 Bi-Rad 0-4 Bi-Rad, Self Reported Normal    This visit occurred during the SARS-CoV-2 public health emergency.  Safety protocols were in place, including screening questions prior to the visit, additional usage of staff PPE, and extensive cleaning of exam room while observing appropriate contact time as indicated for disinfecting solutions.   COVID 19 screen:  No recent travel or known exposure to COVID19 The patient denies respiratory symptoms of COVID 19 at this time. The importance of social distancing was discussed today.   Assessment and Plan The patient's preventative maintenance and recommended screening tests for an annual wellness exam were reviewed in full today. Brought up to date unless services declined.  Counselled on the importance of diet, exercise, and its role in overall health and mortality. The patient's FH and SH was reviewed, including their home life, tobacco status, and drug and alcohol status.  Vaccines:uptodate with PNA,  shingrix, tdap  and flu.Nonsmoker   S/P COVID series x 5 Mammo: nml in 09/2020, repeat q 1 years given endometrial cancer DVE/PAP:  Pap 01/2019... GYN recs every 2 years or so.. will do this  year and plan to reconsider. Colon: Colonoscopy  07/2015, hx of polyps, repeat in 5 years. Due! 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 SE of constipation. Stopped  Fosamax after surgery.  Repeat  DXA 12/2015 NORMAL on no med. Repeat in 2022.. DUE  HCV neg in 2014.      Eliezer Lofts, MD

## 2021-05-23 NOTE — Addendum Note (Signed)
Addended by: Carter Kitten on: 05/23/2021 10:46 AM   Modules accepted: Orders

## 2021-05-23 NOTE — Assessment & Plan Note (Signed)
Due for re-eval. 

## 2021-05-23 NOTE — Assessment & Plan Note (Signed)
Followed by Cardiology 

## 2021-05-23 NOTE — Assessment & Plan Note (Signed)
Stable, chronic.  Continue current medication.  Venlafaxine xl 75 mg daily.

## 2021-05-24 ENCOUNTER — Other Ambulatory Visit (HOSPITAL_COMMUNITY): Payer: Self-pay

## 2021-05-25 LAB — CYTOLOGY - PAP
Comment: NEGATIVE
Diagnosis: NEGATIVE
High risk HPV: NEGATIVE

## 2021-06-13 ENCOUNTER — Other Ambulatory Visit (HOSPITAL_COMMUNITY): Payer: Self-pay

## 2021-06-13 ENCOUNTER — Other Ambulatory Visit: Payer: Self-pay | Admitting: Family Medicine

## 2021-06-13 MED ORDER — ATORVASTATIN CALCIUM 10 MG PO TABS
10.0000 mg | ORAL_TABLET | Freq: Every day | ORAL | 3 refills | Status: DC
  Filled 2021-06-13: qty 90, 90d supply, fill #0
  Filled 2021-09-12: qty 90, 90d supply, fill #1
  Filled 2021-12-12: qty 90, 90d supply, fill #2
  Filled 2022-03-12: qty 90, 90d supply, fill #3

## 2021-07-12 ENCOUNTER — Other Ambulatory Visit (HOSPITAL_COMMUNITY): Payer: Self-pay

## 2021-07-17 ENCOUNTER — Ambulatory Visit: Payer: PPO | Admitting: Cardiology

## 2021-07-24 ENCOUNTER — Other Ambulatory Visit (HOSPITAL_COMMUNITY): Payer: Self-pay

## 2021-07-24 ENCOUNTER — Other Ambulatory Visit: Payer: Self-pay | Admitting: Cardiology

## 2021-07-24 MED ORDER — LOSARTAN POTASSIUM-HCTZ 100-25 MG PO TABS
1.0000 | ORAL_TABLET | Freq: Every day | ORAL | 2 refills | Status: DC
  Filled 2021-07-24: qty 90, 90d supply, fill #0
  Filled 2021-10-23: qty 90, 90d supply, fill #1
  Filled 2022-01-19: qty 90, 90d supply, fill #2

## 2021-08-17 DIAGNOSIS — Z9841 Cataract extraction status, right eye: Secondary | ICD-10-CM | POA: Diagnosis not present

## 2021-08-17 DIAGNOSIS — H17812 Minor opacity of cornea, left eye: Secondary | ICD-10-CM | POA: Diagnosis not present

## 2021-08-17 DIAGNOSIS — H26493 Other secondary cataract, bilateral: Secondary | ICD-10-CM | POA: Diagnosis not present

## 2021-08-17 DIAGNOSIS — Z9842 Cataract extraction status, left eye: Secondary | ICD-10-CM | POA: Diagnosis not present

## 2021-08-17 DIAGNOSIS — H52223 Regular astigmatism, bilateral: Secondary | ICD-10-CM | POA: Diagnosis not present

## 2021-08-22 ENCOUNTER — Other Ambulatory Visit: Payer: Self-pay | Admitting: Family Medicine

## 2021-08-22 ENCOUNTER — Other Ambulatory Visit (HOSPITAL_COMMUNITY): Payer: Self-pay

## 2021-08-22 MED ORDER — VENLAFAXINE HCL ER 75 MG PO CP24
75.0000 mg | ORAL_CAPSULE | Freq: Every day | ORAL | 1 refills | Status: DC
  Filled 2021-08-22: qty 90, 90d supply, fill #0
  Filled 2021-11-22: qty 90, 90d supply, fill #1

## 2021-09-12 ENCOUNTER — Other Ambulatory Visit (HOSPITAL_COMMUNITY): Payer: Self-pay

## 2021-10-02 ENCOUNTER — Encounter: Payer: Self-pay | Admitting: Family Medicine

## 2021-10-05 DIAGNOSIS — Z780 Asymptomatic menopausal state: Secondary | ICD-10-CM | POA: Diagnosis not present

## 2021-10-05 DIAGNOSIS — Z1231 Encounter for screening mammogram for malignant neoplasm of breast: Secondary | ICD-10-CM | POA: Diagnosis not present

## 2021-10-05 LAB — HM MAMMOGRAPHY

## 2021-10-05 LAB — HM DEXA SCAN: HM Dexa Scan: NORMAL

## 2021-10-06 ENCOUNTER — Encounter: Payer: Self-pay | Admitting: Family Medicine

## 2021-10-10 ENCOUNTER — Ambulatory Visit: Payer: PPO | Admitting: Cardiology

## 2021-10-10 ENCOUNTER — Other Ambulatory Visit: Payer: Self-pay

## 2021-10-10 ENCOUNTER — Encounter: Payer: Self-pay | Admitting: Cardiology

## 2021-10-10 VITALS — BP 118/72 | HR 77 | Ht 67.0 in | Wt 168.6 lb

## 2021-10-10 DIAGNOSIS — I493 Ventricular premature depolarization: Secondary | ICD-10-CM | POA: Diagnosis not present

## 2021-10-10 DIAGNOSIS — I471 Supraventricular tachycardia: Secondary | ICD-10-CM

## 2021-10-10 DIAGNOSIS — I10 Essential (primary) hypertension: Secondary | ICD-10-CM

## 2021-10-10 NOTE — Progress Notes (Signed)
Cardiology office Note    Date:  10/10/2021   ID:  [name], [name] [birth year], MRN [id number]  PCP:  Jinny Sanders, MD  Cardiologist:  Fransico Him, MD   Chief Complaint  Patient presents with   Follow-up    PVCs, nonsustained atrial tachycardia and hypertension     History of Present Illness:  Christina Griffith is a 71 y.o. female  with a hx of asthma, HTN, Palpitations with PVCs and HTN. 2D echo showed low normal LVF with EF 50-55% with G1DD, mildly enlarged RA and LA, mild MR.  Lexiscan myoview done showing no ischemia. She was started on Toprol XL '25mg'$  daily for suppression of PVCs.  Cardiac MRI showed normal LVF with EF 56% with no LGE, normal RV, mild MR and TR.  2 week ziopatch showed frequent PVCs, trigeminal PVCs and nonsustained wide complex tachycardia up to 5 beats in a row with PVC load 18%.  SHe also had nonsustained atrial tachycardia up to 16 beats in a row as fast ad 235bpm. Her Toprol XL was increased to '50mg'$  daily and she was referred to EP.  AADT with Flecainide was discussed but her palpitations had improved with Toprol and medical therapy was continued.    She was seen last summer due to increased frequency of palpitations and her Toprol was increased to 75 mg twice daily.  She then was seen back in August and she was doing well with no palpitations.  She is here today for followup and is doing well.  She denies any chest pain or pressure, SOB, DOE, PND, orthopnea, LE edema, dizziness, palpitations or syncope. She is compliant with her meds and is tolerating meds with no SE.      Past Medical History:  Diagnosis Date   Arthritis    spinal stenosis--back pain--hx of herniated cervical disk- but no surgery   Asthma    Cold 10/03/12   pt getting over a cold--still has some head congestion, slight cough   Colon polyps    Endometrial ca [facility])    H/O: hematuria    benign essential hematuria - per urology work up    History of depression    History of  pneumonia    Hypertension    Numbness in right leg    pt. states occassional in right leg and right big toe from disc herniations   PAT (paroxysmal atrial tachycardia) ([place])    noted on heart monitor 07/2020   PONV (postoperative nausea and vomiting)    with prior hip replacement   PVC's (premature ventricular contractions)    PVCs, trigeminal PVCs, WCT nonsustained with PVC load 18% by montior 07/2020   Stress incontinence    Wears glasses     Past Surgical History:  Procedure Laterality Date   BREAST SURGERY Right 1980's   breast biopsy   COLONOSCOPY     DENTAL SURGERY  11/2019   EYE SURGERY Bilateral 02/2015   cataracts   HYSTEROSCOPY WITH D & C Left 1/24/204   w/ resection of Endometrial polyps   ROBOTIC ASSISTED TOTAL HYSTERECTOMY WITH BILATERAL SALPINGO OOPHERECTOMY N/A 10/07/2012   Procedure: ROBOTIC ASSISTED TOTAL HYSTERECTOMY WITH BILATERAL SALPINGO OOPHORECTOMY/POSSIBLE LYMPH NODE BIOPSY;  Surgeon: Imagene Gurney A. Alycia Rossetti, MD;  Location: [facility];  Service: Gynecology;  Laterality: N/A;   TOTAL HIP ARTHROPLASTY     right 2002 left 2004   TOTAL HIP REVISION Right 05/09/2015   Procedure: TOTAL HIP REVISION;  Surgeon: Frederik Pear, MD;  Location:  [facility];  Service: Orthopedics;  Laterality: Right;    Current Medications: Current Meds  Medication Sig   albuterol (PROVENTIL HFA;VENTOLIN HFA) 108 (90 Base) MCG/ACT inhaler Inhale 2 puffs into the lungs every 6 (six) hours as needed for wheezing or shortness of breath.   atorvastatin (LIPITOR) 10 MG tablet Take 1 tablet (10 mg total) by mouth daily.   Calcium Carb-Cholecalciferol 600-800 MG-UNIT TABS Take 1 tablet by mouth daily.   fexofenadine (ALLEGRA) 180 MG tablet Take 180 mg by mouth daily as needed for allergies or rhinitis.   losartan-hydrochlorothiazide (HYZAAR) 100-25 MG tablet TAKE 1 TABLET BY MOUTH DAILY.   magnesium gluconate (MAGONATE) 500 MG tablet Take 500 mg by mouth daily.   metoprolol succinate (TOPROL-XL) 50 MG 24 hr  tablet Take 1.5 tablets (75 mg total) by mouth in the morning and at bedtime. Take with or immediately following a meal.   Multiple Vitamin (MULTIVITAMIN) tablet Take 1 tablet by mouth daily.   PFIZER-BIONT COVID-19 VAC-TRIS SUSP injection    Polyethylene Glycol 3350 (MIRALAX PO) Take 17 g by mouth as needed.    Triamcinolone Acetonide (NASACORT ALLERGY 24HR NA) Place 1 spray into the nose as needed.    venlafaxine XR (EFFEXOR-XR) 75 MG 24 hr capsule Take 1 capsule (75 mg total) by mouth daily.   Zoster Vaccine Adjuvanted [facility]) injection TO BE ADMINISTERED BY PHARMACIST    Allergies:   Patient has no known allergies.   Social History   Socioeconomic History   Marital status: Single    Spouse name: Not on file   Number of children: Not on file   Years of education: Not on file   Highest education level: Not on file  Occupational History   Occupation: Engineer, mining    Employer: [employer]  Tobacco Use   Smoking status: Never   Smokeless tobacco: Never  Vaping Use   Vaping Use: Never used  Substance and Sexual Activity   Alcohol use: Yes    Alcohol/week: 3.0 standard drinks    Types: 3 Glasses of wine per week    Comment: 3 x a week   Drug use: No   Sexual activity: Not on file  Other Topics Concern   Not on file  Social History Narrative   Regular exercise- yes, 5-6 days a week, yoga   Diet: healthy   Social Determinants of Health   Financial Resource Strain: Low Risk    Difficulty of Paying Living Expenses: Not hard at all  Food Insecurity: No Food Insecurity   Worried About Charity fundraiser in the Last Year: Never true   Ran Out of Food in the Last Year: Never true  Transportation Needs: No Transportation Needs   Lack of Transportation (Medical): No   Lack of Transportation (Non-Medical): No  Physical Activity: Sufficiently Active   Days of Exercise per Week: 7 days   Minutes of Exercise per Session: 60 min  Stress: No Stress Concern Present    Feeling of Stress : Only a little  Social Connections: Moderately Isolated   Frequency of Communication with Friends and Family: Once a week   Frequency of Social Gatherings with Friends and Family: Once a week   Attends Religious Services: More than 4 times per year   Active Member of [employer] or Organizations: Yes   Attends Music therapist: More than 4 times per year   Marital Status: Never married     Family History:  The patient's family  history includes Breast cancer in her cousin; Cancer in her father and maternal grandmother; Hyperlipidemia in her father; Hypertension in her mother.   ROS:   Please see the history of present illness.    ROS All other systems reviewed and are negative.  No flowsheet data found.     PHYSICAL EXAM:   VS:  BP 118/72    Pulse 77    Ht '5\' 7"'$  (1.702 m)    Wt 168 lb 9.6 oz (76.5 kg)    SpO2 99%    BMI 26.41 kg/m     GEN: Well nourished, well developed in no acute distress HEENT: Normal NECK: No JVD; No carotid bruits LYMPHATICS: No lymphadenopathy CARDIAC:RRR, no murmurs, rubs, gallops RESPIRATORY:  Clear to auscultation without rales, wheezing or rhonchi  ABDOMEN: Soft, non-tender, non-distended MUSCULOSKELETAL:  No edema; No deformity  SKIN: Warm and dry NEUROLOGIC:  Alert and oriented x 3 PSYCHIATRIC:  Normal affect   Wt Readings from Last 3 Encounters:  10/10/21 168 lb 9.6 oz (76.5 kg)  05/23/21 165 lb 4 oz (75 kg)  03/20/21 166 lb 6.4 oz (75.5 kg)      Studies/Labs Reviewed:   2D echo 21-Jun-2020 IMPRESSIONS    1. Left ventricular ejection fraction, by estimation, is 50 to 55%. The  left ventricle has low normal function. The left ventricle has no regional  wall motion abnormalities. Left ventricular diastolic parameters are  consistent with Grade I diastolic  dysfunction (impaired relaxation).   2. Right ventricular systolic function is normal. The right ventricular  size is mildly enlarged.   3. Left atrial size  was mildly dilated.   4. Right atrial size was mildly dilated.   5. The mitral valve is normal in structure. Mild mitral valve  regurgitation. No evidence of mitral stenosis.   6. The aortic valve is normal in structure. Aortic valve regurgitation is  not visualized. No aortic stenosis is present.   7. The inferior vena cava is normal in size with greater than 50%  respiratory variability, suggesting right atrial pressure of 3 mmHg.   Cardiac MRI 07/2020 IMPRESSION: 1. Normal left ventricular size, thickness and systolic function (LVEF = 56%). There are no regional wall motion abnormalities and no late gadolinium enhancement in the left ventricular myocardium. Normal extracellular volume (ECV): 29%.   2. Normal right ventricular size, thickness and systolic function (RVEF = 48%). There are no regional wall motion abnormalities.   3.  Normal left and right atrial size.   4. Normal size of the aortic root, ascending aorta and pulmonary artery.   5.  Mild mitral and trivial tricuspid regurgitation.   There is no evidence for infiltrative or inflammatory cardiomyopathy or arrhythmogenic right ventricular hypertrophy.  EKG:  EKG is was ordered today and demonstrates NSR with PVCs and low voltage QRS  Recent Labs: 05/23/2021: ALT 38; BUN 18; Creatinine, Ser 0.77; Potassium 3.8; Sodium 138   Lipid Panel    Component Value Date/Time   CHOL 171 05/23/2021 1055   TRIG 186.0 (H) 05/23/2021 1055   HDL 55.90 05/23/2021 1055   CHOLHDL 3 05/23/2021 1055   VLDL 37.2 05/23/2021 1055   LDLCALC 78 05/23/2021 1055      Additional studies/ records that were reviewed today include:  OV notes from PCP, EKG  2D echo   ASSESSMENT:    1. PVC's (premature ventricular contractions)   2. PAT (paroxysmal atrial tachycardia) ([place])   3. Primary hypertension      PLAN:  In order of problems listed above:  Palpitations/PVCs -2 week ziopatch  showed frequent PVCs with PVC load 18% with  NSVT and trigeminal PVCs -seen by EP and discussed AADT vs.PVC ablation but her palpitations had significantly improved on higher dose of BB -cardiac MRI showed normal LVF and RVF with no LGE  -TSH and Hbg were normal  -Lexiscan myoview with no ischemia -She is now on Toprol XL 75 mg twice daily with resolution of palpitations  -Continue prescription drug management with Toprol-XL 75 mg twice daily with as needed refills   2.  Paroxysmal atrial tachycardia -palpitations have significantly improved on Toprol XL 75 mg twice daily -continue Toprol  3.  HTN -BP is adequately controlled on exam today -Continue prescription drug management with Toprol-XL 75 mg twice daily and Hyzaar 100-25 mg daily with as needed refills -I have personally reviewed and interpreted outside labs performed by patient's PCP which showed serum creatinine 0.77 and potassium 3.8 on 05/13/2021   Medication Adjustments/Labs and Tests Ordered: Current medicines are reviewed at length with the patient today.  Concerns regarding medicines are outlined above.  Medication changes, Labs and Tests ordered today are listed in the Patient Instructions below.  There are no Patient Instructions on file for this visit.   Signed, Fransico Him, MD  10/10/2021 9:07 AM    [facility] [place], [place], [place]  [address] Phone: [phone]; Fax: [phone]

## 2021-10-10 NOTE — Patient Instructions (Signed)
Medication Instructions:  °Your physician recommends that you continue on your current medications as directed. Please refer to the Current Medication list given to you today. ° °*If you need a refill on your cardiac medications before your next appointment, please call your pharmacy* ° ° °Lab Work: °None ordered ° °If you have labs (blood work) drawn today and your tests are completely normal, you will receive your results only by: °MyChart Message (if you have MyChart) OR °A paper copy in the mail °If you have any lab test that is abnormal or we need to change your treatment, we will call you to review the results. ° ° °Testing/Procedures: °None ordered ° ° °Follow-Up: °At [facility], you and your health needs are our priority.  As part of our continuing mission to provide you with exceptional heart care, we have created designated Provider Care Teams.  These Care Teams include your primary Cardiologist (physician) and Advanced Practice Providers (APPs -  Physician Assistants and Nurse Practitioners) who all work together to provide you with the care you need, when you need it. ° °We recommend signing up for the patient portal called "MyChart".  Sign up information is provided on this After Visit Summary.  MyChart is used to connect with patients for Virtual Visits (Telemedicine).  Patients are able to view lab/test results, encounter notes, upcoming appointments, etc.  Non-urgent messages can be sent to your provider as well.   °To learn more about what you can do with MyChart, go to [email or web address].   ° °Your next appointment:   °12 month(s) ° °The format for your next appointment:   °In Person ° °Provider:   °Traci Turner, MD   ° ° °Other Instructions °  °

## 2021-10-13 ENCOUNTER — Other Ambulatory Visit (HOSPITAL_COMMUNITY): Payer: Self-pay

## 2021-10-13 DIAGNOSIS — H26493 Other secondary cataract, bilateral: Secondary | ICD-10-CM | POA: Diagnosis not present

## 2021-10-13 DIAGNOSIS — H35371 Puckering of macula, right eye: Secondary | ICD-10-CM | POA: Diagnosis not present

## 2021-10-13 DIAGNOSIS — H18413 Arcus senilis, bilateral: Secondary | ICD-10-CM | POA: Diagnosis not present

## 2021-10-13 DIAGNOSIS — Z961 Presence of intraocular lens: Secondary | ICD-10-CM | POA: Diagnosis not present

## 2021-10-13 DIAGNOSIS — H26491 Other secondary cataract, right eye: Secondary | ICD-10-CM | POA: Diagnosis not present

## 2021-10-13 MED ORDER — PREDNISOLONE ACETATE 1 % OP SUSP
1.0000 [drp] | Freq: Four times a day (QID) | OPHTHALMIC | 0 refills | Status: DC
  Filled 2021-10-13: qty 5, 25d supply, fill #0

## 2021-10-23 ENCOUNTER — Other Ambulatory Visit (HOSPITAL_COMMUNITY): Payer: Self-pay

## 2021-10-26 DIAGNOSIS — Z961 Presence of intraocular lens: Secondary | ICD-10-CM | POA: Diagnosis not present

## 2021-10-26 DIAGNOSIS — H26492 Other secondary cataract, left eye: Secondary | ICD-10-CM | POA: Diagnosis not present

## 2021-11-13 ENCOUNTER — Other Ambulatory Visit (HOSPITAL_COMMUNITY): Payer: Self-pay

## 2021-11-13 ENCOUNTER — Other Ambulatory Visit: Payer: Self-pay | Admitting: Cardiology

## 2021-11-13 MED ORDER — METOPROLOL SUCCINATE ER 50 MG PO TB24
75.0000 mg | ORAL_TABLET | Freq: Two times a day (BID) | ORAL | 3 refills | Status: DC
  Filled 2021-11-13: qty 270, 90d supply, fill #0
  Filled 2022-02-13: qty 270, 90d supply, fill #1
  Filled 2022-05-30: qty 270, 90d supply, fill #2
  Filled 2022-09-03: qty 270, 90d supply, fill #3

## 2021-11-22 ENCOUNTER — Other Ambulatory Visit (HOSPITAL_COMMUNITY): Payer: Self-pay

## 2021-12-12 ENCOUNTER — Other Ambulatory Visit (HOSPITAL_COMMUNITY): Payer: Self-pay

## 2021-12-26 ENCOUNTER — Ambulatory Visit: Payer: PPO | Admitting: Cardiology

## 2021-12-26 ENCOUNTER — Encounter: Payer: Self-pay | Admitting: Cardiology

## 2021-12-26 VITALS — BP 112/66 | HR 80 | Ht 67.0 in | Wt 169.2 lb

## 2021-12-26 DIAGNOSIS — I10 Essential (primary) hypertension: Secondary | ICD-10-CM | POA: Diagnosis not present

## 2021-12-26 DIAGNOSIS — I493 Ventricular premature depolarization: Secondary | ICD-10-CM | POA: Diagnosis not present

## 2021-12-26 NOTE — Progress Notes (Signed)
Electrophysiology Office Follow up Visit Note:    Date:  12/26/2021   ID:  Christina Griffith, DOB 03/31/1951, MRN [id number]  PCP:  Jinny Sanders, MD  [facility] Cardiologist:  Fransico Him, MD  [facility] Electrophysiologist:  Vickie Epley, MD    Interval History:    Christina Griffith is a 71 y.o. female who presents for a follow up visit. They were last seen in clinic 10/04/2020.  Since their last appointment, they most recently followed up with Dr. Radford Pax on 10/10/2021 where they were doing well with no recurrent palpitations.  Overall, she feels like she is gradually getting better. However, she continues to suffer from general fatigue daily. She plans her day around taking breaks between activities. Also she is affected by the heat outside. While walking the dogs she will become fatigued and short of breath, needing to sit down.   Her last known episode of palpitations occurred after she gave blood last August.  She denies any chest pain, or peripheral edema. No lightheadedness, headaches, syncope, orthopnea, or PND.      Past Medical History:  Diagnosis Date   Arthritis    spinal stenosis--back pain--hx of herniated cervical disk- but no surgery   Asthma    Cold 10/03/12   pt getting over a cold--still has some head congestion, slight cough   Colon polyps    Endometrial ca [facility])    H/O: hematuria    benign essential hematuria - per urology work up    History of depression    History of pneumonia    Hypertension    Numbness in right leg    pt. states occassional in right leg and right big toe from disc herniations   PAT (paroxysmal atrial tachycardia) ([place])    noted on heart monitor 07/2020   PONV (postoperative nausea and vomiting)    with prior hip replacement   PVC's (premature ventricular contractions)    PVCs, trigeminal PVCs, WCT nonsustained with PVC load 18% by montior 07/2020   Stress incontinence    Wears glasses     Past Surgical  History:  Procedure Laterality Date   BREAST SURGERY Right 1980's   breast biopsy   COLONOSCOPY     DENTAL SURGERY  11/2019   EYE SURGERY Bilateral 02/2015   cataracts   HYSTEROSCOPY WITH D & C Left 1/24/204   w/ resection of Endometrial polyps   ROBOTIC ASSISTED TOTAL HYSTERECTOMY WITH BILATERAL SALPINGO OOPHERECTOMY N/A 10/07/2012   Procedure: ROBOTIC ASSISTED TOTAL HYSTERECTOMY WITH BILATERAL SALPINGO OOPHORECTOMY/POSSIBLE LYMPH NODE BIOPSY;  Surgeon: Imagene Gurney A. Alycia Rossetti, MD;  Location: [facility];  Service: Gynecology;  Laterality: N/A;   TOTAL HIP ARTHROPLASTY     right 2002 left 2004   TOTAL HIP REVISION Right 05/09/2015   Procedure: TOTAL HIP REVISION;  Surgeon: Frederik Pear, MD;  Location: [place];  Service: Orthopedics;  Laterality: Right;    Current Medications: Current Meds  Medication Sig   albuterol (PROVENTIL HFA;VENTOLIN HFA) 108 (90 Base) MCG/ACT inhaler Inhale 2 puffs into the lungs every 6 (six) hours as needed for wheezing or shortness of breath.   atorvastatin (LIPITOR) 10 MG tablet Take 1 tablet (10 mg total) by mouth daily.   Calcium Carb-Cholecalciferol 600-800 MG-UNIT TABS Take 1 tablet by mouth daily.   fexofenadine (ALLEGRA) 180 MG tablet Take 180 mg by mouth daily as needed for allergies or rhinitis.   losartan-hydrochlorothiazide (HYZAAR) 100-25 MG tablet TAKE 1 TABLET BY MOUTH DAILY.   magnesium  gluconate (MAGONATE) 500 MG tablet Take 500 mg by mouth daily.   metoprolol succinate (TOPROL-XL) 50 MG 24 hr tablet Take 1.5 tablets (75 mg total) by mouth in the morning and at bedtime. Take with or immediately following a meal.   Multiple Vitamin (MULTIVITAMIN) tablet Take 1 tablet by mouth daily.   PFIZER-BIONT COVID-19 VAC-TRIS SUSP injection    Polyethylene Glycol 3350 (MIRALAX PO) Take 17 g by mouth as needed.    Triamcinolone Acetonide (NASACORT ALLERGY 24HR NA) Place 1 spray into the nose as needed.    venlafaxine XR (EFFEXOR-XR) 75 MG 24 hr capsule Take 1 capsule (75 mg  total) by mouth daily.     Allergies:   Patient has no known allergies.   Social History   Socioeconomic History   Marital status: Single    Spouse name: Not on file   Number of children: Not on file   Years of education: Not on file   Highest education level: Not on file  Occupational History   Occupation: Engineer, mining    Employer: [employer]  Tobacco Use   Smoking status: Never   Smokeless tobacco: Never  Vaping Use   Vaping Use: Never used  Substance and Sexual Activity   Alcohol use: Yes    Alcohol/week: 3.0 standard drinks    Types: 3 Glasses of wine per week    Comment: 3 x a week   Drug use: No   Sexual activity: Not on file  Other Topics Concern   Not on file  Social History Narrative   Regular exercise- yes, 5-6 days a week, yoga   Diet: healthy   Social Determinants of Health   Financial Resource Strain: Low Risk    Difficulty of Paying Living Expenses: Not hard at all  Food Insecurity: No Food Insecurity   Worried About Charity fundraiser in the Last Year: Never true   Ran Out of Food in the Last Year: Never true  Transportation Needs: No Transportation Needs   Lack of Transportation (Medical): No   Lack of Transportation (Non-Medical): No  Physical Activity: Sufficiently Active   Days of Exercise per Week: 7 days   Minutes of Exercise per Session: 60 min  Stress: No Stress Concern Present   Feeling of Stress : Only a little  Social Connections: Moderately Isolated   Frequency of Communication with Friends and Family: Once a week   Frequency of Social Gatherings with Friends and Family: Once a week   Attends Religious Services: More than 4 times per year   Active Member of [employer] or Organizations: Yes   Attends Music therapist: More than 4 times per year   Marital Status: Never married     Family History: The patient's family history includes Breast cancer in her cousin; Cancer in her father and maternal grandmother;  Hyperlipidemia in her father; Hypertension in her mother.  ROS:   Please see the history of present illness.    (+) Fatigue (+) Exertional shortness of breath All other systems reviewed and are negative.  EKGs/Labs/Other Studies Reviewed:    The following studies were reviewed today:  02/2021  Monitor Normal sinus rhythm is the predominant rhythm There are runs of SVT with the longest being 12.9 seconds.   No other sustained arrhythmias.   Patient triggered events were sinus rhythm or during atrial ectopy.  07/13/2020  Cardiac MRI FINDINGS: 1. Normal left ventricular size, thickness and systolic function (LVEF = 56%). There are  no regional wall motion abnormalities.   There is no late gadolinium enhancement in the left ventricular myocardium. Extracellular volume (ECV): 29%.   LVEDD: 47 mm   LVESD: 36 mm   LVEDV: 117 ml   LVESV: 51 ml   SV: 66 ml   CO: 5.2 L/min   Myocardial mass: 76 g   2. Normal right ventricular size, thickness and systolic function (RVEF = 48%). There are no regional wall motion abnormalities.   RVEDV: 106 ml   RVESV: 55 ml   3.  Normal left and right atrial size.   4. Normal size of the aortic root, ascending aorta and pulmonary artery.   5.  Mild mitral and trivial tricuspid regurgitation.   6.  Normal pericardium.  No pericardial effusion.   IMPRESSION: 1. Normal left ventricular size, thickness and systolic function (LVEF = 56%). There are no regional wall motion abnormalities and no late gadolinium enhancement in the left ventricular myocardium. Normal extracellular volume (ECV): 29%.   2. Normal right ventricular size, thickness and systolic function (RVEF = 48%). There are no regional wall motion abnormalities.   3.  Normal left and right atrial size.   4. Normal size of the aortic root, ascending aorta and pulmonary artery.   5.  Mild mitral and trivial tricuspid regurgitation.   There is no evidence for infiltrative  or inflammatory cardiomyopathy or arrhythmogenic right ventricular hypertrophy.  06/24/2020  Lexiscan Myoview The left ventricular ejection fraction is normal (55-65%). Nuclear stress EF: 60%. There was no ST segment deviation noted during stress. The study is normal. This is a low risk study.   Low risk stress nuclear study with normal perfusion and normal left ventricular regional and global systolic function.  06/10/2020  Echo Sonographer Comments: Suboptimal apical window.  IMPRESSIONS    1. Left ventricular ejection fraction, by estimation, is 50 to 55%. The  left ventricle has low normal function. The left ventricle has no regional  wall motion abnormalities. Left ventricular diastolic parameters are  consistent with Grade I diastolic  dysfunction (impaired relaxation).   2. Right ventricular systolic function is normal. The right ventricular  size is mildly enlarged.   3. Left atrial size was mildly dilated.   4. Right atrial size was mildly dilated.   5. The mitral valve is normal in structure. Mild mitral valve  regurgitation. No evidence of mitral stenosis.   6. The aortic valve is normal in structure. Aortic valve regurgitation is  not visualized. No aortic stenosis is present.   7. The inferior vena cava is normal in size with greater than 50%  respiratory variability, suggesting right atrial pressure of 3 mmHg.     EKG:  EKG is personally reviewed.  12/26/2021: Sinus rhythm.  No PVCs. 10/04/2020:  sinus rhythm.  Rhythm strip without PVCs.  Recent Labs: 05/23/2021: ALT 38; BUN 18; Creatinine, Ser 0.77; Potassium 3.8; Sodium 138   Recent Lipid Panel    Component Value Date/Time   CHOL 171 05/23/2021 1055   TRIG 186.0 (H) 05/23/2021 1055   HDL 55.90 05/23/2021 1055   CHOLHDL 3 05/23/2021 1055   VLDL 37.2 05/23/2021 1055   LDLCALC 78 05/23/2021 1055    Physical Exam:    VS:  BP 112/66   Pulse 80   Ht '5\' 7"'$  (1.702 m)   Wt 169 lb 3.2 oz (76.7 kg)   BMI  26.50 kg/m     Wt Readings from Last 3 Encounters:  12/26/21 169 lb 3.2 oz (  76.7 kg)  10/10/21 168 lb 9.6 oz (76.5 kg)  05/23/21 165 lb 4 oz (75 kg)     GEN: Well nourished, well developed in no acute distress HEENT: Normal NECK: No JVD; No carotid bruits LYMPHATICS: No lymphadenopathy CARDIAC: RRR, no murmurs, rubs, gallops RESPIRATORY:  Clear to auscultation without rales, wheezing or rhonchi  ABDOMEN: Soft, non-tender, non-distended MUSCULOSKELETAL:  No edema; No deformity  SKIN: Warm and dry NEUROLOGIC:  Alert and oriented x 3 PSYCHIATRIC:  Normal affect        ASSESSMENT:    1. PVC's (premature ventricular contractions)   2. Primary hypertension    PLAN:    In order of problems listed above:  #PVCs #SVT Doing well on metoprolol 75 mg by mouth twice daily.  She is having some side effects of fatigue while on this medicine.  I recommended that she slowly down titrate the dose starting with the a.m. dose to 50 mg.  The ultimate goal would be to get down to 25 mg twice daily.  She will do this slowly and monitor for increased PVCs.  #Hypertension Controlled Continue current medical therapy.  Recommend checking blood pressures 1-2 times per week and recording these values.  She should bring these recordings to her primary care physician.  We will plan to see her back in 1 year or sooner as needed.  APP appointment okay.    Follow-up in 1 year.   Message sent to primary care and Dr. Radford Pax.  Medication Adjustments/Labs and Tests Ordered: Current medicines are reviewed at length with the patient today.  Concerns regarding medicines are outlined above.  No orders of the defined types were placed in this encounter.  No orders of the defined types were placed in this encounter.   I,Mathew Stumpf,acting as a Education administrator for Vickie Epley, MD.,have documented all relevant documentation on the behalf of Vickie Epley, MD,as directed by  Vickie Epley, MD while  in the presence of Vickie Epley, MD.  I, Vickie Epley, MD, have reviewed all documentation for this visit. The documentation on 12/26/21 for the exam, diagnosis, procedures, and orders are all accurate and complete.   Signed, Lars Mage, MD, [facility], [facility] 12/26/2021 8:55 AM    Electrophysiology [facility]

## 2021-12-26 NOTE — Patient Instructions (Signed)

## 2022-01-19 ENCOUNTER — Other Ambulatory Visit (HOSPITAL_COMMUNITY): Payer: Self-pay

## 2022-01-24 ENCOUNTER — Other Ambulatory Visit (HOSPITAL_COMMUNITY): Payer: Self-pay

## 2022-01-24 DIAGNOSIS — M7062 Trochanteric bursitis, left hip: Secondary | ICD-10-CM | POA: Diagnosis not present

## 2022-01-24 DIAGNOSIS — M7061 Trochanteric bursitis, right hip: Secondary | ICD-10-CM | POA: Diagnosis not present

## 2022-01-24 DIAGNOSIS — M5451 Vertebrogenic low back pain: Secondary | ICD-10-CM | POA: Diagnosis not present

## 2022-01-24 MED ORDER — MELOXICAM 15 MG PO TABS
15.0000 mg | ORAL_TABLET | Freq: Every day | ORAL | 2 refills | Status: DC
  Filled 2022-01-24: qty 90, 90d supply, fill #0
  Filled 2022-09-03: qty 90, 90d supply, fill #1
  Filled 2023-01-03: qty 90, 90d supply, fill #2

## 2022-01-31 DIAGNOSIS — M4316 Spondylolisthesis, lumbar region: Secondary | ICD-10-CM | POA: Diagnosis not present

## 2022-02-01 DIAGNOSIS — M4316 Spondylolisthesis, lumbar region: Secondary | ICD-10-CM | POA: Diagnosis not present

## 2022-02-08 DIAGNOSIS — M4316 Spondylolisthesis, lumbar region: Secondary | ICD-10-CM | POA: Diagnosis not present

## 2022-02-12 DIAGNOSIS — M4316 Spondylolisthesis, lumbar region: Secondary | ICD-10-CM | POA: Diagnosis not present

## 2022-02-13 ENCOUNTER — Other Ambulatory Visit (HOSPITAL_COMMUNITY): Payer: Self-pay

## 2022-02-14 DIAGNOSIS — M4316 Spondylolisthesis, lumbar region: Secondary | ICD-10-CM | POA: Diagnosis not present

## 2022-02-22 ENCOUNTER — Other Ambulatory Visit (HOSPITAL_COMMUNITY): Payer: Self-pay

## 2022-02-22 ENCOUNTER — Other Ambulatory Visit: Payer: Self-pay | Admitting: Family Medicine

## 2022-02-22 DIAGNOSIS — M4316 Spondylolisthesis, lumbar region: Secondary | ICD-10-CM | POA: Diagnosis not present

## 2022-02-22 MED ORDER — VENLAFAXINE HCL ER 75 MG PO CP24
75.0000 mg | ORAL_CAPSULE | Freq: Every day | ORAL | 0 refills | Status: DC
  Filled 2022-02-22: qty 90, 90d supply, fill #0

## 2022-02-26 DIAGNOSIS — M4316 Spondylolisthesis, lumbar region: Secondary | ICD-10-CM | POA: Diagnosis not present

## 2022-02-28 DIAGNOSIS — M4316 Spondylolisthesis, lumbar region: Secondary | ICD-10-CM | POA: Diagnosis not present

## 2022-03-12 ENCOUNTER — Other Ambulatory Visit (HOSPITAL_COMMUNITY): Payer: Self-pay

## 2022-04-17 ENCOUNTER — Other Ambulatory Visit (HOSPITAL_COMMUNITY): Payer: Self-pay

## 2022-04-17 ENCOUNTER — Other Ambulatory Visit: Payer: Self-pay | Admitting: Cardiology

## 2022-04-17 MED ORDER — LOSARTAN POTASSIUM-HCTZ 100-25 MG PO TABS
1.0000 | ORAL_TABLET | Freq: Every day | ORAL | 1 refills | Status: DC
  Filled 2022-04-17: qty 90, 90d supply, fill #0
  Filled 2022-07-16: qty 90, 90d supply, fill #1

## 2022-04-19 ENCOUNTER — Encounter: Payer: Self-pay | Admitting: Family Medicine

## 2022-05-16 ENCOUNTER — Telehealth: Payer: Self-pay | Admitting: Family Medicine

## 2022-05-16 DIAGNOSIS — E7800 Pure hypercholesterolemia, unspecified: Secondary | ICD-10-CM

## 2022-05-16 NOTE — Telephone Encounter (Signed)
LVM for pt to rtn my call to schedule AWV with [facility] call back # [phone] 

## 2022-05-16 NOTE — Telephone Encounter (Signed)
-----   Message from Ellamae Sia sent at 05/16/2022 12:15 PM EDT ----- Regarding: Lab orders for Friday, 10.13.23 Patient is scheduled for CPX labs, please order future labs, Thanks , Karna Christmas

## 2022-05-17 ENCOUNTER — Ambulatory Visit (INDEPENDENT_AMBULATORY_CARE_PROVIDER_SITE_OTHER): Payer: PPO | Admitting: *Deleted

## 2022-05-17 DIAGNOSIS — Z0000 Encounter for general adult medical examination without abnormal findings: Secondary | ICD-10-CM | POA: Diagnosis not present

## 2022-05-17 DIAGNOSIS — Z1211 Encounter for screening for malignant neoplasm of colon: Secondary | ICD-10-CM | POA: Diagnosis not present

## 2022-05-17 NOTE — Progress Notes (Signed)
Subjective:   Christina Griffith is a 71 y.o. female who presents for Medicare Annual (Subsequent) preventive examination.  I connected with  [name] on 05/17/22 by a telephone enabled telemedicine application and verified that I am speaking with the correct person using two identifiers.   I discussed the limitations of evaluation and management by telemedicine. The patient expressed understanding and agreed to proceed.  Patient location: home  Provider location:  Tele-Health-home    Review of Systems     Cardiac Risk Factors include: advanced age (>50mn, >>77women);hypertension;family history of premature cardiovascular disease     Objective:    Today's Vitals   05/17/22 1003  PainSc: 3    There is no height or weight on file to calculate BMI.     05/17/2022   10:04 AM 04/26/2020    8:25 AM 04/24/2019   10:05 AM 07/01/2018    1:49 PM 04/18/2018    9:15 AM 07/03/2017    9:25 AM 04/05/2017    9:05 AM  Advanced Directives  Does Patient Have a Medical Advance Directive? Yes Yes Yes Yes Yes Yes Yes  Type of AParamedicof ARitcheyLiving will HJalapaLiving will HFunkLiving will Living will HEdenLiving will Living will HLansdowneLiving will  Does patient want to make changes to medical advance directive?    No - Patient declined   No - Patient declined  Copy of HHendersonin Chart? Yes - validated most recent copy scanned in chart (See row information) Yes - validated most recent copy scanned in chart (See row information) Yes - validated most recent copy scanned in chart (See row information)  Yes Yes Yes    Current Medications (verified) Outpatient Encounter Medications as of 05/17/2022  Medication Sig   albuterol (PROVENTIL HFA;VENTOLIN HFA) 108 (90 Base) MCG/ACT inhaler Inhale 2 puffs into the lungs every 6 (six) hours as needed  for wheezing or shortness of breath.   atorvastatin (LIPITOR) 10 MG tablet Take 1 tablet (10 mg total) by mouth daily.   Calcium Carb-Cholecalciferol 600-800 MG-UNIT TABS Take 1 tablet by mouth daily.   fexofenadine (ALLEGRA) 180 MG tablet Take 180 mg by mouth daily as needed for allergies or rhinitis.   losartan-hydrochlorothiazide (HYZAAR) 100-25 MG tablet Take 1 tablet by mouth daily.   magnesium gluconate (MAGONATE) 500 MG tablet Take 500 mg by mouth daily.   meloxicam (MOBIC) 15 MG tablet Take 1 tablet by mouth once a day   metoprolol succinate (TOPROL-XL) 50 MG 24 hr tablet Take 1.5 tablets (75 mg total) by mouth in the morning and at bedtime. Take with or immediately following a meal.   Multiple Vitamin (MULTIVITAMIN) tablet Take 1 tablet by mouth daily.   Polyethylene Glycol 3350 (MIRALAX PO) Take 17 g by mouth as needed.    Triamcinolone Acetonide (NASACORT ALLERGY 24HR NA) Place 1 spray into the nose as needed.    venlafaxine XR (EFFEXOR-XR) 75 MG 24 hr capsule Take 1 capsule (75 mg total) by mouth daily.   PFIZER-BIONT COVID-19 VAC-TRIS SUSP injection  (Patient not taking: Reported on 05/17/2022)   prednisoLONE acetate (PRED FORTE) 1 % ophthalmic suspension Place 1 drop into the right eye 4 (four) times daily.   No facility-administered encounter medications on file as of 05/17/2022.    Allergies (verified) Patient has no known allergies.   History: Past Medical History:  Diagnosis Date   Arthritis  spinal stenosis--back pain--hx of herniated cervical disk- but no surgery   Asthma    Cold 10/03/12   pt getting over a cold--still has some head congestion, slight cough   Colon polyps    Endometrial ca [facility])    H/O: hematuria    benign essential hematuria - per urology work up    History of depression    History of pneumonia    Hypertension    Numbness in right leg    pt. states occassional in right leg and right big toe from disc herniations   PAT (paroxysmal atrial  tachycardia)    noted on heart monitor 07/2020   PONV (postoperative nausea and vomiting)    with prior hip replacement   PVC's (premature ventricular contractions)    PVCs, trigeminal PVCs, WCT nonsustained with PVC load 18% by montior 07/2020   Stress incontinence    Wears glasses    Past Surgical History:  Procedure Laterality Date   BREAST SURGERY Right 1980's   breast biopsy   COLONOSCOPY     DENTAL SURGERY  11/2019   EYE SURGERY Bilateral 02/2015   cataracts   HYSTEROSCOPY WITH D & C Left 1/24/204   w/ resection of Endometrial polyps   ROBOTIC ASSISTED TOTAL HYSTERECTOMY WITH BILATERAL SALPINGO OOPHERECTOMY N/A 10/07/2012   Procedure: ROBOTIC ASSISTED TOTAL HYSTERECTOMY WITH BILATERAL SALPINGO OOPHORECTOMY/POSSIBLE LYMPH NODE BIOPSY;  Surgeon: Imagene Gurney A. Alycia Rossetti, MD;  Location: [facility];  Service: Gynecology;  Laterality: N/A;   TOTAL HIP ARTHROPLASTY     right 2002 left 2004   TOTAL HIP REVISION Right 05/09/2015   Procedure: TOTAL HIP REVISION;  Surgeon: Frederik Pear, MD;  Location: [facility];  Service: Orthopedics;  Laterality: Right;   Family History  Problem Relation Age of Onset   Hypertension Mother    Cancer Father        bladder and prostate   Hyperlipidemia Father    Cancer Maternal Grandmother        colon   Breast cancer Cousin    Social History   Socioeconomic History   Marital status: Single    Spouse name: Not on file   Number of children: Not on file   Years of education: Not on file   Highest education level: Not on file  Occupational History   Occupation: Engineer, mining    Employer: [employer]  Tobacco Use   Smoking status: Never   Smokeless tobacco: Never  Vaping Use   Vaping Use: Never used  Substance and Sexual Activity   Alcohol use: Yes    Alcohol/week: 3.0 standard drinks of alcohol    Types: 3 Glasses of wine per week    Comment: 3 x a week   Drug use: No   Sexual activity: Not on file  Other Topics Concern   Not on file  Social  History Narrative   Regular exercise- yes, 5-6 days a week, yoga   Diet: healthy   Social Determinants of Health   Financial Resource Strain: Low Risk  (05/17/2022)   Overall Financial Resource Strain (CARDIA)    Difficulty of Paying Living Expenses: Not hard at all  Food Insecurity: No Food Insecurity (05/17/2022)   Hunger Vital Sign    Worried About Running Out of Food in the Last Year: Never true    Ran Out of Food in the Last Year: Never true  Transportation Needs: No Transportation Needs (05/17/2022)   PRAPARE - Hydrologist (Medical):  No    Lack of Transportation (Non-Medical): No  Physical Activity: Sufficiently Active (05/17/2022)   Exercise Vital Sign    Days of Exercise per Week: 5 days    Minutes of Exercise per Session: 40 min  Stress: No Stress Concern Present (05/17/2022)   [place]    Feeling of Stress : Not at all  Social Connections: Unknown (05/17/2022)   Social Connection and Isolation Panel [NHANES]    Frequency of Communication with Friends and Family: Three times a week    Frequency of Social Gatherings with Friends and Family: Never    Attends Religious Services: More than 4 times per year    Active Member of [employer] or Organizations: Yes    Attends Archivist Meetings: 1 to 4 times per year    Marital Status: Not on file    Tobacco Counseling Counseling given: Not Answered   Clinical Intake:  Pre-visit preparation completed: Yes  Pain : 0-10 Pain Score: 3  Pain Location: Back Pain Descriptors / Indicators: Burning, Aching, Discomfort Pain Onset: More than a month ago Pain Frequency: Intermittent Pain Relieving Factors: aleve - tylenol-meloxicam  Pain Relieving Factors: aleve - tylenol-meloxicam  Diabetes: No  How often do you need to have someone help you when you read instructions, pamphlets, or other written materials from your doctor  or pharmacy?: 1 - Never  Diabetic?  no  Interpreter Needed?: No  Information entered by :: Leroy Kennedy LPN   Activities of Daily Living    05/17/2022   10:09 AM  In your present state of health, do you have any difficulty performing the following activities:  Hearing? 0  Vision? 0  Difficulty concentrating or making decisions? 0  Walking or climbing stairs? 0  Dressing or bathing? 0  Doing errands, shopping? 0  Preparing Food and eating ? N  Using the Toilet? N  In the past six months, have you accidently leaked urine? N  Do you have problems with loss of bowel control? N  Managing your Medications? N  Managing your Finances? N  Housekeeping or managing your Housekeeping? N    Patient Care Team: Jinny Sanders, MD as PCP - General (Family Medicine) Sueanne Margarita, MD as PCP - Cardiology (Cardiology) Vickie Epley, MD as PCP - Electrophysiology (Cardiology)  Indicate any recent Medical Services you may have received from other than [facility] providers in the past year (date may be approximate).     Assessment:   This is a routine wellness examination for [place].  Hearing/Vision screen Hearing Screening - Comments:: No hearing aids Cannot hear well in loud crowed places Vision Screening - Comments:: [facility] Up to date [place]   for Cataract recheck   Dietary issues and exercise activities discussed: Current Exercise Habits: Home exercise routine, Type of exercise: strength training/weights;walking, Time (Minutes): 25, Frequency (Times/Week): 6, Weekly Exercise (Minutes/Week): 150, Intensity: Mild   Goals Addressed             This Visit's Progress    Increase physical activity   On track    Starting 04/18/2018, I will continue to walk at least 60 minutes daily.      Patient Stated   On track    04/24/2019, Patient wants to improve her diet and eat better.     Weight (lb) < 200 lb (90.7 kg)         Depression Screen    05/17/2022   10:14 AM  04/29/2021    9:15 AM 04/26/2020    8:28 AM 04/24/2019   10:06 AM 04/18/2018    9:14 AM 04/05/2017    9:15 AM 04/03/2016   11:02 AM  PHQ 2/9 Scores  PHQ - 2 Score 0 0 0 0 0 0 0  PHQ- 9 Score 1  0 0 0 0     Fall Risk    05/17/2022   10:08 AM 04/29/2021    9:24 AM 04/26/2020    8:26 AM 04/24/2019   10:06 AM 04/18/2018    9:14 AM  Fall Risk   Falls in the past year? 0 0 1 0 No  Comment   tripped on stairs    Number falls in past yr: 0 0 1 0   Injury with Fall? 0 0 0    Risk for fall due to :   History of fall(s) Medication side effect   Follow up Falls evaluation completed;Education provided;Falls prevention discussed  Falls evaluation completed;Falls prevention discussed Falls evaluation completed;Falls prevention discussed     FALL RISK PREVENTION PERTAINING TO THE HOME:  Any stairs in or around the home? Yes  If so, are there any without handrails? No  Home free of loose throw rugs in walkways, pet beds, electrical cords, etc? Yes  Adequate lighting in your home to reduce risk of falls? Yes   ASSISTIVE DEVICES UTILIZED TO PREVENT FALLS:  Life alert? No  Use of a cane, walker or w/c? No  Grab bars in the bathroom? No  Shower chair or bench in shower? Yes  Elevated toilet seat or a handicapped toilet? No   TIMED UP AND GO:  Was the test performed? No .    Cognitive Function:    04/26/2020    8:32 AM 04/24/2019   10:11 AM 04/18/2018    9:15 AM 04/05/2017    9:20 AM  MMSE - Mini Mental State Exam  Orientation to time '5 5 5 5  '$ Orientation to Place '5 5 5 5  '$ Registration '3 3 3 3  '$ Attention/ Calculation 5 5 0 0  Recall '3 3 3 3  '$ Language- name 2 objects   0 0  Language- repeat '1 1 1 1  '$ Language- follow 3 step command   3 3  Language- read & follow direction   0 0  Write a sentence   0 0  Copy design   0 0  Total score   20 20        05/17/2022   10:06 AM 04/29/2021    9:26 AM  6CIT Screen  What Year? 0 points 0 points  What month? 0 points 0 points  What time? 0  points 0 points  Count back from 20 0 points 0 points  Months in reverse 0 points 0 points  Repeat phrase 0 points 0 points  Total Score 0 points 0 points    Immunizations Immunization History  Administered Date(s) Administered   Fluad Quad(high Dose 65+) 05/05/2020, 05/11/2022   Influenza Whole 06/06/2010   Influenza, High Dose Seasonal PF 05/08/2021   Influenza,inj,Quad PF,6+ Mos 04/15/2015, 04/30/2016, 04/18/2018, 04/17/2019   PFIZER Comirnaty(Gray Top)Covid-19 Tri-Sucrose Vaccine 05/11/2022   PFIZER(Purple Top)SARS-COV-2 Vaccination 08/20/2019, 09/09/2019, 05/14/2020, 02/03/2021   Pfizer Covid-19 Vaccine Bivalent Booster 16yr & up 05/08/2021   Pneumococcal Conjugate-13 03/16/2014   Pneumococcal Polysaccharide-23 12/14/2011, 04/05/2017   Td 08/29/2007   Tdap 07/15/2018   Zoster Recombinat (Shingrix) 10/26/2020, 01/13/2021   Zoster, Live 11/24/2010    TDAP status:  Up to date  Flu Vaccine status: Up to date  Pneumococcal vaccine status: Up to date  Covid-19 vaccine status: Information provided on how to obtain vaccines.   Qualifies for Shingles Vaccine? No   Zostavax completed Yes   Shingrix Completed?: Yes  Screening Tests Health Maintenance  Topic Date Due   PAP SMEAR-Modifier  05/23/2022   COLONOSCOPY (Pts 45-17yr Insurance coverage will need to be confirmed)  08/04/2022   COVID-19 Vaccine (6 - Pfizer series) 09/11/2022   MAMMOGRAM  10/06/2022   TETANUS/TDAP  07/15/2028   Pneumonia Vaccine 72 Years old  Completed   INFLUENZA VACCINE  Completed   DEXA SCAN  Completed   Hepatitis C Screening  Completed   Zoster Vaccines- Shingrix  Completed   HPV VACCINES  Aged Out    Health Maintenance  Health Maintenance Due  Topic Date Due   PAP SMEAR-Modifier  05/23/2022    Colorectal cancer screening: Referral to GI placed  . Pt aware the office will call re: appt.  Mammogram status: Completed  . Repeat every year  Bone Density completed 2023 normal  Lung  Cancer Screening: (Low Dose CT Chest recommended if Age 72-80years, 30 pack-year currently smoking OR have quit w/in 15years.) does not qualify.   Lung Cancer Screening Referral:   Additional Screening:  Hepatitis C Screening: does not qualify; Completed 2014  Vision Screening: Recommended annual ophthalmology exams for early detection of glaucoma and other disorders of the eye. Is the patient up to date with their annual eye exam?  Yes  Who is the provider or what is the name of the office in which the patient attends annual eye exams? [facility] If pt is not established with a provider, would they like to be referred to a provider to establish care? No .   Dental Screening: Recommended annual dental exams for proper oral hygiene  Community Resource Referral / Chronic Care Management: CRR required this visit?  No   CCM required this visit?  No      Plan:     I have personally reviewed and noted the following in the patient's chart:   Medical and social history Use of alcohol, tobacco or illicit drugs  Current medications and supplements including opioid prescriptions. Patient is not currently taking opioid prescriptions. Functional ability and status Nutritional status Physical activity Advanced directives List of other physicians Hospitalizations, surgeries, and ER visits in previous 12 months Vitals Screenings to include cognitive, depression, and falls Referrals and appointments  In addition, I have reviewed and discussed with patient certain preventive protocols, quality metrics, and best practice recommendations. A written personalized care plan for preventive services as well as general preventive health recommendations were provided to patient.     JLeroy Kennedy LPN   [id number]  Nurse Notes:

## 2022-05-17 NOTE — Patient Instructions (Signed)
Christina Griffith , Thank you for taking time to come for your Medicare Wellness Visit. I appreciate your ongoing commitment to your health goals. Please review the following plan we discussed and let me know if I can assist you in the future.   These are the goals we discussed:  Goals      Increase physical activity     Starting 04/18/2018, I will continue to walk at least 60 minutes daily.      Patient Stated     04/24/2019, Patient wants to improve her diet and eat better.     Patient Stated     04/26/2020, I will continue to do yoga 3 days a week and exercise bike 2 days a week for about 30 minutes.      Weight (lb) < 200 lb (90.7 kg)        This is a list of the screening recommended for you and due dates:  Health Maintenance  Topic Date Due   Pap Smear  05/23/2022   Colon Cancer Screening  08/04/2022   COVID-19 Vaccine (6 - Pfizer series) 09/11/2022   Mammogram  10/06/2022   Tetanus Vaccine  07/15/2028   Pneumonia Vaccine  Completed   Flu Shot  Completed   DEXA scan (bone density measurement)  Completed   Hepatitis C Screening: USPSTF Recommendation to screen - Ages 33-79 yo.  Completed   Zoster (Shingles) Vaccine  Completed   HPV Vaccine  Aged Out    Advanced directives: on file       Preventive Care 85 Years and Older, Female Preventive care refers to lifestyle choices and visits with your health care provider that can promote health and wellness. What does preventive care include? A yearly physical exam. This is also called an annual well check. Dental exams once or twice a year. Routine eye exams. Ask your health care provider how often you should have your eyes checked. Personal lifestyle choices, including: Daily care of your teeth and gums. Regular physical activity. Eating a healthy diet. Avoiding tobacco and drug use. Limiting alcohol use. Practicing safe sex. Taking low-dose aspirin every day. Taking vitamin and mineral supplements as recommended by your  health care provider. What happens during an annual well check? The services and screenings done by your health care provider during your annual well check will depend on your age, overall health, lifestyle risk factors, and family history of disease. Counseling  Your health care provider may ask you questions about your: Alcohol use. Tobacco use. Drug use. Emotional well-being. Home and relationship well-being. Sexual activity. Eating habits. History of falls. Memory and ability to understand (cognition). Work and work Statistician. Reproductive health. Screening  You may have the following tests or measurements: Height, weight, and BMI. Blood pressure. Lipid and cholesterol levels. These may be checked every 5 years, or more frequently if you are over 34 years old. Skin check. Lung cancer screening. You may have this screening every year starting at age 40 if you have a 30-pack-year history of smoking and currently smoke or have quit within the past 15 years. Fecal occult blood test (FOBT) of the stool. You may have this test every year starting at age 36. Flexible sigmoidoscopy or colonoscopy. You may have a sigmoidoscopy every 5 years or a colonoscopy every 10 years starting at age 18. Hepatitis C blood test. Hepatitis B blood test. Sexually transmitted disease (STD) testing. Diabetes screening. This is done by checking your blood sugar (glucose) after you have not eaten  for a while (fasting). You may have this done every 1-3 years. Bone density scan. This is done to screen for osteoporosis. You may have this done starting at age 64. Mammogram. This may be done every 1-2 years. Talk to your health care provider about how often you should have regular mammograms. Talk with your health care provider about your test results, treatment options, and if necessary, the need for more tests. Vaccines  Your health care provider may recommend certain vaccines, such as: Influenza vaccine.  This is recommended every year. Tetanus, diphtheria, and acellular pertussis (Tdap, Td) vaccine. You may need a Td booster every 10 years. Zoster vaccine. You may need this after age 55. Pneumococcal 13-valent conjugate (PCV13) vaccine. One dose is recommended after age 96. Pneumococcal polysaccharide (PPSV23) vaccine. One dose is recommended after age 32. Talk to your health care provider about which screenings and vaccines you need and how often you need them. This information is not intended to replace advice given to you by your health care provider. Make sure you discuss any questions you have with your health care provider. Document Released: 08/19/2015 Document Revised: 04/11/2016 Document Reviewed: 05/24/2015 Elsevier Interactive Patient Education  2017 [employer] Prevention in the Home Falls can cause injuries. They can happen to people of all ages. There are many things you can do to make your home safe and to help prevent falls. What can I do on the outside of my home? Regularly fix the edges of walkways and driveways and fix any cracks. Remove anything that might make you trip as you walk through a door, such as a raised step or threshold. Trim any bushes or trees on the path to your home. Use bright outdoor lighting. Clear any walking paths of anything that might make someone trip, such as rocks or tools. Regularly check to see if handrails are loose or broken. Make sure that both sides of any steps have handrails. Any raised decks and porches should have guardrails on the edges. Have any leaves, snow, or ice cleared regularly. Use sand or salt on walking paths during winter. Clean up any spills in your garage right away. This includes oil or grease spills. What can I do in the bathroom? Use night lights. Install grab bars by the toilet and in the tub and shower. Do not use towel bars as grab bars. Use non-skid mats or decals in the tub or shower. If you need to sit  down in the shower, use a plastic, non-slip stool. Keep the floor dry. Clean up any water that spills on the floor as soon as it happens. Remove soap buildup in the tub or shower regularly. Attach bath mats securely with double-sided non-slip rug tape. Do not have throw rugs and other things on the floor that can make you trip. What can I do in the bedroom? Use night lights. Make sure that you have a light by your bed that is easy to reach. Do not use any sheets or blankets that are too big for your bed. They should not hang down onto the floor. Have a firm chair that has side arms. You can use this for support while you get dressed. Do not have throw rugs and other things on the floor that can make you trip. What can I do in the kitchen? Clean up any spills right away. Avoid walking on wet floors. Keep items that you use a lot in easy-to-reach places. If you need to reach something  above you, use a strong step stool that has a grab bar. Keep electrical cords out of the way. Do not use floor polish or wax that makes floors slippery. If you must use wax, use non-skid floor wax. Do not have throw rugs and other things on the floor that can make you trip. What can I do with my stairs? Do not leave any items on the stairs. Make sure that there are handrails on both sides of the stairs and use them. Fix handrails that are broken or loose. Make sure that handrails are as long as the stairways. Check any carpeting to make sure that it is firmly attached to the stairs. Fix any carpet that is loose or worn. Avoid having throw rugs at the top or bottom of the stairs. If you do have throw rugs, attach them to the floor with carpet tape. Make sure that you have a light switch at the top of the stairs and the bottom of the stairs. If you do not have them, ask someone to add them for you. What else can I do to help prevent falls? Wear shoes that: Do not have high heels. Have rubber bottoms. Are  comfortable and fit you well. Are closed at the toe. Do not wear sandals. If you use a stepladder: Make sure that it is fully opened. Do not climb a closed stepladder. Make sure that both sides of the stepladder are locked into place. Ask someone to hold it for you, if possible. Clearly mark and make sure that you can see: Any grab bars or handrails. First and last steps. Where the edge of each step is. Use tools that help you move around (mobility aids) if they are needed. These include: Canes. Walkers. Scooters. Crutches. Turn on the lights when you go into a dark area. Replace any light bulbs as soon as they burn out. Set up your furniture so you have a clear path. Avoid moving your furniture around. If any of your floors are uneven, fix them. If there are any pets around you, be aware of where they are. Review your medicines with your doctor. Some medicines can make you feel dizzy. This can increase your chance of falling. Ask your doctor what other things that you can do to help prevent falls. This information is not intended to replace advice given to you by your health care provider. Make sure you discuss any questions you have with your health care provider. Document Released: 05/19/2009 Document Revised: 12/29/2015 Document Reviewed: 08/27/2014 Elsevier Interactive Patient Education  2017 [employer].

## 2022-05-17 NOTE — Addendum Note (Signed)
Addended by: Suszanne Finch on: 05/17/2022 11:14 AM   Modules accepted: Orders

## 2022-05-18 ENCOUNTER — Other Ambulatory Visit (INDEPENDENT_AMBULATORY_CARE_PROVIDER_SITE_OTHER): Payer: PPO

## 2022-05-18 ENCOUNTER — Telehealth: Payer: Self-pay | Admitting: Family Medicine

## 2022-05-18 DIAGNOSIS — E7800 Pure hypercholesterolemia, unspecified: Secondary | ICD-10-CM | POA: Diagnosis not present

## 2022-05-18 DIAGNOSIS — Z1211 Encounter for screening for malignant neoplasm of colon: Secondary | ICD-10-CM

## 2022-05-18 LAB — LIPID PANEL
Cholesterol: 194 mg/dL (ref 0–200)
HDL: 57.8 mg/dL (ref >39.00)
NonHDL: 136.58
Total CHOL/HDL Ratio: 3
Triglycerides: 206 mg/dL — ABNORMAL HIGH (ref 0.0–149.0)
VLDL: 41.2 mg/dL — ABNORMAL HIGH (ref 0.0–40.0)

## 2022-05-18 LAB — LDL CHOLESTEROL, DIRECT: Direct LDL: 104 mg/dL

## 2022-05-18 LAB — COMPREHENSIVE METABOLIC PANEL
ALT: 44 U/L — ABNORMAL HIGH (ref 0–35)
AST: 39 U/L — ABNORMAL HIGH (ref 0–37)
Albumin: 4.3 g/dL (ref 3.5–5.2)
Alkaline Phosphatase: 77 U/L (ref 39–117)
BUN: 21 mg/dL (ref 6–23)
CO2: 29 mEq/L (ref 19–32)
Calcium: 9.6 mg/dL (ref 8.4–10.5)
Chloride: 101 mEq/L (ref 96–112)
Creatinine, Ser: 0.83 mg/dL (ref 0.40–1.20)
GFR: 70.39 mL/min (ref >60.00)
Glucose, Bld: 90 mg/dL (ref 70–99)
Potassium: 4 mEq/L (ref 3.5–5.1)
Sodium: 139 mEq/L (ref 135–145)
Total Bilirubin: 0.4 mg/dL (ref 0.2–1.2)
Total Protein: 7.3 g/dL (ref 6.0–8.3)

## 2022-05-18 NOTE — Telephone Encounter (Signed)
Patient sent a message through [place] and would like for her colonscopy to be done with Juanita Craver, MD at [facility], [address] in [place].

## 2022-05-18 NOTE — Progress Notes (Signed)
No critical labs need to be addressed urgently. We will discuss labs in detail at upcoming office visit.   

## 2022-05-21 ENCOUNTER — Other Ambulatory Visit: Payer: Self-pay | Admitting: Family Medicine

## 2022-05-21 ENCOUNTER — Other Ambulatory Visit (HOSPITAL_COMMUNITY): Payer: Self-pay

## 2022-05-21 MED ORDER — VENLAFAXINE HCL ER 75 MG PO CP24
75.0000 mg | ORAL_CAPSULE | Freq: Every day | ORAL | 0 refills | Status: DC
  Filled 2022-05-21: qty 90, 90d supply, fill #0

## 2022-05-21 NOTE — Addendum Note (Signed)
Addended by: Eliezer Lofts E on: 05/21/2022 04:28 PM   Modules accepted: Orders

## 2022-05-30 ENCOUNTER — Other Ambulatory Visit (HOSPITAL_COMMUNITY): Payer: Self-pay

## 2022-06-05 DIAGNOSIS — Z86010 Personal history of colonic polyps: Secondary | ICD-10-CM | POA: Diagnosis not present

## 2022-06-05 DIAGNOSIS — Z83719 Family history of colon polyps, unspecified: Secondary | ICD-10-CM | POA: Diagnosis not present

## 2022-06-05 DIAGNOSIS — Z1211 Encounter for screening for malignant neoplasm of colon: Secondary | ICD-10-CM | POA: Diagnosis not present

## 2022-06-05 DIAGNOSIS — K625 Hemorrhage of anus and rectum: Secondary | ICD-10-CM | POA: Diagnosis not present

## 2022-06-05 DIAGNOSIS — Z800 Family history of malignant neoplasm of digestive organs: Secondary | ICD-10-CM | POA: Diagnosis not present

## 2022-06-05 DIAGNOSIS — K5904 Chronic idiopathic constipation: Secondary | ICD-10-CM | POA: Diagnosis not present

## 2022-06-06 ENCOUNTER — Encounter: Payer: Self-pay | Admitting: Family Medicine

## 2022-06-06 ENCOUNTER — Ambulatory Visit (INDEPENDENT_AMBULATORY_CARE_PROVIDER_SITE_OTHER): Payer: PPO | Admitting: Family Medicine

## 2022-06-06 VITALS — BP 118/68 | HR 80 | Temp 97.8°F | Ht 67.0 in | Wt 168.0 lb

## 2022-06-06 DIAGNOSIS — E7800 Pure hypercholesterolemia, unspecified: Secondary | ICD-10-CM | POA: Diagnosis not present

## 2022-06-06 DIAGNOSIS — K5904 Chronic idiopathic constipation: Secondary | ICD-10-CM | POA: Diagnosis not present

## 2022-06-06 DIAGNOSIS — I10 Essential (primary) hypertension: Secondary | ICD-10-CM | POA: Diagnosis not present

## 2022-06-06 DIAGNOSIS — Z0000 Encounter for general adult medical examination without abnormal findings: Secondary | ICD-10-CM | POA: Diagnosis not present

## 2022-06-06 DIAGNOSIS — J4520 Mild intermittent asthma, uncomplicated: Secondary | ICD-10-CM | POA: Diagnosis not present

## 2022-06-06 DIAGNOSIS — R7989 Other specified abnormal findings of blood chemistry: Secondary | ICD-10-CM | POA: Diagnosis not present

## 2022-06-06 LAB — CBC WITH DIFFERENTIAL/PLATELET
Basophils Absolute: 0 10*3/uL (ref 0.0–0.1)
Basophils Relative: 0.5 % (ref 0.0–3.0)
Eosinophils Absolute: 0.2 10*3/uL (ref 0.0–0.7)
Eosinophils Relative: 2.7 % (ref 0.0–5.0)
HCT: 38.8 % (ref 36.0–46.0)
Hemoglobin: 12.4 g/dL (ref 12.0–15.0)
Lymphocytes Relative: 34.4 % (ref 12.0–46.0)
Lymphs Abs: 2.4 10*3/uL (ref 0.7–4.0)
MCHC: 32.1 g/dL (ref 30.0–36.0)
MCV: 88.4 fl (ref 78.0–100.0)
Monocytes Absolute: 0.8 10*3/uL (ref 0.1–1.0)
Monocytes Relative: 10.8 % (ref 3.0–12.0)
Neutro Abs: 3.7 10*3/uL (ref 1.4–7.7)
Neutrophils Relative %: 51.6 % (ref 43.0–77.0)
Platelets: 281 10*3/uL (ref 150.0–400.0)
RBC: 4.39 Mil/uL (ref 3.87–5.11)
RDW: 14.5 % (ref 11.5–15.5)
WBC: 7.1 10*3/uL (ref 4.0–10.5)

## 2022-06-06 LAB — TSH: TSH: 0.74 u[IU]/mL (ref 0.35–5.50)

## 2022-06-06 NOTE — Patient Instructions (Addendum)
Please stop at the lab to have labs drawn. Work on low cholesterol low fat diet, keep up with the exercise.   Work on decreasing tylenol and ETOH use, decrease fat in diet.  Return in 3  Months for recheck of LFTs and hepatitis panel.

## 2022-06-06 NOTE — Assessment & Plan Note (Signed)
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  We will check CBC and TSH today.  Possible secondary cause.  She would like [place] to fax lab results to Dr. Collene Mares.

## 2022-06-06 NOTE — Assessment & Plan Note (Signed)
Chronic, continued mild occasional flares.  Using albuterol as needed.

## 2022-06-06 NOTE — Progress Notes (Signed)
Patient ID: Christina Griffith, female    DOB: [birth year], 71 y.o.   MRN: [id number]  This visit was conducted in person.  BP 118/68 (BP Location: Right Arm, Patient Position: Sitting)   Pulse 80   Temp 97.8 F (36.6 C) (Skin)   Ht '5\' 7"'$  (1.702 m)   Wt 168 lb (76.2 kg)   SpO2 97%   BMI 26.31 kg/m    CC:  Chief Complaint  Patient presents with   Annual Exam     HPI: Christina Griffith is a 71 y.o. female presenting on 06/06/2022 for Annual Exam  The patient presents for complete physical and review of chronic health problems. He/She also has the following acute concerns today:  Has had a yearly eye exam..treatement for scar tissue from cataract surgery.  Elevated Cholesterol: LDL at goal on atorvastatin 10 mg p.o. daily Lab Results  Component Value Date   CHOL 194 05/18/2022   HDL 57.80 05/18/2022   LDLCALC 78 05/23/2021   LDLDIRECT 104.0 05/18/2022   TRIG 206.0 (H) 05/18/2022   CHOLHDL 3 05/18/2022  Using medications without problems:none Muscle aches:  none Diet compliance:  good. Exercise: Home PT and walking 15-20 min several times a day. Other complaints:  Hypertension:  Well controlled on losartan hydrochlorothiazide 100/25 mg p.o. daily, metoprolol 50 mg 1 tablets p.o. a.m. and  1.5 in p.m. BP Readings from Last 3 Encounters:  06/06/22 118/68  12/26/21 112/66  10/10/21 118/72  Using medication without problems or lightheadedness:  occ with standing quickly Chest pain with exertion: none Edema: none Short of breath: none Average home BPs: Other issues:  Mild intermittent asthma:  PVCs, PAT, PSVT: Followed by cardiology... metoprolol changed  to lower AM dose given fatigue. Reviewed last OV 12/2021  In last year has been seeing Ortho for back pain.   Hip films showed hardware in place from past   Lumbar spine film showed DDD... treated with 2 weeks of meloxicam and PT.   Improved until last 2 weeks back pain has increased again. No fall. Very  stiff, decreased ROM.  Relevant past medical, surgical, family and social history reviewed and updated as indicated. Interim medical history since our last visit reviewed. Allergies and medications reviewed and updated. Outpatient Medications Prior to Visit  Medication Sig Dispense Refill   albuterol (PROVENTIL HFA;VENTOLIN HFA) 108 (90 Base) MCG/ACT inhaler Inhale 2 puffs into the lungs every 6 (six) hours as needed for wheezing or shortness of breath. 1 Inhaler 2   atorvastatin (LIPITOR) 10 MG tablet Take 1 tablet (10 mg total) by mouth daily. 90 tablet 3   Calcium Carb-Cholecalciferol 600-800 MG-UNIT TABS Take 1 tablet by mouth daily.     fexofenadine (ALLEGRA) 180 MG tablet Take 180 mg by mouth daily as needed for allergies or rhinitis.     losartan-hydrochlorothiazide (HYZAAR) 100-25 MG tablet Take 1 tablet by mouth daily. 90 tablet 1   magnesium gluconate (MAGONATE) 500 MG tablet Take 500 mg by mouth daily.     meloxicam (MOBIC) 15 MG tablet Take 1 tablet by mouth once a day 90 tablet 2   metoprolol succinate (TOPROL-XL) 50 MG 24 hr tablet Take 1.5 tablets (75 mg total) by mouth in the morning and at bedtime. Take with or immediately following a meal. 270 tablet 3   Multiple Vitamin (MULTIVITAMIN) tablet Take 1 tablet by mouth daily.     PFIZER-BIONT COVID-19 VAC-TRIS SUSP injection      Polyethylene Glycol 3350 (MIRALAX PO)  Take 17 g by mouth as needed.      Triamcinolone Acetonide (NASACORT ALLERGY 24HR NA) Place 1 spray into the nose as needed.      venlafaxine XR (EFFEXOR-XR) 75 MG 24 hr capsule Take 1 capsule (75 mg total) by mouth daily. 90 capsule 0   prednisoLONE acetate (PRED FORTE) 1 % ophthalmic suspension Place 1 drop into the right eye 4 (four) times daily. 5 mL 0   No facility-administered medications prior to visit.     Per HPI unless specifically indicated in ROS section below Review of Systems  Constitutional:  Negative for fatigue and fever.  HENT:  Negative for  congestion.   Eyes:  Negative for pain.  Respiratory:  Negative for cough and shortness of breath.   Cardiovascular:  Negative for chest pain, palpitations and leg swelling.  Gastrointestinal:  Negative for abdominal pain.  Genitourinary:  Negative for dysuria and vaginal bleeding.  Musculoskeletal:  Negative for back pain.  Neurological:  Negative for syncope, light-headedness and headaches.  Psychiatric/Behavioral:  Negative for dysphoric mood.    Objective:  BP 118/68 (BP Location: Right Arm, Patient Position: Sitting)   Pulse 80   Temp 97.8 F (36.6 C) (Skin)   Ht '5\' 7"'$  (1.702 m)   Wt 168 lb (76.2 kg)   SpO2 97%   BMI 26.31 kg/m   Wt Readings from Last 3 Encounters:  06/06/22 168 lb (76.2 kg)  12/26/21 169 lb 3.2 oz (76.7 kg)  10/10/21 168 lb 9.6 oz (76.5 kg)      Physical Exam Vitals and nursing note reviewed.  Constitutional:      General: She is not in acute distress.    Appearance: Normal appearance. She is well-developed. She is not ill-appearing or toxic-appearing.  HENT:     Head: Normocephalic.     Right Ear: Hearing, tympanic membrane, ear canal and external ear normal.     Left Ear: Hearing, tympanic membrane, ear canal and external ear normal.     Nose: Nose normal.  Eyes:     General: Lids are normal. Lids are everted, no foreign bodies appreciated.     Conjunctiva/sclera: Conjunctivae normal.     Pupils: Pupils are equal, round, and reactive to light.  Neck:     Thyroid: No thyroid mass or thyromegaly.     Vascular: No carotid bruit.     Trachea: Trachea normal.  Cardiovascular:     Rate and Rhythm: Normal rate and regular rhythm.     Heart sounds: Normal heart sounds, S1 normal and S2 normal. No murmur heard.    No gallop.  Pulmonary:     Effort: Pulmonary effort is normal. No respiratory distress.     Breath sounds: Normal breath sounds. No wheezing, rhonchi or rales.  Abdominal:     General: Bowel sounds are normal. There is no distension or  abdominal bruit.     Palpations: Abdomen is soft. There is no fluid wave or mass.     Tenderness: There is abdominal tenderness in the left lower quadrant. There is no right CVA tenderness, left CVA tenderness, guarding or rebound.     Hernia: No hernia is present.  Musculoskeletal:     Cervical back: Normal range of motion and neck supple.  Lymphadenopathy:     Cervical: No cervical adenopathy.  Skin:    General: Skin is warm and dry.     Findings: No rash.  Neurological:     Mental Status: She is alert.  Cranial Nerves: No cranial nerve deficit.     Sensory: No sensory deficit.  Psychiatric:        Mood and Affect: Mood is not anxious or depressed.        Speech: Speech normal.        Behavior: Behavior normal. Behavior is cooperative.        Judgment: Judgment normal.       Results for orders placed or performed in visit on 05/18/22  Comprehensive metabolic panel  Result Value Ref Range   Sodium 139 135 - 145 mEq/L   Potassium 4.0 3.5 - 5.1 mEq/L   Chloride 101 96 - 112 mEq/L   CO2 29 19 - 32 mEq/L   Glucose, Bld 90 70 - 99 mg/dL   BUN 21 6 - 23 mg/dL   Creatinine, Ser 0.83 0.40 - 1.20 mg/dL   Total Bilirubin 0.4 0.2 - 1.2 mg/dL   Alkaline Phosphatase 77 39 - 117 U/L   AST 39 (H) 0 - 37 U/L   ALT 44 (H) 0 - 35 U/L   Total Protein 7.3 6.0 - 8.3 g/dL   Albumin 4.3 3.5 - 5.2 g/dL   GFR 70.39 >60.00 mL/min   Calcium 9.6 8.4 - 10.5 mg/dL  Lipid panel  Result Value Ref Range   Cholesterol 194 0 - 200 mg/dL   Triglycerides 206.0 (H) 0.0 - 149.0 mg/dL   HDL 57.80 >39.00 mg/dL   VLDL 41.2 (H) 0.0 - 40.0 mg/dL   Total CHOL/HDL Ratio 3    NonHDL 136.58   LDL cholesterol, direct  Result Value Ref Range   Direct LDL 104.0 mg/dL     COVID 19 screen:  No recent travel or known exposure to COVID19 The patient denies respiratory symptoms of COVID 19 at this time. The importance of social distancing was discussed today.   Assessment and Plan The patient's preventative  maintenance and recommended screening tests for an annual wellness exam were reviewed in full today. Brought up to date unless services declined.  Counselled on the importance of diet, exercise, and its role in overall health and mortality. The patient's FH and SH was reviewed, including their home life, tobacco status, and drug and alcohol status.    Vaccines:uptodate with PNA,  shingrix, tdap  and flu.Nonsmoker   S/P COVID series x 5 Mammo: nml in 10/2021, repeat q 1 years given endometrial cancer DVE/PAP:  Pap 05/2021 nml, hx of endometrial cancer, per GYN check q2 years Colon: Colonoscopy  07/2015, hx of polyps, repeat in 5 years.  Dr. Collene Mares Scheduled Dec 2023. 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 SE of constipation. Stopped  Fosamax after surgery.  Repeat  DXA 12/2015, 10/2021 NORMAL on no med.   HCV neg in 2014.    Problem List Items Addressed This Visit     Chronic idiopathic constipation    Chronic, she is working on increasing water and fiber in her diet.  We will check CBC and TSH today.  Possible secondary cause.  She would like [place] to fax lab results to Dr. Collene Mares.      Relevant Orders   CBC with Differential/Platelet   TSH   Elevated LFTs     Has bene increasing from last year, slightly above normal range.  She has been taking more tylenol in last 3 months. She is on statin.  1-2 glasses of wine in last wee.  No family history of liver  Issues.   She will work  on decreasing tylenol and ETOH use, decrease fat in diet.  return in 3  Months for recheck of LFTs and hepatitis panel.  if not resolved... consider liver US.      Relevant Orders   Comprehensive metabolic panel   Lipid panel   Hepatitis panel, acute   HYPERCHOLESTEROLEMIA    Chronic, inadequate control despite atorvastatin 10 mg p.o. daily.  She will work on lower cholesterol diet and continue regular exercise.  She will return in 3 months for reevaluation.      Hypertension    Stable, chronic.   Continue current medication. Well controlled on losartan hydrochlorothiazide 100/25 mg p.o. daily, metoprolol 50 mg 1 tablets p.o. a.m. and  1.5 in p.m.      Mild intermittent asthma    Chronic, continued mild occasional flares.  Using albuterol as needed.      Other Visit Diagnoses     Routine general medical examination at a health care facility    -  Primary        Eliezer Lofts, MD

## 2022-06-06 NOTE — Assessment & Plan Note (Signed)
Stable, chronic.  Continue current medication. Well controlled on losartan hydrochlorothiazide 100/25 mg p.o. daily, metoprolol 50 mg 1 tablets p.o. a.m. and  1.5 in p.m.

## 2022-06-06 NOTE — Assessment & Plan Note (Signed)
Chronic, inadequate control despite atorvastatin 10 mg p.o. daily.  She will work on lower cholesterol diet and continue regular exercise.  She will return in 3 months for reevaluation.

## 2022-06-06 NOTE — Assessment & Plan Note (Addendum)
Has bene increasing from last year, slightly above normal range.  She has been taking more tylenol in last 3 months. She is on statin.  1-2 glasses of wine in last wee.  No family history of liver  Issues.   She will work on decreasing tylenol and ETOH use, decrease fat in diet.  return in 3  Months for recheck of LFTs and hepatitis panel.  if not resolved... consider liver US.

## 2022-06-11 ENCOUNTER — Other Ambulatory Visit: Payer: Self-pay | Admitting: Family Medicine

## 2022-06-11 ENCOUNTER — Other Ambulatory Visit (HOSPITAL_COMMUNITY): Payer: Self-pay

## 2022-06-11 MED ORDER — ATORVASTATIN CALCIUM 10 MG PO TABS
10.0000 mg | ORAL_TABLET | Freq: Every day | ORAL | 0 refills | Status: DC
  Filled 2022-06-11: qty 90, 90d supply, fill #0

## 2022-07-02 ENCOUNTER — Other Ambulatory Visit (HOSPITAL_COMMUNITY): Payer: Self-pay

## 2022-07-02 MED ORDER — CLENPIQ 10-3.5-12 MG-GM -GM/175ML PO SOLN
ORAL | 0 refills | Status: DC
  Filled 2022-07-02: qty 350, 1d supply, fill #0

## 2022-07-04 ENCOUNTER — Other Ambulatory Visit (HOSPITAL_COMMUNITY): Payer: Self-pay

## 2022-07-16 ENCOUNTER — Other Ambulatory Visit (HOSPITAL_COMMUNITY): Payer: Self-pay

## 2022-07-18 ENCOUNTER — Encounter: Payer: Self-pay | Admitting: Family Medicine

## 2022-07-18 DIAGNOSIS — Z86010 Personal history of colonic polyps: Secondary | ICD-10-CM | POA: Diagnosis not present

## 2022-07-18 DIAGNOSIS — K625 Hemorrhage of anus and rectum: Secondary | ICD-10-CM | POA: Diagnosis not present

## 2022-07-18 DIAGNOSIS — Z1211 Encounter for screening for malignant neoplasm of colon: Secondary | ICD-10-CM | POA: Diagnosis not present

## 2022-07-18 LAB — HM COLONOSCOPY

## 2022-08-20 ENCOUNTER — Other Ambulatory Visit (HOSPITAL_COMMUNITY): Payer: Self-pay

## 2022-08-20 ENCOUNTER — Other Ambulatory Visit: Payer: Self-pay | Admitting: Family Medicine

## 2022-08-20 MED ORDER — VENLAFAXINE HCL ER 75 MG PO CP24
75.0000 mg | ORAL_CAPSULE | Freq: Every day | ORAL | 1 refills | Status: DC
  Filled 2022-08-20: qty 90, 90d supply, fill #0
  Filled 2022-11-15: qty 90, 90d supply, fill #1

## 2022-09-03 ENCOUNTER — Other Ambulatory Visit (HOSPITAL_COMMUNITY): Payer: Self-pay

## 2022-09-03 ENCOUNTER — Other Ambulatory Visit: Payer: Self-pay | Admitting: Family Medicine

## 2022-09-03 MED ORDER — ATORVASTATIN CALCIUM 10 MG PO TABS
10.0000 mg | ORAL_TABLET | Freq: Every day | ORAL | 0 refills | Status: DC
  Filled 2022-09-03: qty 90, 90d supply, fill #0

## 2022-09-04 ENCOUNTER — Other Ambulatory Visit (HOSPITAL_COMMUNITY): Payer: Self-pay

## 2022-09-04 DIAGNOSIS — M5441 Lumbago with sciatica, right side: Secondary | ICD-10-CM | POA: Diagnosis not present

## 2022-09-04 MED ORDER — TRAMADOL HCL 50 MG PO TABS
50.0000 mg | ORAL_TABLET | Freq: Four times a day (QID) | ORAL | 0 refills | Status: DC | PRN
  Filled 2022-09-04: qty 20, 5d supply, fill #0

## 2022-09-06 ENCOUNTER — Other Ambulatory Visit (HOSPITAL_COMMUNITY): Payer: Self-pay

## 2022-09-06 DIAGNOSIS — M545 Low back pain, unspecified: Secondary | ICD-10-CM | POA: Diagnosis not present

## 2022-09-06 MED ORDER — PREDNISONE 10 MG (21) PO TBPK
ORAL_TABLET | ORAL | 0 refills | Status: DC
  Filled 2022-09-06: qty 21, 6d supply, fill #0

## 2022-09-12 DIAGNOSIS — M545 Low back pain, unspecified: Secondary | ICD-10-CM | POA: Diagnosis not present

## 2022-09-18 ENCOUNTER — Telehealth: Payer: Self-pay | Admitting: Family Medicine

## 2022-09-18 DIAGNOSIS — E7800 Pure hypercholesterolemia, unspecified: Secondary | ICD-10-CM

## 2022-09-18 DIAGNOSIS — R7989 Other specified abnormal findings of blood chemistry: Secondary | ICD-10-CM

## 2022-09-18 NOTE — Telephone Encounter (Signed)
Patient called to schedule labs for next week, states she needs a lipid panel and Complete metabolic panel  Only saw one future order, please advise  Patient scheduled for next Wednesday

## 2022-09-25 ENCOUNTER — Telehealth: Payer: Self-pay | Admitting: Family Medicine

## 2022-09-25 DIAGNOSIS — E7800 Pure hypercholesterolemia, unspecified: Secondary | ICD-10-CM

## 2022-09-25 NOTE — Telephone Encounter (Signed)
-----   Message from Velna Hatchet, RT sent at 09/18/2022  2:27 PM EST ----- Regarding: Wed 2/21 lab Future labs orders needed for labs only appt on 09/26/22, please.  Thanks, Anda Kraft

## 2022-09-26 ENCOUNTER — Other Ambulatory Visit (INDEPENDENT_AMBULATORY_CARE_PROVIDER_SITE_OTHER): Payer: PPO

## 2022-09-26 DIAGNOSIS — Z1159 Encounter for screening for other viral diseases: Secondary | ICD-10-CM | POA: Diagnosis not present

## 2022-09-26 DIAGNOSIS — R7989 Other specified abnormal findings of blood chemistry: Secondary | ICD-10-CM | POA: Diagnosis not present

## 2022-09-26 DIAGNOSIS — E7800 Pure hypercholesterolemia, unspecified: Secondary | ICD-10-CM | POA: Diagnosis not present

## 2022-09-26 LAB — LIPID PANEL
Cholesterol: 193 mg/dL (ref 0–200)
HDL: 57.5 mg/dL (ref >39.00)
LDL Cholesterol: 98 mg/dL (ref 0–99)
NonHDL: 135.37
Total CHOL/HDL Ratio: 3
Triglycerides: 186 mg/dL — ABNORMAL HIGH (ref 0.0–149.0)
VLDL: 37.2 mg/dL (ref 0.0–40.0)

## 2022-09-26 LAB — COMPREHENSIVE METABOLIC PANEL
ALT: 34 U/L (ref 0–35)
AST: 30 U/L (ref 0–37)
Albumin: 4.3 g/dL (ref 3.5–5.2)
Alkaline Phosphatase: 71 U/L (ref 39–117)
BUN: 21 mg/dL (ref 6–23)
CO2: 27 mEq/L (ref 19–32)
Calcium: 9.8 mg/dL (ref 8.4–10.5)
Chloride: 103 mEq/L (ref 96–112)
Creatinine, Ser: 0.87 mg/dL (ref 0.40–1.20)
GFR: 66.36 mL/min (ref >60.00)
Glucose, Bld: 89 mg/dL (ref 70–99)
Potassium: 3.9 mEq/L (ref 3.5–5.1)
Sodium: 142 mEq/L (ref 135–145)
Total Bilirubin: 0.4 mg/dL (ref 0.2–1.2)
Total Protein: 7 g/dL (ref 6.0–8.3)

## 2022-09-27 LAB — HEPATITIS PANEL, ACUTE
Hep A IgM: NONREACTIVE
Hep B C IgM: NONREACTIVE
Hepatitis B Surface Ag: NONREACTIVE
Hepatitis C Ab: NONREACTIVE

## 2022-10-11 DIAGNOSIS — Z1231 Encounter for screening mammogram for malignant neoplasm of breast: Secondary | ICD-10-CM | POA: Diagnosis not present

## 2022-10-11 LAB — HM MAMMOGRAPHY

## 2022-10-12 ENCOUNTER — Encounter: Payer: Self-pay | Admitting: Family Medicine

## 2022-10-12 NOTE — Progress Notes (Signed)
No critical labs need to be addressed urgently. We will discuss labs in detail at upcoming office visit.   

## 2022-10-15 ENCOUNTER — Other Ambulatory Visit (HOSPITAL_COMMUNITY): Payer: Self-pay

## 2022-10-15 ENCOUNTER — Other Ambulatory Visit: Payer: Self-pay | Admitting: Family Medicine

## 2022-10-15 ENCOUNTER — Other Ambulatory Visit: Payer: Self-pay | Admitting: Cardiology

## 2022-10-15 MED ORDER — LOSARTAN POTASSIUM-HCTZ 100-25 MG PO TABS
1.0000 | ORAL_TABLET | Freq: Every day | ORAL | 1 refills | Status: DC
  Filled 2022-10-15: qty 90, 90d supply, fill #0
  Filled 2023-01-16: qty 90, 90d supply, fill #1

## 2022-11-05 DIAGNOSIS — M5416 Radiculopathy, lumbar region: Secondary | ICD-10-CM | POA: Diagnosis not present

## 2022-11-07 ENCOUNTER — Encounter: Payer: Self-pay | Admitting: Cardiology

## 2022-11-07 ENCOUNTER — Ambulatory Visit: Payer: PPO | Attending: Cardiology | Admitting: Cardiology

## 2022-11-07 VITALS — BP 127/78 | HR 73 | Ht 67.0 in | Wt 162.0 lb

## 2022-11-07 DIAGNOSIS — G4719 Other hypersomnia: Secondary | ICD-10-CM | POA: Diagnosis not present

## 2022-11-07 DIAGNOSIS — I493 Ventricular premature depolarization: Secondary | ICD-10-CM | POA: Diagnosis not present

## 2022-11-07 DIAGNOSIS — I10 Essential (primary) hypertension: Secondary | ICD-10-CM

## 2022-11-07 DIAGNOSIS — I471 Supraventricular tachycardia, unspecified: Secondary | ICD-10-CM | POA: Diagnosis not present

## 2022-11-07 NOTE — Patient Instructions (Signed)
Medication Instructions:  Your physician recommends that you continue on your current medications as directed. Please refer to the Current Medication list given to you today.  *If you need a refill on your cardiac medications before your next appointment, please call your pharmacy*   Lab Work: None.  If you have labs (blood work) drawn today and your tests are completely normal, you will receive your results only by: [place] (if you have MyChart) OR A paper copy in the mail If you have any lab test that is abnormal or we need to change your treatment, we will call you to review the results.   Testing/Procedures: Your physician has recommended that you have a sleep study. This test records several body functions during sleep, including: brain activity, eye movement, oxygen and carbon dioxide blood levels, heart rate and rhythm, breathing rate and rhythm, the flow of air through your mouth and nose, snoring, body muscle movements, and chest and belly movement.    Follow-Up: At [facility], you and your health needs are our priority.  As part of our continuing mission to provide you with exceptional heart care, we have created designated Provider Care Teams.  These Care Teams include your primary Cardiologist (physician) and Advanced Practice Providers (APPs -  Physician Assistants and Nurse Practitioners) who all work together to provide you with the care you need, when you need it.  We recommend signing up for the patient portal called "MyChart".  Sign up information is provided on this After Visit Summary.  MyChart is used to connect with patients for Virtual Visits (Telemedicine).  Patients are able to view lab/test results, encounter notes, upcoming appointments, etc.  Non-urgent messages can be sent to your provider as well.   To learn more about what you can do with MyChart, go to [email or web address].    Your next appointment:   1 year(s)  Provider:   Fransico Him, MD

## 2022-11-07 NOTE — Progress Notes (Signed)
Virtual Visit via Video Note   Because of Christina Griffith's co-morbid illnesses, she is at least at moderate risk for complications without adequate follow up.  This format is felt to be most appropriate for this patient at this time.  All issues noted in this document were discussed and addressed.  A limited physical exam was performed with this format.  Please refer to the patient's chart for her consent to telehealth for [facility].   Date:  11/07/2022   ID:  Christina Griffith, DOB 1951/05/18, MRN [id number] The patient was identified using 2 identifiers.  Patient Location: Home Provider Location: Home Office   PCP:  Jinny Sanders, MD   [place] Prov[facility] Cardiologist:  Fransico Him, MD Electrophysiologist:  Vickie Epley, MD     Evaluation Performed:  Follow-Up Visit  Chief Complaint:  PVCs  History of Present Illness:    Christina Griffith is a 72 y.o. female with a hx of asthma, HTN, Palpitations with PVCs and HTN. 2D echo showed low normal LVF with EF 50-55% with G1DD, mildly enlarged RA and LA, mild MR.  Lexiscan myoview done showing no ischemia. She was started on Toprol XL 25mg  daily for suppression of PVCs.  Cardiac MRI showed normal LVF with EF 56% with no LGE, normal RV, mild MR and TR.  2 week ziopatch showed frequent PVCs, trigeminal PVCs and nonsustained wide complex tachycardia up to 5 beats in a row with PVC load 18%.  SHe also had nonsustained atrial tachycardia up to 16 beats in a row as fast ad 235bpm. Her Toprol XL was increased to 50mg  daily and she was referred to EP.  AADT with Flecainide was discussed but her palpitations had improved with Toprol and medical therapy was continued.  Her Toprol was increased further to 75mg  BID due to reoccurrence of palpitations.    She is here today for followup and is doing well.  She denies any chest pain or pressure, SOB, DOE, PND, orthopnea, LE edema, dizziness, palpitations or syncope.  She is compliant with her meds and is tolerating meds with no SE.    Past Medical History:  Diagnosis Date   Arthritis    spinal stenosis--back pain--hx of herniated cervical disk- but no surgery   Asthma    Cold 10/03/12   pt getting over a cold--still has some head congestion, slight cough   Colon polyps    Endometrial ca    H/O: hematuria    benign essential hematuria - per urology work up    History of depression    History of pneumonia    Hypertension    Numbness in right leg    pt. states occassional in right leg and right big toe from disc herniations   PAT (paroxysmal atrial tachycardia)    noted on heart monitor 07/2020   PONV (postoperative nausea and vomiting)    with prior hip replacement   PVC's (premature ventricular contractions)    PVCs, trigeminal PVCs, WCT nonsustained with PVC load 18% by montior 07/2020   Stress incontinence    Wears glasses    Past Surgical History:  Procedure Laterality Date   BREAST SURGERY Right 1980's   breast biopsy   COLONOSCOPY     DENTAL SURGERY  11/2019   EYE SURGERY Bilateral 02/2015   cataracts   HYSTEROSCOPY WITH D & C Left 1/24/204   w/ resection of Endometrial polyps   ROBOTIC ASSISTED TOTAL HYSTERECTOMY WITH BILATERAL SALPINGO OOPHERECTOMY  N/A 10/07/2012   Procedure: ROBOTIC ASSISTED TOTAL HYSTERECTOMY WITH BILATERAL SALPINGO OOPHORECTOMY/POSSIBLE LYMPH NODE BIOPSY;  Surgeon: Imagene Gurney A. Alycia Rossetti, MD;  Location: [facility];  Service: Gynecology;  Laterality: N/A;   TOTAL HIP ARTHROPLASTY     right 2002 left 2004   TOTAL HIP REVISION Right 05/09/2015   Procedure: TOTAL HIP REVISION;  Surgeon: Frederik Pear, MD;  Location: [facility];  Service: Orthopedics;  Laterality: Right;     Current Meds  Medication Sig   albuterol (PROVENTIL HFA;VENTOLIN HFA) 108 (90 Base) MCG/ACT inhaler Inhale 2 puffs into the lungs every 6 (six) hours as needed for wheezing or shortness of breath.   atorvastatin (LIPITOR) 10 MG tablet Take 1 tablet (10 mg total)  by mouth daily.   Calcium Carb-Cholecalciferol 600-800 MG-UNIT TABS Take 1 tablet by mouth daily.   fexofenadine (ALLEGRA) 180 MG tablet Take 180 mg by mouth daily as needed for allergies or rhinitis.   losartan-hydrochlorothiazide (HYZAAR) 100-25 MG tablet Take 1 tablet by mouth daily.   magnesium gluconate (MAGONATE) 500 MG tablet Take 500 mg by mouth daily.   meloxicam (MOBIC) 15 MG tablet Take 1 tablet by mouth once a day   metoprolol succinate (TOPROL-XL) 50 MG 24 hr tablet Take 1.5 tablets (75 mg total) by mouth in the morning and at bedtime. Take with or immediately following a meal.   Multiple Vitamin (MULTIVITAMIN) tablet Take 1 tablet by mouth daily.   PFIZER-BIONT COVID-19 VAC-TRIS SUSP injection    Polyethylene Glycol 3350 (MIRALAX PO) Take 17 g by mouth as needed.    traMADol (ULTRAM) 50 MG tablet Take 1 tablet (50 mg total) by mouth every 6 (six) hours as needed for pain.   Triamcinolone Acetonide (NASACORT ALLERGY 24HR NA) Place 1 spray into the nose as needed.    venlafaxine XR (EFFEXOR-XR) 75 MG 24 hr capsule Take 1 capsule (75 mg total) by mouth daily.     Allergies:   Patient has no known allergies.   Social History   Tobacco Use   Smoking status: Never   Smokeless tobacco: Never  Vaping Use   Vaping Use: Never used  Substance Use Topics   Alcohol use: Yes    Alcohol/week: 3.0 standard drinks of alcohol    Types: 3 Glasses of wine per week    Comment: 3 x a week   Drug use: No     Family Hx: The patient's family history includes Breast cancer in her cousin; Cancer in her father and maternal grandmother; Hyperlipidemia in her father; Hypertension in her mother.  ROS:   Please see the history of present illness.     All other systems reviewed and are negative.   Prior CV studies:   The following studies were reviewed today:  none  Labs/Other Tests and Data Reviewed:    EKG:  No ECG reviewed.  Recent Labs: 06/06/2022: Hemoglobin 12.4; Platelets 281.0;  TSH 0.74 09/26/2022: ALT 34; BUN 21; Creatinine, Ser 0.87; Potassium 3.9; Sodium 142   Recent Lipid Panel Lab Results  Component Value Date/Time   CHOL 193 09/26/2022 07:41 AM   TRIG 186.0 (H) 09/26/2022 07:41 AM   HDL 57.50 09/26/2022 07:41 AM   CHOLHDL 3 09/26/2022 07:41 AM   LDLCALC 98 09/26/2022 07:41 AM   LDLDIRECT 104.0 05/18/2022 07:37 AM    Wt Readings from Last 3 Encounters:  11/07/22 162 lb (73.5 kg)  06/06/22 168 lb (76.2 kg)  12/26/21 169 lb 3.2 oz (76.7 kg)     Risk  Assessment/Calculations:      STOP-Bang Score:  3      Objective:    Vital Signs:  BP 127/78   Pulse 73   Ht 5\' 7"  (1.702 m)   Wt 162 lb (73.5 kg)   BMI 25.37 kg/m   Well nourished, well developed female in no acute distress. Well appearing, alert and conversant, regular work of breathing,  good skin color  Eyes- anicteric mouth- oral mucosa is pink  neuro- grossly intact skin- no apparent rash or lesions or cyanosis ASSESSMENT & PLAN:    Palpitations/PVCs -2 week ziopatch  showed frequent PVCs with PVC load 18% with NSVT and trigeminal PVCs -seen by EP and discussed AADT vs.PVC ablation but her palpitations had significantly improved on higher dose of BB -cardiac MRI showed normal LVF and RVF with no LGE  -TSH and Hbg were normal  -Lexiscan myoview with no ischemia -Her palpitations have been very well-controlled on Toprol-XL 75 mg twice daily -when she saw Dr. Quentin Ore last year she was complaining of lack of energy during the day so he recommended that she decrease her Toprol to 50mg  qam and 75mg  qpm with much improvement in her symptoms. -she has not had any arrhythmias on the lower dose of Toprol and will continue on current dosing   2.  Paroxysmal atrial tachycardia -palpitations have significantly improved on Toprol XL  -she will continue on Toprol twice daily   3.  HTN -Her BP is controlled  -Continue prescription drug management with Hyzaar 100-25 mg daily and Toprol-XL 50mg  qam  and 75mg  qpm with as needed refills -I have personally reviewed and interpreted outside labs performed by patient's PCP which showed serum creatinine 0.87 and potassium 3.9 on 09/26/2022  4.  Daytime sleepiness -? Related to BB and has improved with lower dose of Toprol during the day but still occasionally will take a nap -Her Stop Bang Score is 3 -I will get an Itamar HST to rule out OSA   Time:   Today, I have spent 15 minutes with the patient with telehealth technology discussing the above problems.     Medication Adjustments/Labs and Tests Ordered: Current medicines are reviewed at length with the patient today.  Concerns regarding medicines are outlined above.   Tests Ordered: No orders of the defined types were placed in this encounter.   Medication Changes: No orders of the defined types were placed in this encounter.   Follow Up:  In Person in 1 year(s)  Signed, Fransico Him, MD  11/07/2022 9:15 AM    [place]

## 2022-11-07 NOTE — Addendum Note (Signed)
Addended by: Joni Reining on: 11/07/2022 09:25 AM   Modules accepted: Orders

## 2022-11-12 ENCOUNTER — Telehealth: Payer: Self-pay | Admitting: *Deleted

## 2022-11-12 NOTE — Telephone Encounter (Signed)
Prior Authorization for ITAMAR sent to [facility] via web portal. Tracking Number .  READY-APPROVED-NO PA REQ 

## 2022-11-12 NOTE — Telephone Encounter (Signed)
I called the pt and she will come by the office 4/11 @ 11:30 to set up Itamar study. Will give pt PIN# [phone] as she has been approved. Stop bang score is 3.

## 2022-11-14 DIAGNOSIS — M5416 Radiculopathy, lumbar region: Secondary | ICD-10-CM | POA: Diagnosis not present

## 2022-11-15 ENCOUNTER — Telehealth: Payer: Self-pay | Admitting: *Deleted

## 2022-11-15 NOTE — Telephone Encounter (Signed)
-----   Message from Tarri Fuller, CMA sent at 11/12/2022  6:23 PM EDT ----- Regarding: Donnie Coffin SET UP; PT APPROVED ALREADY I called the pt and she will come by the office 4/11 @ 11:30 to set up Itamar study. Will give pt PIN# [phone] as she has been approved. Stop bang score is 3.

## 2022-11-15 NOTE — Telephone Encounter (Signed)
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 Mayford Knife. Pt has been given PIN# [id number] as insurance been approved. Pt will do study by early next week. Pt agreeable to signed waiver and made aware she will be called with results once completed and reviewed by Dr. Mayford Knife.   Called and made the patient aware that she may proceed with the [facility] Sleep Study. PIN # provided to the patient. Patient made aware that she will be contacted after the test has been read with the results and any recommendations. Patient verbalized understanding and thanked me for the call.

## 2022-11-16 ENCOUNTER — Encounter (INDEPENDENT_AMBULATORY_CARE_PROVIDER_SITE_OTHER): Payer: PPO | Admitting: Cardiology

## 2022-11-16 DIAGNOSIS — G4733 Obstructive sleep apnea (adult) (pediatric): Secondary | ICD-10-CM | POA: Diagnosis not present

## 2022-11-18 ENCOUNTER — Ambulatory Visit: Payer: PPO | Attending: Cardiology

## 2022-11-18 DIAGNOSIS — G4719 Other hypersomnia: Secondary | ICD-10-CM

## 2022-11-18 NOTE — Procedures (Signed)
SLEEP STUDY REPORT Patient Information Study Date: 11/16/2022 Patient Name: Christina Griffith Patient ID: [id number] Birth Date: [birth year] Age: 72 Gender: Female BMI: 25.6 (W=163 lb, H=5' 7'') Stopbang: 3 Referring Physician: Armanda Magic, MD  TEST DESCRIPTION: Home sleep apnea testing was completed using the WatchPat, a Type 1 device, utilizing peripheral arterial tonometry (PAT), chest movement, actigraphy, pulse oximetry, pulse rate, body position and snore. AHI was calculated with apnea and hypopnea using valid sleep time as the denominator. RDI includes apneas, hypopneas, and RERAs. The data acquired and the scoring of sleep and all associated events were performed in accordance with the recommended standards and specifications as outlined in the AASM Manual for the Scoring of Sleep and Associated Events 2.2.0 (2015).   FINDINGS:   1. Mild Obstructive Sleep Apnea with AHI 10.2/hr.   2. No Central Sleep Apnea with pAHIc 2.2/hr.   3. Oxygen desaturations as low as 85%.   4. Mild snoring was present. O2 sats were < 88% for 0.6 min.   5. Total sleep time was 6 hrs and 18 min.   6. 12.8% of total sleep time was spent in REM sleep.   7. Shortened sleep onset latency at 6 min.   8. Shortened REM sleep onset latency at 35 min.   9. Total awakenings were 9.  10. Arrhythmia detection: None.  DIAGNOSIS: Mild Obstructive Sleep Apnea (G47.33)  RECOMMENDATIONS:   1.  Clinical correlation of these findings is necessary.  The decision to treat obstructive sleep apnea (OSA) is usually based on the presence of apnea symptoms or the presence of associated medical conditions such as Hypertension, Congestive Heart Failure, Atrial Fibrillation or Obesity.  The most common symptoms of OSA are snoring, gasping for breath while sleeping, daytime sleepiness and fatigue.   2.  Initiating apnea therapy is recommended given the presence of symptoms and/or associated conditions. Recommend proceeding  with one of the following:     a.  Auto-CPAP therapy with a pressure range of 5-20cm H2O.     b.  An oral appliance (OA) that can be obtained from certain dentists with expertise in sleep medicine.  These are primarily of use in non-obese patients with mild and moderate disease.     c.  An ENT consultation which may be useful to look for specific causes of obstruction and possible treatment options.     d.  If patient is intolerant to PAP therapy, consider referral to ENT for evaluation for hypoglossal nerve stimulator.   3.  Close follow-up is necessary to ensure success with CPAP or oral appliance therapy for maximum benefit.  4.  A follow-up oximetry study on CPAP is recommended to assess the adequacy of therapy and determine the need for supplemental oxygen or the potential need for Bi-level therapy.  An arterial blood gas to determine the adequacy of baseline ventilation and oxygenation should also be considered.  5.  Healthy sleep recommendations include:  adequate nightly sleep (normal 7-9 hrs/night), avoidance of caffeine after noon and alcohol near bedtime, and maintaining a sleep environment that is cool, dark and quiet.  6.  Weight loss for overweight patients is recommended.  Even modest amounts of weight loss can significantly improve the severity of sleep apnea.  7.  Snoring recommendations include:  weight loss where appropriate, side sleeping, and avoidance of alcohol before bed.  8.  Operation of motor vehicle should be avoided when sleepy.  Signature: Armanda Magic, MD; [facility]; Diplomat, American Board of Sleep Medicine Electronically Signed:  11/18/2022 5:44:02 PM

## 2022-11-19 NOTE — Telephone Encounter (Signed)
Prior Authorization for ITAMAR sent to [facility] via web portal. Tracking Number .  READY-APPROVED-NO PA REQ 

## 2022-11-20 NOTE — Telephone Encounter (Signed)
I called pt in error. Pt was already approved and had been given PIN# at set up. I apologized for my call.

## 2022-11-26 DIAGNOSIS — Z9842 Cataract extraction status, left eye: Secondary | ICD-10-CM | POA: Diagnosis not present

## 2022-11-26 DIAGNOSIS — H52223 Regular astigmatism, bilateral: Secondary | ICD-10-CM | POA: Diagnosis not present

## 2022-11-26 DIAGNOSIS — H17812 Minor opacity of cornea, left eye: Secondary | ICD-10-CM | POA: Diagnosis not present

## 2022-11-26 DIAGNOSIS — Z9841 Cataract extraction status, right eye: Secondary | ICD-10-CM | POA: Diagnosis not present

## 2022-12-03 ENCOUNTER — Other Ambulatory Visit: Payer: Self-pay | Admitting: Family Medicine

## 2022-12-03 ENCOUNTER — Telehealth: Payer: Self-pay | Admitting: *Deleted

## 2022-12-03 ENCOUNTER — Other Ambulatory Visit (HOSPITAL_COMMUNITY): Payer: Self-pay

## 2022-12-03 DIAGNOSIS — G4719 Other hypersomnia: Secondary | ICD-10-CM

## 2022-12-03 DIAGNOSIS — I10 Essential (primary) hypertension: Secondary | ICD-10-CM

## 2022-12-03 DIAGNOSIS — G4733 Obstructive sleep apnea (adult) (pediatric): Secondary | ICD-10-CM

## 2022-12-03 DIAGNOSIS — M5416 Radiculopathy, lumbar region: Secondary | ICD-10-CM | POA: Diagnosis not present

## 2022-12-03 MED ORDER — ATORVASTATIN CALCIUM 10 MG PO TABS
10.0000 mg | ORAL_TABLET | Freq: Every day | ORAL | 1 refills | Status: DC
  Filled 2022-12-03: qty 90, 90d supply, fill #0
  Filled 2023-03-07: qty 90, 90d supply, fill #1

## 2022-12-03 NOTE — Telephone Encounter (Signed)
The patient has been notified of the result. Left detailed message on voicemail and informed patient to call back..Natonya Finstad Green, CMA   

## 2022-12-03 NOTE — Telephone Encounter (Signed)
-----   Message from Gaynelle Cage, [place] sent at 11/20/2022  9:25 AM EDT -----  ----- Message ----- From: Quintella Reichert, MD Sent: 11/18/2022   5:45 PM EDT To: [facility] Sleep Studies  Please let patient know that they have sleep apnea and recommend treating with CPAP.  Please order an auto CPAP from 4-15cm H2O with heated humidity and mask of choice.  Order overnight pulse ox on CPAP.  Followup with me in 6 weeks.

## 2022-12-04 NOTE — Telephone Encounter (Signed)
Return  Call: The patient has been notified of the result and verbalized understanding.  All questions (if any) were answered. [name], CMA 12/04/2022 11:18 AM    Upon patient request DME selection is [facility]. Patient understands he will be contacted by [facility] to set up his cpap. Patient understands to call if [facility] does not contact him with new setup in a timely manner. Patient understands they will be called once confirmation has been received from [facility]/ that they have received their new machine to schedule 10 week follow up appointment.   [facility] notified of new cpap order  Please add to airview Patient was grateful for the call and thanked me.

## 2022-12-04 NOTE — Addendum Note (Signed)
Addended by: Reesa Chew on: 12/04/2022 11:38 AM   Modules accepted: Orders

## 2022-12-13 DIAGNOSIS — M5416 Radiculopathy, lumbar region: Secondary | ICD-10-CM | POA: Diagnosis not present

## 2023-01-03 ENCOUNTER — Other Ambulatory Visit: Payer: Self-pay | Admitting: Cardiology

## 2023-01-03 ENCOUNTER — Other Ambulatory Visit (HOSPITAL_COMMUNITY): Payer: Self-pay

## 2023-01-03 MED ORDER — METOPROLOL SUCCINATE ER 50 MG PO TB24
75.0000 mg | ORAL_TABLET | Freq: Two times a day (BID) | ORAL | 0 refills | Status: DC
  Filled 2023-01-03: qty 270, 90d supply, fill #0

## 2023-01-04 ENCOUNTER — Other Ambulatory Visit (HOSPITAL_COMMUNITY): Payer: Self-pay

## 2023-01-04 DIAGNOSIS — M5416 Radiculopathy, lumbar region: Secondary | ICD-10-CM | POA: Diagnosis not present

## 2023-01-04 DIAGNOSIS — G4733 Obstructive sleep apnea (adult) (pediatric): Secondary | ICD-10-CM | POA: Diagnosis not present

## 2023-01-04 MED ORDER — GABAPENTIN 300 MG PO CAPS
300.0000 mg | ORAL_CAPSULE | Freq: Every day | ORAL | 0 refills | Status: DC
  Filled 2023-01-04: qty 30, 30d supply, fill #0

## 2023-01-12 DIAGNOSIS — M5416 Radiculopathy, lumbar region: Secondary | ICD-10-CM | POA: Diagnosis not present

## 2023-01-16 ENCOUNTER — Other Ambulatory Visit (HOSPITAL_COMMUNITY): Payer: Self-pay

## 2023-01-21 ENCOUNTER — Other Ambulatory Visit: Payer: Self-pay | Admitting: Orthopedic Surgery

## 2023-01-21 DIAGNOSIS — G4733 Obstructive sleep apnea (adult) (pediatric): Secondary | ICD-10-CM | POA: Diagnosis not present

## 2023-01-31 ENCOUNTER — Encounter: Payer: Self-pay | Admitting: Cardiology

## 2023-01-31 ENCOUNTER — Telehealth: Payer: Self-pay | Admitting: *Deleted

## 2023-01-31 NOTE — Telephone Encounter (Signed)
   Name: TAMIKKA PILGER  DOB: [birth year]  MRN: [id number]  Primary Cardiologist: Armanda Magic, MD  Chart reviewed as part of pre-operative protocol coverage. Because of Chanette Demo Gugel's past medical history and time since last visit, she will require a follow-up in-office visit in order to better assess preoperative cardiovascular risk.  Pre-op covering staff: - Please schedule appointment and call patient to inform them. If patient already had an upcoming appointment within acceptable timeframe, please add "pre-op clearance" to the appointment notes so provider is aware. - Please contact requesting surgeon's office via preferred method (i.e, phone, fax) to inform them of need for appointment prior to surgery.  No medications to hold.  Napoleon Form, Leodis Rains, NP  01/31/2023, 4:54 PM

## 2023-01-31 NOTE — Telephone Encounter (Signed)
   Pre-operative Risk Assessment    Patient Name: Christina Griffith  DOB: 07-20-1951 MRN: [id number]      Request for Surgical Clearance    Procedure:   LEFT-SIDED LUMBAR 2- LUMBAR 3, LUMBAR 3- LUMBAR 4, LUMBAR 4- LUMBAR 5 TRANSFORAMINAL LUMBAR INTERBODY FUSION AND DECOMPRESSION WITH INSTRUMENTATION AND ALLOGRAFT  Date of Surgery:  Clearance 02/27/23                                 Surgeon:  DR. MARK DUMONSKI Surgeon's Group or Practice Name:  [facility] Phone number:  [phone] ATTN: Tobey Bride Fax number:  [phone]   Type of Clearance Requested:   - Medical ; NO MEDICATIONS LISTED NEEDING TO BE HELD   Type of Anesthesia:  General    Additional requests/questions:    Elpidio Anis   01/31/2023, 4:50 PM

## 2023-01-31 NOTE — Telephone Encounter (Signed)
I s/w the pt and she is agreeable to in office appt for pre op clearance. Pt has been scheduled to see Tereso Newcomer, [facility] 02/06/23 @ 8:50. I will update all parties involved.

## 2023-02-03 DIAGNOSIS — G4733 Obstructive sleep apnea (adult) (pediatric): Secondary | ICD-10-CM | POA: Diagnosis not present

## 2023-02-05 DIAGNOSIS — Z01810 Encounter for preprocedural cardiovascular examination: Secondary | ICD-10-CM | POA: Insufficient documentation

## 2023-02-05 NOTE — Progress Notes (Unsigned)
Cardiology Office Note:    Date:  02/06/2023  ID:  Christina Griffith, DOB 1950-11-21, MRN [id number] PCP: Excell Seltzer, MD  [facility] Providers Cardiologist:  Armanda Magic, MD Electrophysiologist:  Lanier Prude, MD       Patient Profile:      Asthma Hypertension Palpitations PVCs Monitor 06/2020: PVCs 18%, nonsustained atrial tachycardia TTE 06/10/2020: EF 50-55, no RWMA, G1 DD, normal RVSF, mild BAE, mild MR, RAP 3 Cardiac MRI 07/14/2020: EF 56, no RWMA, no LGE, normal ECV (29%), normal RVSF, normal aortic root, ascending aorta, pulmonary artery size, mild MR, trivial TR Monitor 02/2021: NSR, runs of SVT (longest 12.9 seconds) EP eval 2023 - AAD Rx vs Ablation >> Sx's improved on BB - No ?  Nonsustained atrial tachycardia  beta-blocker Rx  Myoview 06/24/2020: Normal perfusion, EF 60, low risk      History of Present Illness:   Christina Griffith is a 72 y.o. female who returns for surgical clearance.  She was last seen by Dr. Mayford Knife via video visit/3/24.  She needs to undergo lumbar spine surgery 02/27/2023 with Dr. Yevette Edwards under general anesthesia.  She is here alone.  She has not had chest discomfort, shortness of breath, syncope, orthopnea, leg edema.  She is somewhat limited by her back.  However, she can still walk up a flight of stairs or do most household activities.  Review of Systems  Musculoskeletal:  Positive for back pain.   see the HPI    Studies Reviewed:   EKG Interpretation Date/Time:  Wednesday February 06 2023 08:50:20 EDT Ventricular Rate:  80 PR Interval:  174 QRS Duration:  84 QT Interval:  376 QTC Calculation: 433 R Axis:   6  Text Interpretation: Normal sinus rhythm Low voltage QRS  No change when compared to prior ECG Confirmed by Tereso Newcomer [phone]) on 02/06/2023 9:04:46 AM    Risk Assessment/Calculations:         STOP-Bang Score:  3      Physical Exam:   VS:  BP 130/60   Pulse 80   Ht 5\' 7"  (1.702 m)   Wt 166 lb 9.6 oz (75.6  kg)   SpO2 94%   BMI 26.09 kg/m    Wt Readings from Last 3 Encounters:  02/06/23 166 lb 9.6 oz (75.6 kg)  11/07/22 162 lb (73.5 kg)  06/06/22 168 lb (76.2 kg)    Constitutional:      Appearance: Healthy appearance. Not in distress.  Neck:     Vascular: JVD normal.  Pulmonary:     Breath sounds: Normal breath sounds. No wheezing. No rales.  Cardiovascular:     Normal rate. Regular rhythm.     Murmurs: There is no murmur.  Edema:    Peripheral edema absent.  Abdominal:     Palpations: Abdomen is soft.      ASSESSMENT AND PLAN:   Preoperative cardiovascular examination Ms. Rakes's perioperative risk of a major cardiac event is 0.4% according to the Revised Cardiac Risk Index (RCRI).  Therefore, she is at low risk for perioperative complications.   Her functional capacity is good at 4.3 METs according to the Duke Activity Status Index (DASI). Recommendations: According to ACC/AHA guidelines, no further cardiovascular testing needed.  The patient may proceed to surgery at acceptable risk.    PVC's (premature ventricular contractions) Overall well-controlled on beta-blocker therapy.  She did decrease metoprolol succinate to 50 mg in the morning and 75 mg in the evening secondary to  fatigue.  She is tolerating this well.  Continue current therapy.  Follow-up with Dr. Lalla Brothers or EP APP in 6 months.  Follow-up with Dr. Mayford Knife in 1 year.  Hypertension Blood pressure is well-controlled.  Continue losartan/HCTZ 100/25 mg daily, metoprolol succinate 50 mg in the morning, 75 mg in the evening.    Dispo:  Return in about 6 months (around 08/09/2023) for Routine Follow Up w Dr. Lalla Brothers or EP APP.  Signed, Tereso Newcomer, PA-C

## 2023-02-06 ENCOUNTER — Encounter: Payer: Self-pay | Admitting: Physician Assistant

## 2023-02-06 ENCOUNTER — Ambulatory Visit: Payer: PPO | Attending: Physician Assistant | Admitting: Physician Assistant

## 2023-02-06 VITALS — BP 130/60 | HR 80 | Ht 67.0 in | Wt 166.6 lb

## 2023-02-06 DIAGNOSIS — I493 Ventricular premature depolarization: Secondary | ICD-10-CM

## 2023-02-06 DIAGNOSIS — Z01810 Encounter for preprocedural cardiovascular examination: Secondary | ICD-10-CM | POA: Diagnosis not present

## 2023-02-06 DIAGNOSIS — I10 Essential (primary) hypertension: Secondary | ICD-10-CM

## 2023-02-06 NOTE — Telephone Encounter (Signed)
Note faxed to surgeon Tereso Newcomer, PA-C    02/06/2023 5:39 PM

## 2023-02-06 NOTE — Patient Instructions (Signed)
Medication Instructions:  Your physician recommends that you continue on your current medications as directed. Please refer to the Current Medication list given to you today.  *If you need a refill on your cardiac medications before your next appointment, please call your pharmacy*   Lab Work: None ordered  If you have labs (blood work) drawn today and your tests are completely normal, you will receive your results only by: MyChart Message (if you have MyChart) OR A paper copy in the mail If you have any lab test that is abnormal or we need to change your treatment, we will call you to review the results.   Testing/Procedures: None ordered   Follow-Up: At [facility], you and your health needs are our priority.  As part of our continuing mission to provide you with exceptional heart care, we have created designated Provider Care Teams.  These Care Teams include your primary Cardiologist (physician) and Advanced Practice Providers (APPs -  Physician Assistants and Nurse Practitioners) who all work together to provide you with the care you need, when you need it.  We recommend signing up for the patient portal called "MyChart".  Sign up information is provided on this After Visit Summary.  MyChart is used to connect with patients for Virtual Visits (Telemedicine).  Patients are able to view lab/test results, encounter notes, upcoming appointments, etc.  Non-urgent messages can be sent to your provider as well.   To learn more about what you can do with MyChart, go to [email or web address].    Your next appointment:   6 month(s)  Provider:   You will see one of the following Advanced Practice Providers on your designated Care Team:   Casimiro Needle "Mardelle Matte" Lanna Poche, PA-C       1 YEAR WITH DR. Mayford Knife Other Instructions

## 2023-02-06 NOTE — Assessment & Plan Note (Addendum)
Ms. Kitchens perioperative risk of a major cardiac event is 0.4% according to the Revised Cardiac Risk Index (RCRI).  Therefore, she is at low risk for perioperative complications.   Her functional capacity is good at 4.3 METs according to the Duke Activity Status Index (DASI). Recommendations: According to ACC/AHA guidelines, no further cardiovascular testing needed.  The patient may proceed to surgery at acceptable risk.

## 2023-02-06 NOTE — Assessment & Plan Note (Signed)
Overall well-controlled on beta-blocker therapy.  She did decrease metoprolol succinate to 50 mg in the morning and 75 mg in the evening secondary to fatigue.  She is tolerating this well.  Continue current therapy.  Follow-up with Dr. Lalla Brothers or EP APP in 6 months.  Follow-up with Dr. Mayford Knife in 1 year.

## 2023-02-06 NOTE — Assessment & Plan Note (Signed)
Blood pressure is well-controlled.  Continue losartan/HCTZ 100/25 mg daily, metoprolol succinate 50 mg in the morning, 75 mg in the evening.

## 2023-02-11 ENCOUNTER — Other Ambulatory Visit (HOSPITAL_COMMUNITY): Payer: Self-pay

## 2023-02-11 ENCOUNTER — Other Ambulatory Visit: Payer: Self-pay | Admitting: Family Medicine

## 2023-02-11 MED ORDER — VENLAFAXINE HCL ER 75 MG PO CP24
75.0000 mg | ORAL_CAPSULE | Freq: Every day | ORAL | 0 refills | Status: DC
  Filled 2023-02-11: qty 90, 90d supply, fill #0

## 2023-02-12 ENCOUNTER — Other Ambulatory Visit (HOSPITAL_COMMUNITY): Payer: Self-pay

## 2023-02-18 NOTE — Progress Notes (Signed)
Surgical Instructions    Your procedure is scheduled on Wednesday February 27, 2023.  Report to [facility] Main Entrance "A" at 5:30 A.M., then check in with the Admitting office.  Call this number if you have problems the morning of surgery:  [phone]   If you have any questions prior to your surgery date call [phone]: Open Monday-Friday 8am-4pm If you experience any cold or flu symptoms such as cough, fever, chills, shortness of breath, etc. between now and your scheduled surgery, please notify us at the above number     Remember:  Do not eat after midnight the night before your surgery  You may drink clear liquids until 4:30 the morning of your surgery.   Clear liquids allowed are: Water, Non-Citrus Juices (without pulp), Carbonated Beverages, Clear Tea, Black Coffee ONLY (NO MILK, CREAM OR POWDERED CREAMER of any kind), and Gatorade.    Take these medicines the morning of surgery with A SIP OF WATER:   metoprolol succinate (TOPROL-XL)   If needed:   albuterol inhaler: please bring with you the day of surgery. fexofenadine (ALLEGRA)  NASACORT ALLERGY   As of today, STOP taking any Aspirin (unless otherwise instructed by your surgeon) Aleve, Naproxen, Ibuprofen, Motrin, Advil, Goody's, BC's, all herbal medications, fish oil, and all vitamins. This includes meloxicam (MOBIC).   Special instructions:    Oral Hygiene is also important to reduce your risk of infection.  Remember - BRUSH YOUR TEETH THE MORNING OF SURGERY WITH YOUR REGULAR TOOTHPASTE    Pre-operative 5 CHG Bath Instructions   You can play a key role in reducing the risk of infection after surgery. Your skin needs to be as free of germs as possible. You can reduce the number of germs on your skin by washing with CHG (chlorhexidine gluconate) soap before surgery. CHG is an antiseptic soap that kills germs and continues to kill germs even after washing.   DO NOT use if you have an allergy to chlorhexidine/CHG  or antibacterial soaps. If your skin becomes reddened or irritated, stop using the CHG and notify one of our RNs at [phone].   Please shower with the CHG soap starting 4 days before surgery using the following schedule:     Please keep in mind the following:  DO NOT shave, including legs and underarms, starting the day of your first shower.   You may shave your face at any point before/day of surgery.  Place clean sheets on your bed the day you start using CHG soap. Use a clean washcloth (not used since being washed) for each shower. DO NOT sleep with pets once you start using the CHG.   CHG Shower Instructions:  If you choose to wash your hair and private area, wash first with your normal shampoo/soap.  After you use shampoo/soap, rinse your hair and body thoroughly to remove shampoo/soap residue.  Turn the water OFF and apply about 3 tablespoons (45 ml) of CHG soap to a CLEAN washcloth.  Apply CHG soap ONLY FROM YOUR NECK DOWN TO YOUR TOES (washing for 3-5 minutes)  DO NOT use CHG soap on face, private areas, open wounds, or sores.  Pay special attention to the area where your surgery is being performed.  If you are having back surgery, having someone wash your back for you may be helpful. Wait 2 minutes after CHG soap is applied, then you may rinse off the CHG soap.  Pat dry with a clean towel  Put on clean clothes/pajamas  If you choose to wear lotion, please use ONLY the CHG-compatible lotions on the back of this paper.     Additional instructions for the day of surgery: DO NOT APPLY any lotions, deodorants, cologne, or perfumes.   Put on clean/comfortable clothes.  Brush your teeth.  Ask your nurse before applying any prescription medications to the skin.      CHG Compatible Lotions   Aveeno Moisturizing lotion  Cetaphil Moisturizing Cream  Cetaphil Moisturizing Lotion  Clairol Herbal Essence Moisturizing Lotion, Dry Skin  Clairol Herbal Essence Moisturizing  Lotion, Extra Dry Skin  Clairol Herbal Essence Moisturizing Lotion, Normal Skin  Curel Age Defying Therapeutic Moisturizing Lotion with Alpha Hydroxy  Curel Extreme Care Body Lotion  Curel Soothing Hands Moisturizing Hand Lotion  Curel Therapeutic Moisturizing Cream, Fragrance-Free  Curel Therapeutic Moisturizing Lotion, Fragrance-Free  Curel Therapeutic Moisturizing Lotion, Original Formula  Eucerin Daily Replenishing Lotion  Eucerin Dry Skin Therapy Plus Alpha Hydroxy Crme  Eucerin Dry Skin Therapy Plus Alpha Hydroxy Lotion  Eucerin Original Crme  Eucerin Original Lotion  Eucerin Plus Crme Eucerin Plus Lotion  Eucerin TriLipid Replenishing Lotion  Keri Anti-Bacterial Hand Lotion  Keri Deep Conditioning Original Lotion Dry Skin Formula Softly Scented  Keri Deep Conditioning Original Lotion, Fragrance Free Sensitive Skin Formula  Keri Lotion Fast Absorbing Fragrance Free Sensitive Skin Formula  Keri Lotion Fast Absorbing Softly Scented Dry Skin Formula  Keri Original Lotion  Keri Skin Renewal Lotion Keri Silky Smooth Lotion  Keri Silky Smooth Sensitive Skin Lotion  Nivea Body Creamy Conditioning Oil  Nivea Body Extra Enriched Lotion  Nivea Body Original Lotion  Nivea Body Sheer Moisturizing Lotion Nivea Crme  Nivea Skin Firming Lotion  NutraDerm 30 Skin Lotion  NutraDerm Skin Lotion  NutraDerm Therapeutic Skin Cream  NutraDerm Therapeutic Skin Lotion  ProShield Protective Hand Cream  Provon moisturizing lotion   [facility] is not responsible for any belongings or valuables.    Do NOT Smoke (Tobacco/Vaping)  24 hours prior to your procedure  If you use a CPAP at night, you may bring your mask for your overnight stay.   Contacts, glasses, hearing aids, dentures or partials may not be worn into surgery, please bring cases for these belongings   For patients admitted to the hospital, discharge time will be determined by your treatment team.   Patients discharged the day  of surgery will not be allowed to drive home, and someone needs to stay with them for 24 hours.   SURGICAL WAITING ROOM VISITATION Patients having surgery or a procedure may have no more than 2 support people in the waiting area - these visitors may rotate.   Children under the age of 25 must have an adult with them who is not the patient. If the patient needs to stay at the hospital during part of their recovery, the visitor guidelines for inpatient rooms apply. Pre-op nurse will coordinate an appropriate time for 1 support person to accompany patient in pre-op.  This support person may not rotate.   Please refer to [email or web address] for the visitor guidelines for Inpatients (after your surgery is over and you are in a regular room).   If you received a COVID test during your pre-op visit, it is requested that you wear a mask when out in public, stay away from anyone that may not be feeling well, and notify your surgeon if you develop symptoms. If you have been in contact with anyone that has tested positive in the last 10 days, please  notify your surgeon.    Please read over the following fact sheets that you were given.

## 2023-02-19 ENCOUNTER — Encounter (HOSPITAL_COMMUNITY): Payer: Self-pay

## 2023-02-19 ENCOUNTER — Other Ambulatory Visit: Payer: Self-pay

## 2023-02-19 ENCOUNTER — Encounter (HOSPITAL_COMMUNITY)
Admission: RE | Admit: 2023-02-19 | Discharge: 2023-02-19 | Disposition: A | Discharge status: Home / Self Care | Payer: PPO | Source: Ambulatory Visit | Attending: Orthopedic Surgery | Admitting: Orthopedic Surgery

## 2023-02-19 VITALS — BP 113/102 | HR 88 | Temp 97.9°F | Resp 17 | Ht 67.0 in | Wt 166.0 lb

## 2023-02-19 DIAGNOSIS — I4719 Other supraventricular tachycardia: Secondary | ICD-10-CM | POA: Diagnosis not present

## 2023-02-19 DIAGNOSIS — I2510 Atherosclerotic heart disease of native coronary artery without angina pectoris: Secondary | ICD-10-CM | POA: Insufficient documentation

## 2023-02-19 DIAGNOSIS — J45909 Unspecified asthma, uncomplicated: Secondary | ICD-10-CM | POA: Diagnosis not present

## 2023-02-19 DIAGNOSIS — Z8542 Personal history of malignant neoplasm of other parts of uterus: Secondary | ICD-10-CM | POA: Diagnosis not present

## 2023-02-19 DIAGNOSIS — M4800 Spinal stenosis, site unspecified: Secondary | ICD-10-CM | POA: Diagnosis not present

## 2023-02-19 DIAGNOSIS — Z01818 Encounter for other preprocedural examination: Secondary | ICD-10-CM

## 2023-02-19 DIAGNOSIS — I10 Essential (primary) hypertension: Secondary | ICD-10-CM | POA: Insufficient documentation

## 2023-02-19 DIAGNOSIS — Z01812 Encounter for preprocedural laboratory examination: Secondary | ICD-10-CM | POA: Diagnosis not present

## 2023-02-19 DIAGNOSIS — G4733 Obstructive sleep apnea (adult) (pediatric): Secondary | ICD-10-CM | POA: Insufficient documentation

## 2023-02-19 DIAGNOSIS — E7800 Pure hypercholesterolemia, unspecified: Secondary | ICD-10-CM | POA: Diagnosis not present

## 2023-02-19 HISTORY — DX: Pure hypercholesterolemia, unspecified: E78.00

## 2023-02-19 LAB — BASIC METABOLIC PANEL
Anion gap: 7 (ref 5–15)
BUN: 19 mg/dL (ref 8–23)
CO2: 29 mmol/L (ref 22–32)
Calcium: 9.7 mg/dL (ref 8.9–10.3)
Chloride: 100 mmol/L (ref 98–111)
Creatinine, Ser: 0.75 mg/dL (ref 0.44–1.00)
GFR, Estimated: >60 mL/min (ref >60)
Glucose, Bld: 97 mg/dL (ref 70–99)
Potassium: 3.5 mmol/L (ref 3.5–5.1)
Sodium: 136 mmol/L (ref 135–145)

## 2023-02-19 LAB — TYPE AND SCREEN
ABO/RH(D): O POS
Antibody Screen: NEGATIVE

## 2023-02-19 LAB — SURGICAL PCR SCREEN
MRSA, PCR: NEGATIVE
Staphylococcus aureus: POSITIVE — AB

## 2023-02-19 LAB — CBC
HCT: 41.8 % (ref 36.0–46.0)
Hemoglobin: 13.3 g/dL (ref 12.0–15.0)
MCH: 29.2 pg (ref 26.0–34.0)
MCHC: 31.8 g/dL (ref 30.0–36.0)
MCV: 91.7 fL (ref 80.0–100.0)
Platelets: 264 10*3/uL (ref 150–400)
RBC: 4.56 MIL/uL (ref 3.87–5.11)
RDW: 14.2 % (ref 11.5–15.5)
WBC: 6.6 10*3/uL (ref 4.0–10.5)
nRBC: 0 % (ref 0.0–0.2)

## 2023-02-19 NOTE — Progress Notes (Signed)
PCP - Kerby Nora, MD Cardiologist - Armanda Magic, MD  PPM/ICD - n/a  Chest x-ray - n/a EKG - 02/06/23 Stress Test - 06/24/20 ECHO - 06/10/20 Cardiac Cath - denies  Sleep Study - OSA+ CPAP - uses nightly  Blood Thinner Instructions: n/a Aspirin Instructions: n/a  ERAS Protcol -Clear liquids until 0430 DOS PRE-SURGERY Ensure or G2- (1) Ensure provided.  COVID TEST- N/A  Anesthesia review: Yes, cardiac clearance received 02/06/23  Patient denies shortness of breath, fever, cough and chest pain at PAT appointment   All instructions explained to the patient, with a verbal understanding of the material. Patient agrees to go over the instructions while at home for a better understanding. Patient also instructed to self quarantine after being tested for COVID-19. The opportunity to ask questions was provided.

## 2023-02-19 NOTE — Progress Notes (Signed)
Surgical Instructions    Your procedure is scheduled on Wednesday February 27, 2023.  Report to [facility] Main Entrance "A" at 5:30 A.M., then check in with the Admitting office.  Call this number if you have problems the morning of surgery:  [phone]   If you have any questions prior to your surgery date call [phone]: Open Monday-Friday 8am-4pm If you experience any cold or flu symptoms such as cough, fever, chills, shortness of breath, etc. between now and your scheduled surgery, please notify us at the above number     Remember:  Do not eat after midnight the night before your surgery  You may drink clear liquids until 4:30 the morning of your surgery.   Clear liquids allowed are: Water, Non-Citrus Juices (without pulp), Carbonated Beverages, Clear Tea, Black Coffee ONLY (NO MILK, CREAM OR POWDERED CREAMER of any kind), and Gatorade.  Patient Instructions  The day of surgery (if you do NOT have diabetes):  Drink ONE (1) Pre-Surgery Clear Ensure by 4:30 am the morning of surgery   This drink was given to you during your hospital  pre-op appointment visit. Nothing else to drink after completing the  Pre-Surgery Clear Ensure.          If you have questions, please contact your surgeon's office.     Take these medicines the morning of surgery with A SIP OF WATER:   metoprolol succinate (TOPROL-XL)   If needed:  albuterol inhaler: please bring with you the day of surgery. fexofenadine (ALLEGRA)  NASACORT ALLERGY   As of today, STOP taking any Aspirin (unless otherwise instructed by your surgeon) Aleve, Naproxen, Ibuprofen, Motrin, Advil, Goody's, BC's, all herbal medications, fish oil, and all vitamins. This includes meloxicam (MOBIC).   Special instructions:    Oral Hygiene is also important to reduce your risk of infection.  Remember - BRUSH YOUR TEETH THE MORNING OF SURGERY WITH YOUR REGULAR TOOTHPASTE    Pre-operative 5 CHG Bath Instructions   You can play a  key role in reducing the risk of infection after surgery. Your skin needs to be as free of germs as possible. You can reduce the number of germs on your skin by washing with CHG (chlorhexidine gluconate) soap before surgery. CHG is an antiseptic soap that kills germs and continues to kill germs even after washing.   DO NOT use if you have an allergy to chlorhexidine/CHG or antibacterial soaps. If your skin becomes reddened or irritated, stop using the CHG and notify one of our RNs at [phone].   Please shower with the CHG soap starting 4 days before surgery using the following schedule:     Please keep in mind the following:  DO NOT shave, including legs and underarms, starting the day of your first shower.   You may shave your face at any point before/day of surgery.  Place clean sheets on your bed the day you start using CHG soap. Use a clean washcloth (not used since being washed) for each shower. DO NOT sleep with pets once you start using the CHG.   CHG Shower Instructions:  If you choose to wash your hair and private area, wash first with your normal shampoo/soap.  After you use shampoo/soap, rinse your hair and body thoroughly to remove shampoo/soap residue.  Turn the water OFF and apply about 3 tablespoons (45 ml) of CHG soap to a CLEAN washcloth.  Apply CHG soap ONLY FROM YOUR NECK DOWN TO YOUR TOES (washing for 3-5 minutes)  DO NOT use CHG soap on face, private areas, open wounds, or sores.  Pay special attention to the area where your surgery is being performed.  If you are having back surgery, having someone wash your back for you may be helpful. Wait 2 minutes after CHG soap is applied, then you may rinse off the CHG soap.  Pat dry with a clean towel  Put on clean clothes/pajamas   If you choose to wear lotion, please use ONLY the CHG-compatible lotions on the back of this paper.     Additional instructions for the day of surgery: DO NOT APPLY any lotions, deodorants,  cologne, or perfumes.   Put on clean/comfortable clothes.  Brush your teeth.  Ask your nurse before applying any prescription medications to the skin.      CHG Compatible Lotions   Aveeno Moisturizing lotion  Cetaphil Moisturizing Cream  Cetaphil Moisturizing Lotion  Clairol Herbal Essence Moisturizing Lotion, Dry Skin  Clairol Herbal Essence Moisturizing Lotion, Extra Dry Skin  Clairol Herbal Essence Moisturizing Lotion, Normal Skin  Curel Age Defying Therapeutic Moisturizing Lotion with Alpha Hydroxy  Curel Extreme Care Body Lotion  Curel Soothing Hands Moisturizing Hand Lotion  Curel Therapeutic Moisturizing Cream, Fragrance-Free  Curel Therapeutic Moisturizing Lotion, Fragrance-Free  Curel Therapeutic Moisturizing Lotion, Original Formula  Eucerin Daily Replenishing Lotion  Eucerin Dry Skin Therapy Plus Alpha Hydroxy Crme  Eucerin Dry Skin Therapy Plus Alpha Hydroxy Lotion  Eucerin Original Crme  Eucerin Original Lotion  Eucerin Plus Crme Eucerin Plus Lotion  Eucerin TriLipid Replenishing Lotion  Keri Anti-Bacterial Hand Lotion  Keri Deep Conditioning Original Lotion Dry Skin Formula Softly Scented  Keri Deep Conditioning Original Lotion, Fragrance Free Sensitive Skin Formula  Keri Lotion Fast Absorbing Fragrance Free Sensitive Skin Formula  Keri Lotion Fast Absorbing Softly Scented Dry Skin Formula  Keri Original Lotion  Keri Skin Renewal Lotion Keri Silky Smooth Lotion  Keri Silky Smooth Sensitive Skin Lotion  Nivea Body Creamy Conditioning Oil  Nivea Body Extra Enriched Lotion  Nivea Body Original Lotion  Nivea Body Sheer Moisturizing Lotion Nivea Crme  Nivea Skin Firming Lotion  NutraDerm 30 Skin Lotion  NutraDerm Skin Lotion  NutraDerm Therapeutic Skin Cream  NutraDerm Therapeutic Skin Lotion  ProShield Protective Hand Cream  Provon moisturizing lotion   [facility] is not responsible for any belongings or valuables.    Do NOT Smoke (Tobacco/Vaping)   24 hours prior to your procedure  If you use a CPAP at night, you may bring your mask for your overnight stay.   Contacts, glasses, hearing aids, dentures or partials may not be worn into surgery, please bring cases for these belongings   For patients admitted to the hospital, discharge time will be determined by your treatment team.   Patients discharged the day of surgery will not be allowed to drive home, and someone needs to stay with them for 24 hours.   SURGICAL WAITING ROOM VISITATION Patients having surgery or a procedure may have no more than 2 support people in the waiting area - these visitors may rotate.   Children under the age of 54 must have an adult with them who is not the patient. If the patient needs to stay at the hospital during part of their recovery, the visitor guidelines for inpatient rooms apply. Pre-op nurse will coordinate an appropriate time for 1 support person to accompany patient in pre-op.  This support person may not rotate.   Please refer to [email or web address] for the visitor guidelines for  Inpatients (after your surgery is over and you are in a regular room).   If you received a COVID test during your pre-op visit, it is requested that you wear a mask when out in public, stay away from anyone that may not be feeling well, and notify your surgeon if you develop symptoms. If you have been in contact with anyone that has tested positive in the last 10 days, please notify your surgeon.    Please read over the following fact sheets that you were given.

## 2023-02-20 NOTE — Anesthesia Preprocedure Evaluation (Addendum)
Anesthesia Evaluation  Patient identified by MRN, date of birth, ID band Patient awake    Reviewed: Allergy & Precautions, NPO status , Patient's Chart, lab work & pertinent test results  History of Anesthesia Complications (+) PONV and history of anesthetic complications  Airway Mallampati: II  TM Distance: >3 FB Neck ROM: Full    Dental  (+) Teeth Intact, Dental Advisory Given   Pulmonary neg shortness of breath, asthma , neg sleep apnea, neg COPD, neg recent URI   breath sounds clear to auscultation       Cardiovascular hypertension, Pt. on medications (-) angina (-) Past MI and (-) CHF  Rhythm:Regular     Neuro/Psych  PSYCHIATRIC DISORDERS Anxiety     negative neurological ROS     GI/Hepatic negative GI ROS, Neg liver ROS,,,  Endo/Other  negative endocrine ROS    Renal/GU negative Renal ROS     Musculoskeletal  (+) Arthritis ,    Abdominal   Peds  Hematology negative hematology ROS (+)   Anesthesia Other Findings   Reproductive/Obstetrics                             Anesthesia Physical Anesthesia Plan  ASA: 2  Anesthesia Plan: General   Post-op Pain Management: Ofirmev IV (intra-op)* and Toradol IV (intra-op)*   Induction: Intravenous  PONV Risk Score and Plan: 4 or greater and Ondansetron, Dexamethasone and Propofol infusion  Airway Management Planned: Oral ETT  Additional Equipment: None  Intra-op Plan:   Post-operative Plan: Extubation in OR  Informed Consent: I have reviewed the patients History and Physical, chart, labs and discussed the procedure including the risks, benefits and alternatives for the proposed anesthesia with the patient or authorized representative who has indicated his/her understanding and acceptance.     Dental advisory given  Plan Discussed with: CRNA  Anesthesia Plan Comments: (PAT note written 02/20/2023 by Shonna Chock, PA-C.  )        Anesthesia Quick Evaluation

## 2023-02-20 NOTE — Progress Notes (Signed)
Anesthesia Chart Review:  Case: [id number] Date/Time: 02/27/23 0715   Procedure: LEFT-SIDED LUMBAR 2- LUMBAR 3, LUMBAR 3- LUMBAR 4, LUMBAR 4- LUMBAR 5 TRANSFORAMINAL LUMBAR INTERBODY FUSION AND DECOMPRESSION WITH INSTRUMENTATION AND ALLOGRAFT (Left)   Anesthesia type: General   Pre-op diagnosis: SPINAL STENOSIS   [facility] ROOM 05 / [facility]   Surgeons: Estill Bamberg, MD       DISCUSSION: Patient is a 72 year old female scheduled for the above procedure.  History includes never smoker, post-operative N/V, HTN, hypercholesterolemia, PVCs, paroxysmal atrial tachycardia, asthma, OSA (uses CPAP), endometrial cancer (s/p Total robotic hysterectomy, BSO, right pelvic LN dissection with left sampling 10/07/12), osteoarthritis (right THA 2002, revision 05/09/15).   Last cardiology visit was on 02/06/23 with Christina Newcomer, PA-C. HTN controlled at that time on losartan/hydrochlorothiazide and metoprolol. PVCs overall controlled on metoprolol. He wrote: "Christina Griffith's perioperative risk of a major cardiac event is 0.4% according to the Revised Cardiac Risk Index (RCRI).  Therefore, she is at low risk for perioperative complications.   Her functional capacity is good at 4.3 METs according to the Duke Activity Status Index (DASI). Recommendations: According to ACC/AHA guidelines, no further cardiovascular testing needed.  The patient may proceed to surgery at acceptable risk."  Anesthesia team to evaluate on the day of surgery.   VS: BP (!) 113/102   Pulse 88   Temp 36.6 C   Resp 17   Ht 5\' 7"  (1.702 m)   Wt 75.3 kg   SpO2 99%   BMI 26.00 kg/m  BP Readings from Last 3 Encounters:  02/19/23 (!) 113/102  02/06/23 130/60  11/07/22 127/78     PROVIDERS: Excell Seltzer, MD is PCP  Armanda Magic, MD is primary cardiologist Steffanie Dunn, MD is EP cardiologist   LABS: Labs reviewed: Acceptable for surgery. LFTs normal 09/26/22.  (all labs ordered are listed, but only abnormal results are  displayed)  Labs Reviewed  SURGICAL PCR SCREEN - Abnormal; Notable for the following components:      Result Value   Staphylococcus aureus POSITIVE (*)    All other components within normal limits  BASIC METABOLIC PANEL  CBC  TYPE AND SCREEN    Sleep Study 11/16/22: FINDINGS:   1. Mild Obstructive Sleep Apnea with AHI 10.2/hr.   2. No Central Sleep Apnea with pAHIc 2.2/hr.   3. Oxygen desaturations as low as 85%.   4. Mild snoring was present. O2 sats were < 88% for 0.6 min.   5. Total sleep time was 6 hrs and 18 min.   6. 12.8% of total sleep time was spent in REM sleep.   7. Shortened sleep onset latency at 6 min.   8. Shortened REM sleep onset latency at 35 min.   9. Total awakenings were 9.  10. Arrhythmia detection: None.    EKG: 02/06/23: Normal sinus rhythm Low voltage QRS No change when compared to prior ECG Confirmed by Christina Griffith [phone]) on 02/06/2023 9:04:46 AM   CV: Long term ZioXT monitor 01/20/21 - 02/03/21: Normal sinus rhythm is the predominant rhythm There are runs of SVT with the longest being 12.9 seconds.   No other sustained arrhythmias.   Patient triggered events were sinus rhythm or during atrial ectopy.   MRI Cardiac 07/13/20: IMPRESSION: 1. Normal left ventricular size, thickness and systolic function (LVEF = 56%). There are no regional wall motion abnormalities and no late gadolinium enhancement in the left ventricular myocardium. Normal extracellular volume (ECV): 29%. 2. Normal right ventricular  size, thickness and systolic function (RVEF = 48%). There are no regional wall motion abnormalities. 3.  Normal left and right atrial size. 4. Normal size of the aortic root, ascending aorta and pulmonary artery. 5.  Mild mitral and trivial tricuspid regurgitation. - There is no evidence for infiltrative or inflammatory cardiomyopathy or arrhythmogenic right ventricular hypertrophy.    Nuclear stress test 06/24/20: The left ventricular ejection  fraction is normal (55-65%). Nuclear stress EF: 60%. There was no ST segment deviation noted during stress. The study is normal. This is a low risk study.   Low risk stress nuclear study with normal perfusion and normal left ventricular regional and global systolic function.   Echo 06/10/20: IMPRESSIONS   1. Left ventricular ejection fraction, by estimation, is 50 to 55%. The  left ventricle has low normal function. The left ventricle has no regional  wall motion abnormalities. Left ventricular diastolic parameters are  consistent with Grade I diastolic  dysfunction (impaired relaxation).   2. Right ventricular systolic function is normal. The right ventricular  size is mildly enlarged.   3. Left atrial size was mildly dilated.   4. Right atrial size was mildly dilated.   5. The mitral valve is normal in structure. Mild mitral valve  regurgitation. No evidence of mitral stenosis.   6. The aortic valve is normal in structure. Aortic valve regurgitation is  not visualized. No aortic stenosis is present.   7. The inferior vena cava is normal in size with greater than 50%  respiratory variability, suggesting right atrial pressure of 3 mmHg.    Past Medical History:  Diagnosis Date   Arthritis    spinal stenosis--back pain--hx of herniated cervical disk- but no surgery   Asthma    Cold 10/03/2012   pt getting over a cold--still has some head congestion, slight cough   Colon polyps    Endometrial ca [facility])    H/O: hematuria    benign essential hematuria - per urology work up    High cholesterol    History of depression    History of pneumonia    Hypertension    Numbness in right leg    pt. states occassional in right leg and right big toe from disc herniations   PAT (paroxysmal atrial tachycardia)    noted on heart monitor 07/2020   PONV (postoperative nausea and vomiting)    with prior hip replacement   PVC's (premature ventricular contractions)    PVCs, trigeminal PVCs,  WCT nonsustained with PVC load 18% by montior 07/2020   Stress incontinence    Wears glasses     Past Surgical History:  Procedure Laterality Date   BREAST SURGERY Right 1980's   breast biopsy   COLONOSCOPY     DENTAL SURGERY  11/2019   EYE SURGERY Bilateral 02/2015   cataracts   HYSTEROSCOPY WITH D & C Left 1/24/204   w/ resection of Endometrial polyps   ROBOTIC ASSISTED TOTAL HYSTERECTOMY WITH BILATERAL SALPINGO OOPHERECTOMY N/A 10/07/2012   Procedure: ROBOTIC ASSISTED TOTAL HYSTERECTOMY WITH BILATERAL SALPINGO OOPHORECTOMY/POSSIBLE LYMPH NODE BIOPSY;  Surgeon: Rejeana Brock A. Duard Brady, MD;  Location: [facility] ORS;  Service: Gynecology;  Laterality: N/A;   TOTAL HIP ARTHROPLASTY     right 2002 left 2004   TOTAL HIP ARTHROPLASTY Left 2004   TOTAL HIP REVISION Right 05/09/2015   Procedure: TOTAL HIP REVISION;  Surgeon: Gean Birchwood, MD;  Location: [facility] OR;  Service: Orthopedics;  Laterality: Right;    MEDICATIONS:  albuterol (PROVENTIL HFA;VENTOLIN HFA)  108 (90 Base) MCG/ACT inhaler   atorvastatin (LIPITOR) 10 MG tablet   Calcium Carb-Cholecalciferol 600-800 MG-UNIT TABS   fexofenadine (ALLEGRA) 180 MG tablet   losartan-hydrochlorothiazide (HYZAAR) 100-25 MG tablet   magnesium gluconate (MAGONATE) 500 MG tablet   meloxicam (MOBIC) 15 MG tablet   metoprolol succinate (TOPROL-XL) 50 MG 24 hr tablet   Multiple Vitamin (MULTIVITAMIN) tablet   polyethylene glycol powder (GLYCOLAX/MIRALAX) 17 GM/SCOOP powder   Triamcinolone Acetonide (NASACORT ALLERGY 24HR NA)   venlafaxine XR (EFFEXOR-XR) 75 MG 24 hr capsule   No current facility-administered medications for this encounter.    Shonna Chock, PA-C Surgical Short Stay/Anesthesiology [facility] Phone [phone] [facility] Phone [phone] 02/20/2023 2:49 PM

## 2023-02-27 ENCOUNTER — Encounter (HOSPITAL_COMMUNITY): Payer: Self-pay | Admitting: Orthopedic Surgery

## 2023-02-27 ENCOUNTER — Ambulatory Visit (HOSPITAL_COMMUNITY): Payer: PPO | Admitting: Vascular Surgery

## 2023-02-27 ENCOUNTER — Ambulatory Visit (HOSPITAL_COMMUNITY): Payer: PPO

## 2023-02-27 ENCOUNTER — Observation Stay (HOSPITAL_COMMUNITY)
Admission: RE | Admit: 2023-02-27 | Discharge: 2023-02-28 | Disposition: A | Discharge status: Home / Self Care | Payer: PPO | Attending: Orthopedic Surgery | Admitting: Orthopedic Surgery

## 2023-02-27 ENCOUNTER — Ambulatory Visit (HOSPITAL_COMMUNITY)
Admission: RE | Disposition: A | Discharge status: Home / Self Care | Payer: Self-pay | Source: Home / Self Care | Attending: Orthopedic Surgery

## 2023-02-27 ENCOUNTER — Other Ambulatory Visit: Payer: Self-pay

## 2023-02-27 ENCOUNTER — Ambulatory Visit (HOSPITAL_BASED_OUTPATIENT_CLINIC_OR_DEPARTMENT_OTHER): Payer: PPO | Admitting: Certified Registered Nurse Anesthetist

## 2023-02-27 DIAGNOSIS — Z96643 Presence of artificial hip joint, bilateral: Secondary | ICD-10-CM | POA: Diagnosis not present

## 2023-02-27 DIAGNOSIS — M5416 Radiculopathy, lumbar region: Secondary | ICD-10-CM | POA: Diagnosis not present

## 2023-02-27 DIAGNOSIS — M532X6 Spinal instabilities, lumbar region: Secondary | ICD-10-CM | POA: Insufficient documentation

## 2023-02-27 DIAGNOSIS — J45909 Unspecified asthma, uncomplicated: Secondary | ICD-10-CM | POA: Diagnosis not present

## 2023-02-27 DIAGNOSIS — I10 Essential (primary) hypertension: Secondary | ICD-10-CM | POA: Diagnosis not present

## 2023-02-27 DIAGNOSIS — M48062 Spinal stenosis, lumbar region with neurogenic claudication: Secondary | ICD-10-CM | POA: Diagnosis not present

## 2023-02-27 DIAGNOSIS — Z9889 Other specified postprocedural states: Secondary | ICD-10-CM

## 2023-02-27 DIAGNOSIS — Z8542 Personal history of malignant neoplasm of other parts of uterus: Secondary | ICD-10-CM | POA: Insufficient documentation

## 2023-02-27 DIAGNOSIS — M4316 Spondylolisthesis, lumbar region: Secondary | ICD-10-CM | POA: Insufficient documentation

## 2023-02-27 DIAGNOSIS — M48061 Spinal stenosis, lumbar region without neurogenic claudication: Secondary | ICD-10-CM

## 2023-02-27 DIAGNOSIS — Z0189 Encounter for other specified special examinations: Secondary | ICD-10-CM | POA: Diagnosis not present

## 2023-02-27 DIAGNOSIS — Z79899 Other long term (current) drug therapy: Secondary | ICD-10-CM | POA: Diagnosis not present

## 2023-02-27 DIAGNOSIS — M4156 Other secondary scoliosis, lumbar region: Secondary | ICD-10-CM | POA: Diagnosis not present

## 2023-02-27 DIAGNOSIS — M4326 Fusion of spine, lumbar region: Secondary | ICD-10-CM | POA: Diagnosis not present

## 2023-02-27 HISTORY — DX: Other specified postprocedural states: Z98.890

## 2023-02-27 HISTORY — PX: TRANSFORAMINAL LUMBAR INTERBODY FUSION (TLIF) WITH PEDICLE SCREW FIXATION 4 LEVEL: SHX6144

## 2023-02-27 SURGERY — TRANSFORAMINAL LUMBAR INTERBODY FUSION (TLIF) WITH PEDICLE SCREW FIXATION 4 LEVEL
Anesthesia: General | Site: Spine Lumbar | Laterality: Left

## 2023-02-27 MED ORDER — POTASSIUM CHLORIDE IN NACL 20-0.9 MEQ/L-% IV SOLN: INTRAVENOUS | Status: DC

## 2023-02-27 MED ORDER — PROPOFOL 500 MG/50ML IV EMUL
INTRAVENOUS | Status: DC | PRN
  Administered 2023-02-27: 15 ug/kg/min via INTRAVENOUS
  Administered 2023-02-27: 25 ug/kg/min via INTRAVENOUS

## 2023-02-27 MED ORDER — BUPIVACAINE-EPINEPHRINE 0.25% -1:200000 IJ SOLN
INTRAMUSCULAR | Status: DC | PRN
  Administered 2023-02-27: 10 mL

## 2023-02-27 MED ORDER — ONDANSETRON HCL 4 MG/2ML IJ SOLN: 4.0000 mg | Freq: Four times a day (QID) | INTRAMUSCULAR | Status: DC | PRN

## 2023-02-27 MED ORDER — MINERAL OIL LIGHT OIL
TOPICAL_OIL | Status: DC | PRN
  Administered 2023-02-27: 1 via TOPICAL

## 2023-02-27 MED ORDER — CEFAZOLIN SODIUM-DEXTROSE 2-4 GM/100ML-% IV SOLN
2.0000 g | INTRAVENOUS | Status: AC
  Administered 2023-02-27 (×2): 2 g via INTRAVENOUS
  Filled 2023-02-27: qty 100

## 2023-02-27 MED ORDER — MIDAZOLAM HCL 2 MG/2ML IJ SOLN
INTRAMUSCULAR | Status: AC
  Filled 2023-02-27: qty 2

## 2023-02-27 MED ORDER — HYDROCHLOROTHIAZIDE 25 MG PO TABS: 25.0000 mg | ORAL_TABLET | Freq: Every day | ORAL | Status: DC

## 2023-02-27 MED ORDER — LOSARTAN POTASSIUM 50 MG PO TABS: 100.0000 mg | ORAL_TABLET | Freq: Every day | ORAL | Status: DC

## 2023-02-27 MED ORDER — ROCURONIUM BROMIDE 10 MG/ML (PF) SYRINGE
PREFILLED_SYRINGE | INTRAVENOUS | Status: AC
  Filled 2023-02-27: qty 10

## 2023-02-27 MED ORDER — METOPROLOL SUCCINATE ER 50 MG PO TB24
50.0000 mg | ORAL_TABLET | Freq: Every day | ORAL | Status: DC
  Administered 2023-02-28: 50 mg via ORAL
  Filled 2023-02-27: qty 1

## 2023-02-27 MED ORDER — METHOCARBAMOL 500 MG PO TABS
500.0000 mg | ORAL_TABLET | Freq: Four times a day (QID) | ORAL | Status: DC | PRN
  Administered 2023-02-27 – 2023-02-28 (×3): 500 mg via ORAL
  Filled 2023-02-27 (×3): qty 1

## 2023-02-27 MED ORDER — DEXAMETHASONE SODIUM PHOSPHATE 10 MG/ML IJ SOLN
INTRAMUSCULAR | Status: AC
  Filled 2023-02-27: qty 1

## 2023-02-27 MED ORDER — LOSARTAN POTASSIUM-HCTZ 100-25 MG PO TABS: 1.0000 | ORAL_TABLET | Freq: Every day | ORAL | Status: DC

## 2023-02-27 MED ORDER — THROMBIN 20000 UNITS EX SOLR
CUTANEOUS | Status: AC
  Filled 2023-02-27: qty 20000

## 2023-02-27 MED ORDER — OXYCODONE HCL 5 MG PO TABS: 5.0000 mg | ORAL_TABLET | Freq: Once | ORAL | Status: DC | PRN

## 2023-02-27 MED ORDER — FLEET ENEMA 7-19 GM/118ML RE ENEM: 1.0000 | ENEMA | Freq: Once | RECTAL | Status: DC | PRN

## 2023-02-27 MED ORDER — ZOLPIDEM TARTRATE 5 MG PO TABS: 5.0000 mg | ORAL_TABLET | Freq: Every evening | ORAL | Status: DC | PRN

## 2023-02-27 MED ORDER — TRIAMCINOLONE ACETONIDE 55 MCG/ACT NA AERO
1.0000 | INHALATION_SPRAY | Freq: Every day | NASAL | Status: DC
  Filled 2023-02-27: qty 10.8

## 2023-02-27 MED ORDER — ADULT MULTIVITAMIN W/MINERALS CH
1.0000 | ORAL_TABLET | Freq: Every day | ORAL | Status: DC
  Administered 2023-02-28: 1 via ORAL
  Filled 2023-02-27: qty 1

## 2023-02-27 MED ORDER — BUPIVACAINE-EPINEPHRINE (PF) 0.25% -1:200000 IJ SOLN
INTRAMUSCULAR | Status: DC | PRN
  Administered 2023-02-27: 40 mL

## 2023-02-27 MED ORDER — THROMBIN 20000 UNITS EX KIT
PACK | CUTANEOUS | Status: DC | PRN
  Administered 2023-02-27: 20 mL via TOPICAL

## 2023-02-27 MED ORDER — VENLAFAXINE HCL ER 75 MG PO CP24
75.0000 mg | ORAL_CAPSULE | Freq: Every day | ORAL | Status: DC
  Administered 2023-02-27: 75 mg via ORAL
  Filled 2023-02-27: qty 1

## 2023-02-27 MED ORDER — PROPOFOL 10 MG/ML IV BOLUS
INTRAVENOUS | Status: DC | PRN
Start: 2023-02-27 — End: 2023-02-27
  Administered 2023-02-27: 50 mg via INTRAVENOUS
  Administered 2023-02-27: 120 mg via INTRAVENOUS

## 2023-02-27 MED ORDER — FENTANYL CITRATE (PF) 100 MCG/2ML IJ SOLN: 25.0000 ug | INTRAMUSCULAR | Status: DC | PRN

## 2023-02-27 MED ORDER — SODIUM CHLORIDE 0.9% FLUSH: 3.0000 mL | INTRAVENOUS | Status: DC | PRN

## 2023-02-27 MED ORDER — OXYCODONE-ACETAMINOPHEN 5-325 MG PO TABS
1.0000 | ORAL_TABLET | ORAL | Status: DC | PRN
  Administered 2023-02-27: 1 via ORAL
  Administered 2023-02-27: 2 via ORAL
  Administered 2023-02-27: 1 via ORAL
  Administered 2023-02-28: 2 via ORAL
  Administered 2023-02-28: 1 via ORAL
  Filled 2023-02-27 (×3): qty 1
  Filled 2023-02-27 (×2): qty 2

## 2023-02-27 MED ORDER — CEFAZOLIN SODIUM-DEXTROSE 2-4 GM/100ML-% IV SOLN
2.0000 g | Freq: Three times a day (TID) | INTRAVENOUS | Status: AC
  Administered 2023-02-27 (×2): 2 g via INTRAVENOUS
  Filled 2023-02-27 (×2): qty 100

## 2023-02-27 MED ORDER — ALBUMIN HUMAN 5 % IV SOLN: INTRAVENOUS | Status: DC | PRN

## 2023-02-27 MED ORDER — ATORVASTATIN CALCIUM 10 MG PO TABS
10.0000 mg | ORAL_TABLET | Freq: Every evening | ORAL | Status: DC
  Administered 2023-02-27: 10 mg via ORAL
  Filled 2023-02-27: qty 1

## 2023-02-27 MED ORDER — MAGNESIUM GLUCONATE 500 MG PO TABS
500.0000 mg | ORAL_TABLET | Freq: Every day | ORAL | Status: DC
  Administered 2023-02-27: 500 mg via ORAL
  Filled 2023-02-27 (×2): qty 1

## 2023-02-27 MED ORDER — METOPROLOL SUCCINATE ER 50 MG PO TB24
75.0000 mg | ORAL_TABLET | Freq: Every day | ORAL | Status: DC
  Administered 2023-02-27: 75 mg via ORAL
  Filled 2023-02-27: qty 1

## 2023-02-27 MED ORDER — ONDANSETRON HCL 4 MG PO TABS: 4.0000 mg | ORAL_TABLET | Freq: Four times a day (QID) | ORAL | Status: DC | PRN

## 2023-02-27 MED ORDER — MORPHINE SULFATE (PF) 2 MG/ML IV SOLN: 1.0000 mg | INTRAVENOUS | Status: DC | PRN

## 2023-02-27 MED ORDER — ACETAMINOPHEN 500 MG PO TABS: 1000.0000 mg | ORAL_TABLET | Freq: Once | ORAL | Status: DC | PRN

## 2023-02-27 MED ORDER — LIDOCAINE 2% (20 MG/ML) 5 ML SYRINGE
INTRAMUSCULAR | Status: AC
  Filled 2023-02-27: qty 5

## 2023-02-27 MED ORDER — LORATADINE 10 MG PO TABS
10.0000 mg | ORAL_TABLET | Freq: Every day | ORAL | Status: DC
  Filled 2023-02-27: qty 1

## 2023-02-27 MED ORDER — PHENYLEPHRINE HCL-NACL 20-0.9 MG/250ML-% IV SOLN
INTRAVENOUS | Status: DC | PRN
  Administered 2023-02-27: 25 ug/min via INTRAVENOUS

## 2023-02-27 MED ORDER — PHENOL 1.4 % MT LIQD: 1.0000 | OROMUCOSAL | Status: DC | PRN

## 2023-02-27 MED ORDER — SENNOSIDES-DOCUSATE SODIUM 8.6-50 MG PO TABS: 1.0000 | ORAL_TABLET | Freq: Every evening | ORAL | Status: DC | PRN

## 2023-02-27 MED ORDER — CALCIUM CARB-CHOLECALCIFEROL 600-800 MG-UNIT PO TABS: 1.0000 | ORAL_TABLET | Freq: Every day | ORAL | Status: DC

## 2023-02-27 MED ORDER — KETOROLAC TROMETHAMINE 30 MG/ML IJ SOLN
INTRAMUSCULAR | Status: DC | PRN
  Administered 2023-02-27: 30 mg via INTRAVENOUS

## 2023-02-27 MED ORDER — ROCURONIUM BROMIDE 10 MG/ML (PF) SYRINGE
PREFILLED_SYRINGE | INTRAVENOUS | Status: DC | PRN
  Administered 2023-02-27: 20 mg via INTRAVENOUS
  Administered 2023-02-27 (×2): 10 mg via INTRAVENOUS
  Administered 2023-02-27 (×2): 20 mg via INTRAVENOUS
  Administered 2023-02-27: 10 mg via INTRAVENOUS
  Administered 2023-02-27: 60 mg via INTRAVENOUS
  Administered 2023-02-27: 10 mg via INTRAVENOUS

## 2023-02-27 MED ORDER — ACETAMINOPHEN 160 MG/5ML PO SOLN: 1000.0000 mg | Freq: Once | ORAL | Status: DC | PRN

## 2023-02-27 MED ORDER — STERILE WATER FOR IRRIGATION IR SOLN
Status: DC | PRN
  Administered 2023-02-27: 1000 mL

## 2023-02-27 MED ORDER — ACETAMINOPHEN 10 MG/ML IV SOLN: 1000.0000 mg | Freq: Once | INTRAVENOUS | Status: DC | PRN

## 2023-02-27 MED ORDER — CEFAZOLIN SODIUM-DEXTROSE 2-4 GM/100ML-% IV SOLN
INTRAVENOUS | Status: AC
  Filled 2023-02-27: qty 200

## 2023-02-27 MED ORDER — ONDANSETRON HCL 4 MG/2ML IJ SOLN
INTRAMUSCULAR | Status: AC
  Filled 2023-02-27: qty 2

## 2023-02-27 MED ORDER — ONDANSETRON HCL 4 MG/2ML IJ SOLN
INTRAMUSCULAR | Status: DC | PRN
Start: 2023-02-27 — End: 2023-02-27
  Administered 2023-02-27: 4 mg via INTRAVENOUS

## 2023-02-27 MED ORDER — BUPIVACAINE LIPOSOME 1.3 % IJ SUSP
INTRAMUSCULAR | Status: AC
  Filled 2023-02-27: qty 20

## 2023-02-27 MED ORDER — BISACODYL 5 MG PO TBEC: 5.0000 mg | DELAYED_RELEASE_TABLET | Freq: Every day | ORAL | Status: DC | PRN

## 2023-02-27 MED ORDER — ORAL CARE MOUTH RINSE: 15.0000 mL | Freq: Once | OROMUCOSAL | Status: AC

## 2023-02-27 MED ORDER — DEXAMETHASONE SODIUM PHOSPHATE 10 MG/ML IJ SOLN
INTRAMUSCULAR | Status: DC | PRN
  Administered 2023-02-27: 10 mg via INTRAVENOUS

## 2023-02-27 MED ORDER — METHOCARBAMOL 1000 MG/10ML IJ SOLN: 500.0000 mg | Freq: Four times a day (QID) | INTRAVENOUS | Status: DC | PRN

## 2023-02-27 MED ORDER — ALUM & MAG HYDROXIDE-SIMETH 200-200-20 MG/5ML PO SUSP: 30.0000 mL | Freq: Four times a day (QID) | ORAL | Status: DC | PRN

## 2023-02-27 MED ORDER — FENTANYL CITRATE (PF) 250 MCG/5ML IJ SOLN
INTRAMUSCULAR | Status: AC
  Filled 2023-02-27: qty 5

## 2023-02-27 MED ORDER — DOCUSATE SODIUM 100 MG PO CAPS
100.0000 mg | ORAL_CAPSULE | Freq: Two times a day (BID) | ORAL | Status: DC
  Administered 2023-02-27 – 2023-02-28 (×2): 100 mg via ORAL
  Filled 2023-02-27 (×2): qty 1

## 2023-02-27 MED ORDER — ACETAMINOPHEN 325 MG PO TABS: 650.0000 mg | ORAL_TABLET | ORAL | Status: DC | PRN

## 2023-02-27 MED ORDER — ACETAMINOPHEN 10 MG/ML IV SOLN
INTRAVENOUS | Status: DC | PRN
  Administered 2023-02-27: 1000 mg via INTRAVENOUS

## 2023-02-27 MED ORDER — BUPIVACAINE-EPINEPHRINE (PF) 0.25% -1:200000 IJ SOLN
INTRAMUSCULAR | Status: AC
  Filled 2023-02-27: qty 30

## 2023-02-27 MED ORDER — MIDAZOLAM HCL 2 MG/2ML IJ SOLN
INTRAMUSCULAR | Status: DC | PRN
  Administered 2023-02-27 (×2): 1 mg via INTRAVENOUS

## 2023-02-27 MED ORDER — METOPROLOL SUCCINATE ER 50 MG PO TB24: 50.0000 mg | ORAL_TABLET | ORAL | Status: DC

## 2023-02-27 MED ORDER — CHLORHEXIDINE GLUCONATE 0.12 % MT SOLN
15.0000 mL | Freq: Once | OROMUCOSAL | Status: AC
  Administered 2023-02-27: 15 mL via OROMUCOSAL
  Filled 2023-02-27: qty 15

## 2023-02-27 MED ORDER — 0.9 % SODIUM CHLORIDE (POUR BTL) OPTIME
TOPICAL | Status: DC | PRN
  Administered 2023-02-27 (×4): 1000 mL

## 2023-02-27 MED ORDER — PROPOFOL 10 MG/ML IV BOLUS
INTRAVENOUS | Status: AC
  Filled 2023-02-27: qty 20

## 2023-02-27 MED ORDER — KETOROLAC TROMETHAMINE 30 MG/ML IJ SOLN
INTRAMUSCULAR | Status: AC
  Filled 2023-02-27: qty 1

## 2023-02-27 MED ORDER — SODIUM CHLORIDE 0.9% FLUSH
3.0000 mL | Freq: Two times a day (BID) | INTRAVENOUS | Status: DC
  Administered 2023-02-27: 3 mL via INTRAVENOUS

## 2023-02-27 MED ORDER — ACETAMINOPHEN 10 MG/ML IV SOLN
INTRAVENOUS | Status: AC
  Filled 2023-02-27: qty 100

## 2023-02-27 MED ORDER — LACTATED RINGERS IV SOLN: INTRAVENOUS | Status: DC

## 2023-02-27 MED ORDER — OXYCODONE HCL 5 MG/5ML PO SOLN: 5.0000 mg | Freq: Once | ORAL | Status: DC | PRN

## 2023-02-27 MED ORDER — HYDROCODONE-ACETAMINOPHEN 5-325 MG PO TABS: 1.0000 | ORAL_TABLET | ORAL | Status: DC | PRN

## 2023-02-27 MED ORDER — MENTHOL 3 MG MT LOZG: 1.0000 | LOZENGE | OROMUCOSAL | Status: DC | PRN

## 2023-02-27 MED ORDER — ALBUTEROL SULFATE (2.5 MG/3ML) 0.083% IN NEBU: 3.0000 mL | INHALATION_SOLUTION | Freq: Four times a day (QID) | RESPIRATORY_TRACT | Status: DC | PRN

## 2023-02-27 MED ORDER — FENTANYL CITRATE (PF) 250 MCG/5ML IJ SOLN
INTRAMUSCULAR | Status: DC | PRN
  Administered 2023-02-27 (×4): 50 ug via INTRAVENOUS
  Administered 2023-02-27: 100 ug via INTRAVENOUS
  Administered 2023-02-27 (×2): 50 ug via INTRAVENOUS

## 2023-02-27 MED ORDER — POVIDONE-IODINE 7.5 % EX SOLN
Freq: Once | CUTANEOUS | Status: DC
  Filled 2023-02-27: qty 118

## 2023-02-27 MED ORDER — ACETAMINOPHEN 650 MG RE SUPP: 650.0000 mg | RECTAL | Status: DC | PRN

## 2023-02-27 MED ORDER — SODIUM CHLORIDE 0.9 % IV SOLN: 250.0000 mL | INTRAVENOUS | Status: DC

## 2023-02-27 MED ORDER — SUGAMMADEX SODIUM 200 MG/2ML IV SOLN
INTRAVENOUS | Status: DC | PRN
  Administered 2023-02-27: 100 mg via INTRAVENOUS
  Administered 2023-02-27: 200 mg via INTRAVENOUS

## 2023-02-27 MED ORDER — LIDOCAINE 2% (20 MG/ML) 5 ML SYRINGE
INTRAMUSCULAR | Status: DC | PRN
  Administered 2023-02-27: 60 mg via INTRAVENOUS

## 2023-02-27 MED ORDER — POLYETHYLENE GLYCOL 3350 17 G PO PACK: 17.0000 g | PACK | Freq: Every day | ORAL | Status: DC | PRN

## 2023-02-27 SURGICAL SUPPLY — 98 items
AGENT HMST KT MTR STRL THRMB (HEMOSTASIS)
APL SKNCLS STERI-STRIP NONHPOA (GAUZE/BANDAGES/DRESSINGS) ×1
BAG COUNTER SPONGE SURGICOUNT (BAG) ×1 IMPLANT
BAG SPNG CNTER NS LX DISP (BAG) ×1
BENZOIN TINCTURE PRP APPL 2/3 (GAUZE/BANDAGES/DRESSINGS) ×1 IMPLANT
BLADE CLIPPER SURG (BLADE) IMPLANT
BUR PRESCISION 1.7 ELITE (BURR) ×1 IMPLANT
BUR ROUND FLUTED 5 RND (BURR) ×1 IMPLANT
BUR ROUND PRECISION 4.0 (BURR) IMPLANT
BUR SABER RD CUTTING 3.0 (BURR) IMPLANT
CAGE SABLE 10X26 6-12 0D (Cage) IMPLANT
CAGE SABLE 10X26 6-12 8D (Cage) IMPLANT
CANNULA GRAFT BNE VG PRE-FILL (Bone Implant) IMPLANT
CNTNR URN SCR LID CUP LEK RST (MISCELLANEOUS) ×1 IMPLANT
CONT SPEC 4OZ STRL OR WHT (MISCELLANEOUS) ×2
COVER MAYO STAND STRL (DRAPES) ×2 IMPLANT
COVER SURGICAL LIGHT HANDLE (MISCELLANEOUS) ×1 IMPLANT
DISPENSER GRAFT BNE VG (MISCELLANEOUS) IMPLANT
DISPENSER VIVIGEN BONE GRAFT (MISCELLANEOUS) ×1
DRAIN CHANNEL 15F RND FF W/TCR (WOUND CARE) IMPLANT
DRAPE C-ARM 42X72 X-RAY (DRAPES) ×1 IMPLANT
DRAPE C-ARMOR (DRAPES) IMPLANT
DRAPE POUCH INSTRU U-SHP 10X18 (DRAPES) ×1 IMPLANT
DRAPE SURG 17X23 STRL (DRAPES) ×4 IMPLANT
DURAPREP 26ML APPLICATOR (WOUND CARE) ×1 IMPLANT
ELECT BLADE 4.0 EZ CLEAN MEGAD (MISCELLANEOUS) ×1
ELECT CAUTERY BLADE 6.4 (BLADE) ×1 IMPLANT
ELECT REM PT RETURN 9FT ADLT (ELECTROSURGICAL) ×1
ELECTRODE BLDE 4.0 EZ CLN MEGD (MISCELLANEOUS) ×1 IMPLANT
ELECTRODE REM PT RTRN 9FT ADLT (ELECTROSURGICAL) ×1 IMPLANT
EVACUATOR SILICONE 100CC (DRAIN) IMPLANT
FILTER STRAW FLUID ASPIR (MISCELLANEOUS) ×1 IMPLANT
GAUZE 4X4 16PLY ~~LOC~~+RFID DBL (SPONGE) ×1 IMPLANT
GAUZE SPONGE 4X4 12PLY STRL (GAUZE/BANDAGES/DRESSINGS) ×1 IMPLANT
GLOVE BIO SURGEON STRL SZ 6.5 (GLOVE) ×1 IMPLANT
GLOVE BIO SURGEON STRL SZ8 (GLOVE) ×1 IMPLANT
GLOVE BIOGEL PI IND STRL 7.0 (GLOVE) ×1 IMPLANT
GLOVE BIOGEL PI IND STRL 8 (GLOVE) ×1 IMPLANT
GLOVE SURG ENC MOIS LTX SZ6.5 (GLOVE) ×1 IMPLANT
GOWN STRL REUS W/ TWL LRG LVL3 (GOWN DISPOSABLE) ×2 IMPLANT
GOWN STRL REUS W/ TWL XL LVL3 (GOWN DISPOSABLE) ×1 IMPLANT
GOWN STRL REUS W/TWL LRG LVL3 (GOWN DISPOSABLE) ×2
GOWN STRL REUS W/TWL XL LVL3 (GOWN DISPOSABLE) ×1
GRAFT BONE CANNULA VIVIGEN 3 (Bone Implant) ×4 IMPLANT
IV CATH 14GX2 1/4 (CATHETERS) ×1 IMPLANT
KIT BASIN OR (CUSTOM PROCEDURE TRAY) ×1 IMPLANT
KIT POSITION SURG JACKSON T1 (MISCELLANEOUS) ×1 IMPLANT
KIT TURNOVER KIT B (KITS) ×1 IMPLANT
MARKER SKIN DUAL TIP RULER LAB (MISCELLANEOUS) ×2 IMPLANT
NDL 18GX1X1/2 (RX/OR ONLY) (NEEDLE) ×1 IMPLANT
NDL 22X1.5 STRL (OR ONLY) (MISCELLANEOUS) ×2 IMPLANT
NDL HYPO 25GX1X1/2 BEV (NEEDLE) ×1 IMPLANT
NDL SPNL 18GX3.5 QUINCKE PK (NEEDLE) ×2 IMPLANT
NEEDLE 18GX1X1/2 (RX/OR ONLY) (NEEDLE) ×1
NEEDLE 22X1.5 STRL (OR ONLY) (MISCELLANEOUS) ×2
NEEDLE HYPO 25GX1X1/2 BEV (NEEDLE) ×2
NEEDLE SPNL 18GX3.5 QUINCKE PK (NEEDLE) ×2
NS IRRIG 1000ML POUR BTL (IV SOLUTION) ×1 IMPLANT
PACK LAMINECTOMY ORTHO (CUSTOM PROCEDURE TRAY) ×1 IMPLANT
PACK UNIVERSAL I (CUSTOM PROCEDURE TRAY) ×1 IMPLANT
PAD ARMBOARD 7.5X6 YLW CONV (MISCELLANEOUS) ×2 IMPLANT
PATTIES SURGICAL .5 X1 (DISPOSABLE) ×1 IMPLANT
PATTIES SURGICAL .5X1.5 (GAUZE/BANDAGES/DRESSINGS) ×1 IMPLANT
PUTTY DBX 2.5CC (Putty) ×1 IMPLANT
PUTTY DBX 2.5CC DEPUY (Putty) IMPLANT
PUTTY DBX 5CC (Putty) IMPLANT
ROD EXPEDIUM PREBENT 85MM (Rod) IMPLANT
SCREW SET SINGLE INNER (Screw) IMPLANT
SCREW VIPER CORT FIX 4.35X45 (Screw) IMPLANT
SCREW VIPER CORT FIX 5.00X45 (Screw) IMPLANT
SCREW VIPER CORT FIX 6.00X30 (Screw) IMPLANT
SCREW VIPER CORTICAL FIX 6X40 (Screw) IMPLANT
SPONGE INTESTINAL PEANUT (DISPOSABLE) ×1 IMPLANT
SPONGE SURGIFOAM ABS GEL 100 (HEMOSTASIS) ×1 IMPLANT
STRIP CLOSURE SKIN 1/2X4 (GAUZE/BANDAGES/DRESSINGS) ×2 IMPLANT
SURGIFLO W/THROMBIN 8M KIT (HEMOSTASIS) IMPLANT
SUT BONE WAX W31G (SUTURE) IMPLANT
SUT ETHILON 2 0 FS 18 (SUTURE) IMPLANT
SUT MNCRL AB 4-0 PS2 18 (SUTURE) ×1 IMPLANT
SUT VIC AB 0 CT1 18XCR BRD 8 (SUTURE) ×1 IMPLANT
SUT VIC AB 0 CT1 8-18 (SUTURE) ×1
SUT VIC AB 1 CT1 18XCR BRD 8 (SUTURE) ×1 IMPLANT
SUT VIC AB 1 CT1 8-18 (SUTURE) ×2
SUT VIC AB 2-0 CT2 18 VCP726D (SUTURE) ×1 IMPLANT
SYR 20ML LL LF (SYRINGE) ×2 IMPLANT
SYR BULB IRRIG 60ML STRL (SYRINGE) ×1 IMPLANT
SYR CONTROL 10ML LL (SYRINGE) ×2 IMPLANT
SYR TB 1ML LUER SLIP (SYRINGE) ×1 IMPLANT
TAP EXPEDIUM DL 4.35 (INSTRUMENTS) IMPLANT
TAP EXPEDIUM DL 5.0 (INSTRUMENTS) IMPLANT
TAP EXPEDIUM DL 6.0 (INSTRUMENTS) IMPLANT
TAPE CLOTH SURG 4X10 WHT LF (GAUZE/BANDAGES/DRESSINGS) IMPLANT
TRAY FOLEY MTR SLVR 16FR STAT (SET/KITS/TRAYS/PACK) ×1 IMPLANT
TRAY FOLEY W/BAG SLVR 14FR (SET/KITS/TRAYS/PACK) IMPLANT
TUBE FUNNEL GL DISP (ORTHOPEDIC DISPOSABLE SUPPLIES) IMPLANT
TUBING ANTICOAG CELL SAVER (IV SETS) IMPLANT
WATER STERILE IRR 1000ML POUR (IV SOLUTION) ×1 IMPLANT
YANKAUER SUCT BULB TIP NO VENT (SUCTIONS) ×1 IMPLANT

## 2023-02-27 NOTE — H&P (Signed)
PREOPERATIVE H&P  Chief Complaint: Left leg pain  HPI: Christina Griffith is a 72 y.o. female who presents with ongoing pain in the left leg  MRI reveals stenosis and instability spanning L2-L5  Patient has failed multiple forms of conservative care and continues to have pain (see office notes for additional details regarding the patient's full course of treatment)  Past Medical History:  Diagnosis Date   Arthritis    spinal stenosis--back pain--hx of herniated cervical disk- but no surgery   Asthma    Cold 10/03/2012   pt getting over a cold--still has some head congestion, slight cough   Colon polyps    Endometrial ca [facility])    H/O: hematuria    benign essential hematuria - per urology work up    High cholesterol    History of depression    History of pneumonia    Hypertension    Numbness in right leg    pt. states occassional in right leg and right big toe from disc herniations   PAT (paroxysmal atrial tachycardia)    noted on heart monitor 07/2020   PONV (postoperative nausea and vomiting)    with prior hip replacement   PVC's (premature ventricular contractions)    PVCs, trigeminal PVCs, WCT nonsustained with PVC load 18% by montior 07/2020   Stress incontinence    Wears glasses    Past Surgical History:  Procedure Laterality Date   BREAST SURGERY Right 1980's   breast biopsy   COLONOSCOPY     DENTAL SURGERY  11/2019   EYE SURGERY Bilateral 02/2015   cataracts   HYSTEROSCOPY WITH D & C Left 1/24/204   w/ resection of Endometrial polyps   ROBOTIC ASSISTED TOTAL HYSTERECTOMY WITH BILATERAL SALPINGO OOPHERECTOMY N/A 10/07/2012   Procedure: ROBOTIC ASSISTED TOTAL HYSTERECTOMY WITH BILATERAL SALPINGO OOPHORECTOMY/POSSIBLE LYMPH NODE BIOPSY;  Surgeon: Rejeana Brock A. Duard Brady, MD;  Location: [facility] ORS;  Service: Gynecology;  Laterality: N/A;   TOTAL HIP ARTHROPLASTY     right 2002 left 2004   TOTAL HIP ARTHROPLASTY Left 2004   TOTAL HIP REVISION Right 05/09/2015    Procedure: TOTAL HIP REVISION;  Surgeon: Gean Birchwood, MD;  Location: [facility] OR;  Service: Orthopedics;  Laterality: Right;   Social History   Socioeconomic History   Marital status: Single    Spouse name: Not on file   Number of children: Not on file   Years of education: Not on file   Highest education level: Not on file  Occupational History   Occupation: Freight forwarder    Employer: [employer]  Tobacco Use   Smoking status: Never   Smokeless tobacco: Never  Vaping Use   Vaping status: Never Used  Substance and Sexual Activity   Alcohol use: Yes    Alcohol/week: 3.0 standard drinks of alcohol    Types: 3 Glasses of wine per week    Comment: 3 x a week   Drug use: No   Sexual activity: Not on file  Other Topics Concern   Not on file  Social History Narrative   Regular exercise- yes, 5-6 days a week, yoga   Diet: healthy   Social Determinants of Health   Financial Resource Strain: Low Risk  (05/17/2022)   Overall Financial Resource Strain (CARDIA)    Difficulty of Paying Living Expenses: Not hard at all  Food Insecurity: No Food Insecurity (05/17/2022)   Hunger Vital Sign    Worried About Running Out of Food in the  Last Year: Never true    Ran Out of Food in the Last Year: Never true  Transportation Needs: No Transportation Needs (05/17/2022)   PRAPARE - Administrator, Civil Service (Medical): No    Lack of Transportation (Non-Medical): No  Physical Activity: Sufficiently Active (05/17/2022)   Exercise Vital Sign    Days of Exercise per Week: 5 days    Minutes of Exercise per Session: 40 min  Stress: No Stress Concern Present (05/17/2022)   [employer] of Occupational Health - Occupational Stress Questionnaire    Feeling of Stress : Not at all  Social Connections: Unknown (05/17/2022)   Social Connection and Isolation Panel [NHANES]    Frequency of Communication with Friends and Family: Three times a week    Frequency of Social Gatherings  with Friends and Family: Never    Attends Religious Services: More than 4 times per year    Active Member of [employer] or Organizations: Yes    Attends Banker Meetings: 1 to 4 times per year    Marital Status: Not on file   Family History  Problem Relation Age of Onset   Hypertension Mother    Cancer Father        bladder and prostate   Hyperlipidemia Father    Cancer Maternal Grandmother        colon   Breast cancer Cousin    No Known Allergies Prior to Admission medications   Medication Sig Start Date End Date Taking? Authorizing Provider  albuterol (PROVENTIL HFA;VENTOLIN HFA) 108 (90 Base) MCG/ACT inhaler Inhale 2 puffs into the lungs every 6 (six) hours as needed for wheezing or shortness of breath. 11/20/16  Yes Dianne Dun, MD  atorvastatin (LIPITOR) 10 MG tablet Take 1 tablet (10 mg total) by mouth daily. Patient taking differently: Take 10 mg by mouth every evening. 12/03/22  Yes Bedsole, Amy E, MD  Calcium Carb-Cholecalciferol 600-800 MG-UNIT TABS Take 1 tablet by mouth daily.   Yes [provider]  fexofenadine (ALLEGRA) 180 MG tablet Take 180 mg by mouth daily as needed for allergies or rhinitis.   Yes [provider]  losartan-hydrochlorothiazide (HYZAAR) 100-25 MG tablet Take 1 tablet by mouth daily. 10/15/22  Yes Bedsole, Amy E, MD  magnesium gluconate (MAGONATE) 500 MG tablet Take 500 mg by mouth at bedtime.   Yes [provider]  meloxicam (MOBIC) 15 MG tablet Take 1 tablet by mouth once a day 01/24/22  Yes   Multiple Vitamin (MULTIVITAMIN) tablet Take 1 tablet by mouth daily.   Yes [provider]  polyethylene glycol powder (GLYCOLAX/MIRALAX) 17 GM/SCOOP powder Take 17 g by mouth daily as needed (constipation).   Yes [provider]  venlafaxine XR (EFFEXOR-XR) 75 MG 24 hr capsule Take 1 capsule (75 mg total) by mouth daily. Patient taking differently: Take 75 mg by mouth at bedtime. 02/11/23  Yes Bedsole, Amy E, MD   metoprolol succinate (TOPROL-XL) 50 MG 24 hr tablet Take 1&1/2 tablets (75 mg total) by mouth in the morning and at bedtime. Take with or immediately following a meal. Patient taking differently: Take 50-75 mg by mouth See admin instructions. 50 mg in the morning, 75 mg at bedtime 01/03/23   Lanier Prude, MD  Triamcinolone Acetonide (NASACORT ALLERGY 24HR NA) Place 1 spray into the nose daily as needed (allergies).    [provider]     All other systems have been reviewed and were otherwise negative with the exception  of those mentioned in the HPI and as above.  Physical Exam: Vitals:   02/27/23 0556  BP: (!) 140/76  Pulse: 94  Resp: 18  Temp: 98.3 F (36.8 C)  SpO2: 97%    Body mass index is 26 kg/m.  General: Alert, no acute distress Cardiovascular: No pedal edema Respiratory: No cyanosis, no use of accessory musculature Skin: No lesions in the area of chief complaint Neurologic: Sensation intact distally Psychiatric: Patient is competent for consent with normal mood and affect Lymphatic: No axillary or cervical lymphadenopathy   Assessment/Plan: SPINAL STENOSIS and instability spanning L2-L5 Plan for Procedure(s): LEFT-SIDED LUMBAR 2- LUMBAR 3, LUMBAR 3- LUMBAR 4, LUMBAR 4- LUMBAR 5 TRANSFORAMINAL LUMBAR INTERBODY FUSION AND DECOMPRESSION WITH INSTRUMENTATION AND ALLOGRAFT   Jackelyn Hoehn, MD 02/27/2023 7:20 AM

## 2023-02-27 NOTE — Transfer of Care (Signed)
Immediate Anesthesia Transfer of Care Note  Patient: Christina Griffith  Procedure(s) Performed: LEFT-SIDED LUMBAR 2- LUMBAR 3, LUMBAR 3- LUMBAR 4, LUMBAR 4- LUMBAR 5 TRANSFORAMINAL LUMBAR INTERBODY FUSION AND DECOMPRESSION WITH INSTRUMENTATION AND ALLOGRAFT (Left: Spine Lumbar)  Patient Location: PACU  Anesthesia Type:General  Level of Consciousness: awake, alert , patient cooperative, and responds to stimulation  Airway & Oxygen Therapy: Patient Spontanous Breathing and Patient connected to face mask oxygen  Post-op Assessment: Report given to RN, Post -op Vital signs reviewed and stable, and Patient moving all extremities  Post vital signs: Reviewed and stable  Last Vitals:  Vitals Value Taken Time  BP 100/59 02/27/23 1401  Temp    Pulse 97 02/27/23 1403  Resp 17 02/27/23 1403  SpO2 100 % 02/27/23 1403  Vitals shown include unfiled device data.  Last Pain:  Vitals:   02/27/23 0556  TempSrc: Oral         Complications: No notable events documented.

## 2023-02-27 NOTE — Anesthesia Procedure Notes (Addendum)
Procedure Name: Intubation Date/Time: 02/27/2023 7:46 AM  Performed by: Shary Decamp, CRNAPre-anesthesia Checklist: Patient identified, Patient being monitored, Timeout performed, Emergency Drugs available and Suction available Patient Re-evaluated:Patient Re-evaluated prior to induction Oxygen Delivery Method: Circle System Utilized Preoxygenation: Pre-oxygenation with 100% oxygen Induction Type: IV induction Ventilation: Mask ventilation without difficulty Laryngoscope Size: Miller and 2 Grade View: Grade I Tube type: Oral Tube size: 7.0 mm Number of attempts: 1 Airway Equipment and Method: Stylet Placement Confirmation: ETT inserted through vocal cords under direct vision, positive ETCO2 and breath sounds checked- equal and bilateral Secured at: 21 cm Tube secured with: Tape Dental Injury: Teeth and Oropharynx as per pre-operative assessment  Comments: Soft bite oral airway used.

## 2023-02-27 NOTE — Op Note (Addendum)
PATIENT NAME: Christina Griffith  MEDICAL RECORD NO.:   [id number]   DATE OF BIRTH: 10-16-1950   DATE OF PROCEDURE: 08/02/2021                                 OPERATIVE REPORT     PREOPERATIVE DIAGNOSES: 1. Severe spinal stenosis L2-3, L3-4, L4-5 (M48.062) 2. Bilateral lumbar radiculopathy (M54.16) 3. L4-5 spondylolisthesis (M43.16) 4. Degenerative lumbar scoliosis (M53.2X6)   POSTOPERATIVE DIAGNOSES: 1. Severe spinal stenosis L2-3, L3-4, L4-5 (M48.062) 2. Bilateral lumbar radiculopathy (M54.16) 3. L4-5 spondylolisthesis (M43.16) 4. Degenerative lumbar scoliosis (M53.2X6)   PROCEDURES: 1. Lumbar decompression, L2-3, L3-4, L4-5, including bilateral partial facetectomy, and bilateral lumbar decompression 2. Left-sided L2-3, L3-4, L4-5 transforaminal lumbar interbody fusion. 3. Right-sided L2-3, L3-4, L4-5 posterolateral fusion. 4. Insertion of interbody device x 3 (Globus expandable intervertebral spacers). 5. Placement of segmental posterior instrumentation L2, L3, L4, L5, bilaterally. 6. Use of local autograft. 7. Use of morselized allograft - ViviGen. 8. Intraoperative use of fluoroscopy.   SURGEON:  Estill Bamberg, MD.   ASSISTANTJason Coop, PA-C.   ANESTHESIA:  General endotracheal anesthesia.   COMPLICATIONS:  None.   DISPOSITION:  Stable.   ESTIMATED BLOOD LOSS:  100 cc   INDICATIONS FOR SURGERY:  Briefly, Ms. Deutschman is a pleasant 72 y.o. -year-old female who did present to me with severe and pain in the left leg. I did feel that her symptoms were secondary to the findings noted above. The patient failed conservative care and did wish to proceed with the procedure  noted above.    OPERATIVE DETAILS:  On 02/27/2023, the patient was brought to surgery and general endotracheal anesthesia was administered.  The patient was placed prone on a well-padded flat Jackson bed with a spinal frame.  Antibiotics were given and a time-out procedure was performed. The  back was prepped and draped in the usual fashion.  A midline incision was made overlying the L2-3, L3-4 and L4-5 intervertebral spaces.  The fascia was incised at the midline.  The paraspinal musculature was bluntly swept laterally.  Anatomic landmarks for the pedicles were exposed. Using fluoroscopy, I did cannulate the L2, L3, L4, and L5 pedicles bilaterally, using a medial to lateral cortical trajectory technique.  On the right side, the posterolateral gutter and facet joints at L2-3, L3-4 and L4-5 were decorticated and 6 mm screws of the appropriate length were placed at L3, L4, and L5 pedicles.  A 5 mm screw was placed into the right L2 pedicle, as this pedicle was noted to be extremely small.  An 85-mm rod was placed and distraction was applied across the rod at each intervertebral level.  On the left side, the cannulated pedicle holes were filled with bone wax.  I then proceeded with the decompressive aspect of the procedure.     Starting at L4-5, I did perform a laminotomy and a partial facetectomy bilaterally.  I did decompress the right and left lateral recess in this fashion.  A similar decompression was performed at L3-4, and then at L2-3.  I was very pleased with the decompression that I was able to accomplish.  I then turned my attention back to the L4-5 intervertebral space.  I proceeded with a near full facetectomy on the left , after which point the left L5 nerve was medially retracted. I then performed a thorough and complete L4-5 intervertebral discectomy.  The intervertebral space was then  liberally packed with autograft from the decompression, as well as allograft in the form of ViviGen, as was the appropriately sized intervertebral spacer.  The spacer was then expanded to approximately 7.35 mm in height. Distraction was then released on the contralateral right side.  I then turned my attention to the L3-4 level.  With an assistant holding medial retraction of the traversing left L4  nerve, I did perform an annulotomy at the posterolateral aspect of the L3-4 intervertebral space.  I then used a series of curettes and pituitary rongeurs to perform a thorough and complete intervertebral diskectomy.  The intervertebral space was then liberally packed with autograft as well as allograft in the form of ViviGen, as was the appropriate-sized intervertebral spacer.  The spacer was then tamped into position in the usual fashion, and was then expanded to approximately 7.8 mm in height.  I was very pleased with the press-fit of the spacer.  I then turned my attention to the L2-3 level.  With an assistant holding medial retraction of the traversing left L3 nerve, I did perform an annulotomy at the posterolateral aspect of the L2-3 intervertebral space.  I then used a series of curettes and pituitary rongeurs to perform a thorough and complete intervertebral diskectomy.  The intervertebral space was then liberally packed with autograft as well as allograft in the form of ViviGen, as was the appropriate-sized intervertebral spacer.  The spacer was then tamped into position in the usual fashion, and was then expanded to approximately 9 mm in height.  I was very pleased with the press-fit of the spacer.     I then placed 6 mm screws on the left at L2, L3, L4, and L5.  A 85-mm rod was then placed and caps were placed. The distraction was then released on the contralateral left side.  All 8 caps were then locked.  The wound was copiously irrigated with a total of approximately 3 L of normal saline prior to placing the bone graft.  Additional autograft and allograft were then packed into the posterolateral gutter on the right side to help aid in the success of the fusion.  The wound was  explored for any undue bleeding and there was minor bony bleeding noted throughout the surgical bed.  #15 deep Blake drain was placed.  Gel-Foam was placed over the laminectomy site.  The wound was then closed in  layers using #1 Vicryl followed by 2-0 Vicryl, followed by 4-0 Monocryl.  Benzoin and Steri-Strips were applied followed by sterile dressing.  #15 deep Blake drain was placed prior to closing the fascia.     Of note, Jason Coop, PA-C was my assistant throughout surgery, and did aid in retraction, placement of the hardware, suctioning, and closure.     Estill Bamberg, MD

## 2023-02-28 ENCOUNTER — Other Ambulatory Visit (HOSPITAL_COMMUNITY): Payer: Self-pay

## 2023-02-28 DIAGNOSIS — M5416 Radiculopathy, lumbar region: Secondary | ICD-10-CM | POA: Diagnosis not present

## 2023-02-28 MED ORDER — METHOCARBAMOL 500 MG PO TABS
500.0000 mg | ORAL_TABLET | Freq: Four times a day (QID) | ORAL | 2 refills | Status: AC | PRN
  Filled 2023-02-28: qty 30, 8d supply, fill #0
  Filled 2023-03-07: qty 30, 8d supply, fill #1
  Filled 2023-09-02: qty 30, 8d supply, fill #2

## 2023-02-28 MED ORDER — OXYCODONE-ACETAMINOPHEN 5-325 MG PO TABS
1.0000 | ORAL_TABLET | ORAL | 0 refills | Status: DC | PRN
Start: 2023-02-28 — End: 2023-06-12
  Filled 2023-02-28: qty 30, 3d supply, fill #0

## 2023-02-28 MED FILL — Thrombin For Soln 20000 Unit: CUTANEOUS | Qty: 1 | Status: AC

## 2023-02-28 NOTE — Evaluation (Signed)
Physical Therapy Evaluation/ Discharge Patient Details Name: Christina Griffith MRN: [id number] DOB: 27-Jun-1951 Today's Date: 02/28/2023  History of Present Illness  72 yo female admitted 7/24 for L2-5 fusion to relieve LLE pain. PMhx: endometrial CA, asthma, PNA, HTN, PVCs, bil THA  Clinical Impression  Pt pleasant and has a roommate who can assist with don/doffing brace and housework. Pt with small dog at home and reports being able to follow back precautions to care for dog as well. Pt educated for all precautions, brace wear, transfers and safety with pt able to demonstrate understanding of all education. Walking encouraged and no further therapy needs at this time with pt aware and agreeable, will sign off.         Assistance Recommended at Discharge PRN  If plan is discharge home, recommend the following:  Can travel by private vehicle  Assistance with cooking/housework        Equipment Recommendations None recommended by PT  Recommendations for Other Services       Functional Status Assessment Patient has not had a recent decline in their functional status     Precautions / Restrictions Precautions Precautions: Back Precaution Booklet Issued: Yes (comment) Required Braces or Orthoses: Spinal Brace Spinal Brace: Thoracolumbosacral orthotic;Applied in sitting position      Mobility  Bed Mobility Overal bed mobility: Modified Independent                  Transfers Overall transfer level: Modified independent                      Ambulation/Gait Ambulation/Gait assistance: Modified independent (Device/Increase time) Gait Distance (Feet): 400 Feet Assistive device: None Gait Pattern/deviations: WFL(Within Functional Limits), Decreased stride length   Gait velocity interpretation: >2.62 ft/sec, indicative of community ambulatory   General Gait Details: pt with good posture, decreased stride and good balance without assist during  gait  Stairs Stairs: Yes Stairs assistance: Modified independent (Device/Increase time) Stair Management: One rail Left, Step to pattern, Forwards Number of Stairs: 11 General stair comments: pt able to ascend flight of stairs with rail and step to pattern without assist or change in pain  Wheelchair Mobility     Tilt Bed    Modified Rankin (Stroke Patients Only)       Balance Overall balance assessment: No apparent balance deficits (not formally assessed)                                           Pertinent Vitals/Pain Pain Assessment Pain Assessment: 0-10 Pain Score: 2  Pain Location: incisional Pain Descriptors / Indicators: Sore Pain Intervention(s): Limited activity within patient's tolerance, Repositioned, Monitored during session    Home Living Family/patient expects to be discharged to:: Private residence Living Arrangements: Non-relatives/Friends Available Help at Discharge: Friend(s);Available 24 hours/day Type of Home: House Home Access: Stairs to enter Entrance Stairs-Rails: Left Entrance Stairs-Number of Steps: 2   Home Layout: Two level;Able to live on main level with bedroom/bathroom Home Equipment: Rollator (4 wheels);BSC/3in1;Cane - single point;Adaptive equipment      Prior Function Prior Level of Function : Independent/Modified Independent;Driving                     Hand Dominance        Extremity/Trunk Assessment   Upper Extremity Assessment Upper Extremity Assessment: Overall WFL for tasks assessed  Lower Extremity Assessment Lower Extremity Assessment: Overall WFL for tasks assessed    Cervical / Trunk Assessment Cervical / Trunk Assessment: Back Surgery  Communication   Communication: No difficulties  Cognition Arousal/Alertness: Awake/alert Behavior During Therapy: WFL for tasks assessed/performed Overall Cognitive Status: Within Functional Limits for tasks assessed                                           General Comments      Exercises     Assessment/Plan    PT Assessment Patient does not need any further PT services  PT Problem List         PT Treatment Interventions      PT Goals (Current goals can be found in the Care Plan section)  Acute Rehab PT Goals PT Goal Formulation: All assessment and education complete, DC therapy    Frequency       Co-evaluation               AM-PAC PT "6 Clicks" Mobility  Outcome Measure Help needed turning from your back to your side while in a flat bed without using bedrails?: None Help needed moving from lying on your back to sitting on the side of a flat bed without using bedrails?: None Help needed moving to and from a bed to a chair (including a wheelchair)?: None Help needed standing up from a chair using your arms (e.g., wheelchair or bedside chair)?: None Help needed to walk in hospital room?: None Help needed climbing 3-5 steps with a railing? : None 6 Click Score: 24    End of Session Equipment Utilized During Treatment: Back brace Activity Tolerance: Patient tolerated treatment well Patient left: in chair;with call bell/phone within reach Nurse Communication: Mobility status PT Visit Diagnosis: Other abnormalities of gait and mobility (R26.89)    Time: 8416-6063 PT Time Calculation (min) (ACUTE ONLY): 19 min   Charges:   PT Evaluation $PT Eval Low Complexity: 1 Low   PT General Charges $$ ACUTE PT VISIT: 1 Visit         Merryl Hacker, PT Acute Rehabilitation Services Office: [phone]   Christina Griffith 02/28/2023, 9:12 AM

## 2023-02-28 NOTE — Evaluation (Signed)
Occupational Therapy Evaluation Patient Details Name: Christina Griffith MRN: [id number] DOB: [birth year] Today's Date: 02/28/2023   History of Present Illness 72 yo female admitted 7/24 for L2-5 fusion to relieve LLE pain. PMhx: endometrial CA, asthma, PNA, HTN, PVCs, bil THA   Clinical Impression   All education completed as detailed below, reinforced with written handout. Pt verbalized understanding. Has all necessary DME and a friend to assist with IADLs. No further OT needs.     Recommendations for follow up therapy are one component of a multi-disciplinary discharge planning process, led by the attending physician.  Recommendations may be updated based on patient status, additional functional criteria and insurance authorization.   Assistance Recommended at Discharge PRN  Patient can return home with the following A little help with bathing/dressing/bathroom;Assistance with cooking/housework    Functional Status Assessment  Patient has had a recent decline in their functional status and demonstrates the ability to make significant improvements in function in a reasonable and predictable amount of time.  Equipment Recommendations  None recommended by OT    Recommendations for Other Services       Precautions / Restrictions Precautions Precautions: Back Precaution Booklet Issued: Yes (comment) Precaution Comments: reviewed back precautions related to ADLs, IADLs and bed mobility Required Braces or Orthoses: Spinal Brace Spinal Brace: Thoracolumbosacral orthotic;Applied in sitting position Restrictions Weight Bearing Restrictions: No      Mobility Bed Mobility               General bed mobility comments: in chair    Transfers Overall transfer level: Modified independent Equipment used: None                      Balance                                           ADL either performed or assessed with clinical judgement   ADL  Overall ADL's : Needs assistance/impaired Eating/Feeding: Independent   Grooming: Modified independent;Standing Grooming Details (indicate cue type and reason): educated in two cup method for oral care and washcloth on face Upper Body Bathing: Modified independent;Standing Upper Body Bathing Details (indicate cue type and reason): will use long bath sponge to avoid twisting washing back Lower Body Bathing: Modified independent;Sit to/from stand Lower Body Bathing Details (indicate cue type and reason): with long handled bath sponge Upper Body Dressing : Set up;Sitting   Lower Body Dressing: Modified independent;Sit to/from stand Lower Body Dressing Details (indicate cue type and reason): educated in use of reacher and sock aid for LB dressing Toilet Transfer: Modified Independent Toilet Transfer Details (indicate cue type and reason): plans to use 3 in 1 over toilet   Toileting - Clothing Manipulation Details (indicate cue type and reason): instructed to avoid twisting with pericare, use of tongs as needed     Functional mobility during ADLs: Modified independent General ADL Comments: Pt has a friend to assist with heavy housework.     Vision Baseline Vision/History: 1 Wears glasses Ability to See in Adequate Light: 0 Adequate       Perception     Praxis      Pertinent Vitals/Pain Pain Assessment Pain Assessment: Faces Faces Pain Scale: Hurts a little bit Pain Location: incisional Pain Descriptors / Indicators: Sore Pain Intervention(s): Repositioned     Hand Dominance Right   Extremity/Trunk Assessment Upper Extremity Assessment  Upper Extremity Assessment: Overall WFL for tasks assessed   Lower Extremity Assessment Lower Extremity Assessment: Defer to PT evaluation   Cervical / Trunk Assessment Cervical / Trunk Assessment: Back Surgery   Communication Communication Communication: No difficulties   Cognition Arousal/Alertness: Awake/alert Behavior During  Therapy: WFL for tasks assessed/performed Overall Cognitive Status: Within Functional Limits for tasks assessed                                       General Comments       Exercises     Shoulder Instructions      Home Living Family/patient expects to be discharged to:: Private residence Living Arrangements: Non-relatives/Friends Available Help at Discharge: Friend(s);Available 24 hours/day Type of Home: House Home Access: Stairs to enter [employer] of Steps: 2 Entrance Stairs-Rails: Left Home Layout: Two level;Able to live on main level with bedroom/bathroom     Bathroom Shower/Tub: Producer, television/film/video: Standard     Home Equipment: Rollator (4 wheels);BSC/3in1;Cane - single point;Adaptive equipment Adaptive Equipment: Reacher;Long-handled shoe horn        Prior Functioning/Environment Prior Level of Function : Independent/Modified Independent;Driving                        OT Problem List:        OT Treatment/Interventions:      OT Goals(Current goals can be found in the care plan section)    OT Frequency:      Co-evaluation              AM-PAC OT "6 Clicks" Daily Activity     Outcome Measure Help from another person eating meals?: None Help from another person taking care of personal grooming?: None Help from another person toileting, which includes using toliet, bedpan, or urinal?: None Help from another person bathing (including washing, rinsing, drying)?: None Help from another person to put on and taking off regular upper body clothing?: None Help from another person to put on and taking off regular lower body clothing?: A Little 6 Click Score: 23   End of Session Equipment Utilized During Treatment: Back brace  Activity Tolerance: Patient tolerated treatment well Patient left: in chair;with call bell/phone within reach  OT Visit Diagnosis: Pain                Time: 0833-0900 OT Time  Calculation (min): 27 min Charges:  OT General Charges $OT Visit: 1 Visit OT Evaluation $OT Eval Low Complexity: 1 Low OT Treatments $Self Care/Home Management : 8-22 mins  Berna Spare, OTR/L Acute Rehabilitation Services Office: [phone]   Evern Bio 02/28/2023, 9:53 AM

## 2023-02-28 NOTE — Progress Notes (Signed)
    Patient doing well  Patient reports resolved leg pain Has been ambulating   Physical Exam: Vitals:   02/27/23 2341 02/28/23 0435  BP: (!) 104/58 104/60  Pulse: 82 83  Resp: 16 15  Temp: 98.7 F (37.1 C) 98.6 F (37 C)  SpO2: 95% 94%    Dressing in place NVI  Drain: 100cc/10 hours  POD #1 s/p L2-L5 decompression and fusion, doing well  - up with PT/OT, encourage ambulation - Percocet for pain, Robaxin for muscle spasms - d/c home today with f/u in 2 weeks - d/c drain prior to discharge

## 2023-02-28 NOTE — Progress Notes (Signed)
Patient alert and oriented, void, ambulate, v/S stable. JP drain removed per order, dressing clean and dry. D/C instructions explain and given. Patient d/c home per order.

## 2023-03-01 ENCOUNTER — Encounter (HOSPITAL_COMMUNITY): Payer: Self-pay | Admitting: Orthopedic Surgery

## 2023-03-01 DIAGNOSIS — Z981 Arthrodesis status: Secondary | ICD-10-CM | POA: Diagnosis not present

## 2023-03-01 NOTE — Anesthesia Postprocedure Evaluation (Signed)
Anesthesia Post Note  Patient: Christina Griffith  Procedure(s) Performed: LEFT-SIDED LUMBAR 2- LUMBAR 3, LUMBAR 3- LUMBAR 4, LUMBAR 4- LUMBAR 5 TRANSFORAMINAL LUMBAR INTERBODY FUSION AND DECOMPRESSION WITH INSTRUMENTATION AND ALLOGRAFT (Left: Spine Lumbar)     Patient location during evaluation: PACU Anesthesia Type: General Level of consciousness: awake and alert Pain management: pain level controlled Vital Signs Assessment: post-procedure vital signs reviewed and stable Respiratory status: spontaneous breathing, nonlabored ventilation, respiratory function stable and patient connected to nasal cannula oxygen Cardiovascular status: blood pressure returned to baseline and stable Postop Assessment: no apparent nausea or vomiting Anesthetic complications: no   No notable events documented.  Last Vitals:  Vitals:   02/28/23 0435 02/28/23 0831  BP: 104/60 103/60  Pulse: 83 82  Resp: 15 16  Temp: 37 C 36.7 C  SpO2: 94% 95%    Last Pain:  Vitals:   02/28/23 0931  TempSrc:   PainSc: 6                  [name]

## 2023-03-06 DIAGNOSIS — G4733 Obstructive sleep apnea (adult) (pediatric): Secondary | ICD-10-CM | POA: Diagnosis not present

## 2023-03-06 MED FILL — Heparin Sodium (Porcine) Inj 1000 Unit/ML: INTRAMUSCULAR | Qty: 30 | Status: AC

## 2023-03-06 MED FILL — Sodium Chloride IV Soln 0.9%: INTRAVENOUS | Qty: 1000 | Status: AC

## 2023-03-06 NOTE — Discharge Summary (Signed)
Patient ID: Christina Griffith MRN: [id number] DOB/AGE: November 03, 1950 72 y.o.  Admit date: 02/27/2023 Discharge date: 02/28/2023  Admission Diagnoses:  Principal Problem:   Radiculopathy of lumbar region   Discharge Diagnoses:  Same  Past Medical History:  Diagnosis Date   Arthritis    spinal stenosis--back pain--hx of herniated cervical disk- but no surgery   Asthma    Cold 10/03/2012   pt getting over a cold--still has some head congestion, slight cough   Colon polyps    Endometrial ca [facility])    H/O: hematuria    benign essential hematuria - per urology work up    High cholesterol    History of depression    History of pneumonia    Hypertension    Numbness in right leg    pt. states occassional in right leg and right big toe from disc herniations   PAT (paroxysmal atrial tachycardia)    noted on heart monitor 07/2020   PONV (postoperative nausea and vomiting)    with prior hip replacement   PVC's (premature ventricular contractions)    PVCs, trigeminal PVCs, WCT nonsustained with PVC load 18% by montior 07/2020   Stress incontinence    Wears glasses     Surgeries: Procedure(s): LEFT-SIDED LUMBAR 2- LUMBAR 3, LUMBAR 3- LUMBAR 4, LUMBAR 4- LUMBAR 5 TRANSFORAMINAL LUMBAR INTERBODY FUSION AND DECOMPRESSION WITH INSTRUMENTATION AND ALLOGRAFT on 02/27/2023   Consultants: None  Discharged Condition: Improved  Hospital Course: Christina Griffith is an 72 y.o. female who was admitted 02/27/2023 for operative treatment of Radiculopathy of lumbar region. Patient has severe unremitting pain that affects sleep, daily activities, and work/hobbies. After pre-op clearance the patient was taken to the operating room on 02/27/2023 and underwent  Procedure(s): LEFT-SIDED LUMBAR 2- LUMBAR 3, LUMBAR 3- LUMBAR 4, LUMBAR 4- LUMBAR 5 TRANSFORAMINAL LUMBAR INTERBODY FUSION AND DECOMPRESSION WITH INSTRUMENTATION AND ALLOGRAFT.    Patient was given perioperative antibiotics:   Anti-infectives (From admission, onward)    Start     Dose/Rate Route Frequency Ordered Stop   02/27/23 1630  ceFAZolin (ANCEF) IVPB 2g/100 mL premix        2 g 200 mL/hr over 30 Minutes Intravenous Every 8 hours 02/27/23 1534 02/28/23 0741   02/27/23 0615  ceFAZolin (ANCEF) IVPB 2g/100 mL premix        2 g 200 mL/hr over 30 Minutes Intravenous On call to O.R. 02/27/23 0607 02/27/23 1229        Patient was given sequential compression devices, early ambulation to prevent DVT.  Patient benefited maximally from hospital stay and there were no complications.    Recent vital signs: BP 103/60 (BP Location: Right Arm)   Pulse 82   Temp 98 F (36.7 C) (Oral)   Resp 16   Ht 5\' 7"  (1.702 m)   Wt 75.3 kg   SpO2 95%   BMI 26.00 kg/m    Discharge Medications:   Allergies as of 02/28/2023   No Known Allergies      Medication List     TAKE these medications    albuterol 108 (90 Base) MCG/ACT inhaler Commonly known as: VENTOLIN HFA Inhale 2 puffs into the lungs every 6 (six) hours as needed for wheezing or shortness of breath.   atorvastatin 10 MG tablet Commonly known as: LIPITOR Take 1 tablet (10 mg total) by mouth daily. What changed: when to take this   Calcium Carb-Cholecalciferol 600-800 MG-UNIT Tabs Take 1 tablet by mouth daily.   fexofenadine  180 MG tablet Commonly known as: ALLEGRA Take 180 mg by mouth daily as needed for allergies or rhinitis.   losartan-hydrochlorothiazide 100-25 MG tablet Commonly known as: HYZAAR Take 1 tablet by mouth daily.   magnesium gluconate 500 MG tablet Commonly known as: MAGONATE Take 500 mg by mouth at bedtime.   methocarbamol 500 MG tablet Commonly known as: ROBAXIN Take 1 tablet (500 mg total) by mouth every 6 (six) hours as needed for muscle spasms.   metoprolol succinate 50 MG 24 hr tablet Commonly known as: TOPROL-XL Take 1&1/2 tablets (75 mg total) by mouth in the morning and at bedtime. Take with or immediately  following a meal. What changed:  how much to take when to take this additional instructions   multivitamin tablet Take 1 tablet by mouth daily.   NASACORT ALLERGY 24HR NA Place 1 spray into the nose daily as needed (allergies).   oxyCODONE-acetaminophen 5-325 MG tablet Commonly known as: PERCOCET/ROXICET Take 1-2 tablets by mouth every 4 (four) hours as needed for severe pain.   polyethylene glycol powder 17 GM/SCOOP powder Commonly known as: GLYCOLAX/MIRALAX Take 17 g by mouth daily as needed (constipation).   venlafaxine XR 75 MG 24 hr capsule Commonly known as: EFFEXOR-XR Take 1 capsule (75 mg total) by mouth daily. What changed: when to take this        Diagnostic Studies: DG Lumbar Spine 2-3 Views  Result Date: 02/27/2023 CLINICAL DATA:  Lumbar fusion EXAM: LUMBAR SPINE - 2-3 VIEW COMPARISON:  MRI from 09/06/2022 FLUOROSCOPY TIME:  Radiation Exposure Index (as provided by the fluoroscopic device): 63.69 mGy If the device does not provide the exposure index: Fluoroscopy Time:  1 minute 55 seconds Number of Acquired Images:  3 FINDINGS: Images demonstrate multilevel interbody fusion from L2-L5. Posterior fixation with pedicle screws is seen. IMPRESSION: Intraoperative fusion from L2-L5. Electronically Signed   By: Alcide Clever M.D.   On: 02/27/2023 17:06   DG Lumbar Spine 1 View  Result Date: 02/27/2023 CLINICAL DATA:  Intraoperative localization for lumbar fusion EXAM: LUMBAR SPINE - 1 VIEW COMPARISON:  09/06/2022 FINDINGS: Initial images demonstrate needles in the soft tissues posterior to the L2 spinous process and the L5 spinous process. Numbering nomenclature is similar to that utilized on prior MRI. Multilevel disc space narrowing and osteophytic changes are seen. IMPRESSION: Intraoperative localization as described. Electronically Signed   By: Alcide Clever M.D.   On: 02/27/2023 17:03   DG C-Arm 1-60 Min-No Report  Result Date: 02/27/2023 Fluoroscopy was utilized by  the requesting physician.  No radiographic interpretation.   DG C-Arm 1-60 Min-No Report  Result Date: 02/27/2023 Fluoroscopy was utilized by the requesting physician.  No radiographic interpretation.   DG C-Arm 1-60 Min-No Report  Result Date: 02/27/2023 Fluoroscopy was utilized by the requesting physician.  No radiographic interpretation.   DG C-Arm 1-60 Min-No Report  Result Date: 02/27/2023 Fluoroscopy was utilized by the requesting physician.  No radiographic interpretation.   DG C-Arm 1-60 Min-No Report  Result Date: 02/27/2023 Fluoroscopy was utilized by the requesting physician.  No radiographic interpretation.   DG C-Arm 1-60 Min-No Report  Result Date: 02/27/2023 Fluoroscopy was utilized by the requesting physician.  No radiographic interpretation.    Disposition: Discharge disposition: 01-Home or Self Care        POD #1 s/p L2-L5 decompression and fusion, doing well   - up with PT/OT, encourage ambulation - Percocet for pain, Robaxin for muscle spasms - d/c drain prior to discharge -Scripts for  pain sent to pharmacy electronically  -D/C instructions sheet printed and in chart -D/C today  -F/U in office 2 weeks   Signed: Eilene Ghazi Daiton Cowles 03/06/2023, 11:30 AM

## 2023-03-07 ENCOUNTER — Other Ambulatory Visit (HOSPITAL_COMMUNITY): Payer: Self-pay

## 2023-03-08 ENCOUNTER — Encounter: Payer: Self-pay | Admitting: Cardiology

## 2023-03-08 ENCOUNTER — Ambulatory Visit: Payer: PPO | Attending: Cardiology | Admitting: Cardiology

## 2023-03-08 VITALS — BP 132/78 | HR 90 | Temp 98.1°F | Ht 67.0 in | Wt 163.0 lb

## 2023-03-08 DIAGNOSIS — I4719 Other supraventricular tachycardia: Secondary | ICD-10-CM | POA: Diagnosis not present

## 2023-03-08 DIAGNOSIS — I10 Essential (primary) hypertension: Secondary | ICD-10-CM | POA: Diagnosis not present

## 2023-03-08 DIAGNOSIS — G4733 Obstructive sleep apnea (adult) (pediatric): Secondary | ICD-10-CM | POA: Diagnosis not present

## 2023-03-08 DIAGNOSIS — I493 Ventricular premature depolarization: Secondary | ICD-10-CM

## 2023-03-08 NOTE — Progress Notes (Signed)
Virtual Visit via Video Note   Because of Christina Griffith's co-morbid illnesses, she is at least at moderate risk for complications without adequate follow up.  This format is felt to be most appropriate for this patient at this time.  All issues noted in this document were discussed and addressed.  A limited physical exam was performed with this format.  Please refer to the patient's chart for her consent to telehealth for [facility].   Date:  03/08/2023   ID:  Christina Griffith, Christina Griffith 01/14/1951, MRN [id number] The patient was identified using 2 identifiers.  Patient Location: Home Provider Location: Home Office   PCP:  Excell Seltzer, MD   [facility] Providers Cardiologist:  Armanda Magic, MD Electrophysiologist:  Lanier Prude, MD     Evaluation Performed:  Follow-Up Visit  Chief Complaint:  PVCs  History of Present Illness:    Christina Griffith is a 72 y.o. female with a hx of asthma, HTN, Palpitations with PVCs and HTN. 2D echo showed low normal LVF with EF 50-55% with G1DD, mildly enlarged RA and LA, mild MR.  Lexiscan myoview done showing no ischemia. She was started on Toprol XL 25mg  daily for suppression of PVCs.  Cardiac MRI showed normal LVF with EF 56% with no LGE, normal RV, mild MR and TR.    2 week ziopatch showed frequent PVCs, trigeminal PVCs and nonsustained wide complex tachycardia up to 5 beats in a row with PVC load 18%.  SHe also had nonsustained atrial tachycardia up to 16 beats in a row as fast ad 235bpm. Her Toprol XL was increased to 50mg  daily and she was referred to EP.  AADT with Flecainide was discussed but her palpitations had improved with Toprol and medical therapy was continued.  Her Toprol was increased further to 75mg  BID due to reoccurrence of palpitations.    At last OV she was complaining of excessive daytime sleepiness and HST showed an AHI of 10.2/hr and she was started on auto CPAP from 4 to 15cm H2O.    She is  doing well with her PAP device and thinks that she has gotten used to it.  She tolerates the nasal cushion mask and feels the pressure is adequate.  Since going on PAP she feels rested in the am and has no significant daytime sleepiness.  She denies any significant mouth or nasal dryness or nasal congestion.  She does not think that he snores.     She is here today for followup and is doing well.  She denies any chest pain or pressure, SOB, DOE, PND, orthopnea, LE edema, dizziness, palpitations or syncope. She is compliant with her meds and is tolerating meds with no SE.    Past Medical History:  Diagnosis Date   Arthritis    spinal stenosis--back pain--hx of herniated cervical disk- but no surgery   Asthma    Cold 10/03/2012   pt getting over a cold--still has some head congestion, slight cough   Colon polyps    Endometrial ca [facility])    H/O: hematuria    benign essential hematuria - per urology work up    High cholesterol    History of depression    History of pneumonia    Hypertension    Numbness in right leg    pt. states occassional in right leg and right big toe from disc herniations   PAT (paroxysmal atrial tachycardia)    noted on heart monitor  07/2020   PONV (postoperative nausea and vomiting)    with prior hip replacement   PVC's (premature ventricular contractions)    PVCs, trigeminal PVCs, WCT nonsustained with PVC load 18% by montior 07/2020   Stress incontinence    Wears glasses    Past Surgical History:  Procedure Laterality Date   BREAST SURGERY Right 1980's   breast biopsy   COLONOSCOPY     DENTAL SURGERY  11/2019   EYE SURGERY Bilateral 02/2015   cataracts   HYSTEROSCOPY WITH D & C Left 1/24/204   w/ resection of Endometrial polyps   ROBOTIC ASSISTED TOTAL HYSTERECTOMY WITH BILATERAL SALPINGO OOPHERECTOMY N/A 10/07/2012   Procedure: ROBOTIC ASSISTED TOTAL HYSTERECTOMY WITH BILATERAL SALPINGO OOPHORECTOMY/POSSIBLE LYMPH NODE BIOPSY;  Surgeon: Rejeana Brock A. Duard Brady,  MD;  Location: [facility];  Service: Gynecology;  Laterality: N/A;   TOTAL HIP ARTHROPLASTY     right 2002 left 2004   TOTAL HIP ARTHROPLASTY Left 2004   TOTAL HIP REVISION Right 05/09/2015   Procedure: TOTAL HIP REVISION;  Surgeon: Gean Birchwood, MD;  Location: [facility] OR;  Service: Orthopedics;  Laterality: Right;   TRANSFORAMINAL LUMBAR INTERBODY FUSION (TLIF) WITH PEDICLE SCREW FIXATION 4 LEVEL Left 02/27/2023   Procedure: LEFT-SIDED LUMBAR 2- LUMBAR 3, LUMBAR 3- LUMBAR 4, LUMBAR 4- LUMBAR 5 TRANSFORAMINAL LUMBAR INTERBODY FUSION AND DECOMPRESSION WITH INSTRUMENTATION AND ALLOGRAFT;  Surgeon: Estill Bamberg, MD;  Location: [facility] OR;  Service: Orthopedics;  Laterality: Left;     Current Meds  Medication Sig   albuterol (PROVENTIL HFA;VENTOLIN HFA) 108 (90 Base) MCG/ACT inhaler Inhale 2 puffs into the lungs every 6 (six) hours as needed for wheezing or shortness of breath.   atorvastatin (LIPITOR) 10 MG tablet Take 1 tablet (10 mg total) by mouth daily. (Patient taking differently: Take 10 mg by mouth every evening.)   Calcium Carb-Cholecalciferol 600-800 MG-UNIT TABS Take 1 tablet by mouth daily.   fexofenadine (ALLEGRA) 180 MG tablet Take 180 mg by mouth daily as needed for allergies or rhinitis.   losartan-hydrochlorothiazide (HYZAAR) 100-25 MG tablet Take 1 tablet by mouth daily.   magnesium gluconate (MAGONATE) 500 MG tablet Take 500 mg by mouth at bedtime.   methocarbamol (ROBAXIN) 500 MG tablet Take 1 tablet (500 mg total) by mouth every 6 (six) hours as needed for muscle spasms.   metoprolol succinate (TOPROL-XL) 50 MG 24 hr tablet Take 1&1/2 tablets (75 mg total) by mouth in the morning and at bedtime. Take with or immediately following a meal. (Patient taking differently: Take 50-75 mg by mouth See admin instructions. 50 mg in the morning, 75 mg at bedtime)   Multiple Vitamin (MULTIVITAMIN) tablet Take 1 tablet by mouth daily.   oxyCODONE-acetaminophen (PERCOCET/ROXICET) 5-325 MG tablet Take 1-2  tablets by mouth every 4 (four) hours as needed for severe pain.   polyethylene glycol powder (GLYCOLAX/MIRALAX) 17 GM/SCOOP powder Take 17 g by mouth daily as needed (constipation).   Triamcinolone Acetonide (NASACORT ALLERGY 24HR NA) Place 1 spray into the nose daily as needed (allergies).   venlafaxine XR (EFFEXOR-XR) 75 MG 24 hr capsule Take 1 capsule (75 mg total) by mouth daily. (Patient taking differently: Take 75 mg by mouth at bedtime.)     Allergies:   Patient has no known allergies.   Social History   Tobacco Use   Smoking status: Never   Smokeless tobacco: Never  Vaping Use   Vaping status: Never Used  Substance Use Topics   Alcohol use: Yes    Alcohol/week: 3.0 standard  drinks of alcohol    Types: 3 Glasses of wine per week    Comment: 3 x a week   Drug use: No     Family Hx: The patient's family history includes Breast cancer in her cousin; Cancer in her father and maternal grandmother; Hyperlipidemia in her father; Hypertension in her mother.  ROS:   Please see the history of present illness.     All other systems reviewed and are negative.   Prior CV studies:   The following studies were reviewed today:  PAP compliance download  Labs/Other Tests and Data Reviewed:    EKG:  No ECG reviewed.  Recent Labs: 06/06/2022: TSH 0.74 09/26/2022: ALT 34 02/19/2023: BUN 19; Creatinine, Ser 0.75; Hemoglobin 13.3; Platelets 264; Potassium 3.5; Sodium 136   Recent Lipid Panel Lab Results  Component Value Date/Time   CHOL 193 09/26/2022 07:41 AM   TRIG 186.0 (H) 09/26/2022 07:41 AM   HDL 57.50 09/26/2022 07:41 AM   CHOLHDL 3 09/26/2022 07:41 AM   LDLCALC 98 09/26/2022 07:41 AM   LDLDIRECT 104.0 05/18/2022 07:37 AM    Wt Readings from Last 3 Encounters:  03/08/23 163 lb (73.9 kg)  02/27/23 166 lb (75.3 kg)  02/19/23 166 lb (75.3 kg)     Risk Assessment/Calculations:      STOP-Bang Score:  3      Objective:    Well nourished, well developed female in no  acute distress. Well appearing, alert and conversant, regular work of breathing,  good skin color  Eyes- anicteric mouth- oral mucosa is pink  neuro- grossly intact skin- no apparent rash or lesions or cyanosis ASSESSMENT & PLAN:    Palpitations/PVCs -2 week ziopatch  showed frequent PVCs with PVC load 18% with NSVT and trigeminal PVCs -seen by EP and discussed AADT vs.PVC ablation but her palpitations had significantly improved on higher dose of BB -cardiac MRI showed normal LVF and RVF with no LGE  -TSH and Hbg were normal  -Lexiscan myoview with no ischemia -Her palpitations have been very well-controlled on Toprol-XL 50 mg every morning and 75 mg every afternoon  2.  Paroxysmal atrial tachycardia -She denies any recent palpitations  -continue Toprol XL 50 mg every morning and 75 mg every afternoon with as needed refills    3.  HTN -BP has been controlled at home -Continue prescription drug management with losartan HCT 100-25 mg daily, Toprol-XL 50 mg every morning 75 mg every afternoon with as needed refill\ -I have personally reviewed and interpreted outside labs performed by patient's PCP which showed serum creatinine 0.75 and potassium 3.5 on 02/19/2023  4.  OSA - The patient is tolerating PAP therapy well without any problems. The PAP download performed by his DME was personally reviewed and interpreted by me today and showed an AHI of 2.8 /hr on auto CPAP from 4-15 cm H2O with 90% compliance in using more than 4 hours nightly.  The patient has been using and benefiting from PAP use and will continue to benefit from therapy.   Followup with me in 1 year   Time:   Today, I have spent 15 minutes with the patient with telehealth technology discussing the above problems.     Medication Adjustments/Labs and Tests Ordered: Current medicines are reviewed at length with the patient today.  Concerns regarding medicines are outlined above.   Tests Ordered: No orders of the defined  types were placed in this encounter.   Medication Changes: No orders of the defined types  were placed in this encounter.   Follow Up:  In Person in 1 year(s)  Signed, Armanda Magic, MD  03/08/2023 9:22 AM    [facility]

## 2023-03-08 NOTE — Patient Instructions (Signed)
Medication Instructions:  Your physician recommends that you continue on your current medications as directed. Please refer to the Current Medication list given to you today.  *If you need a refill on your cardiac medications before your next appointment, please call your pharmacy*   Lab Work: None.  If you have labs (blood work) drawn today and your tests are completely normal, you will receive your results only by: MyChart Message (if you have MyChart) OR A paper copy in the mail If you have any lab test that is abnormal or we need to change your treatment, we will call you to review the results.   Testing/Procedures: None.   Follow-Up:   Your next appointment:   1 year(s)  Provider:   Traci Turner, MD     

## 2023-03-13 ENCOUNTER — Other Ambulatory Visit (HOSPITAL_COMMUNITY): Payer: Self-pay

## 2023-03-13 MED ORDER — CIPROFLOXACIN HCL 500 MG PO TABS
500.0000 mg | ORAL_TABLET | Freq: Every day | ORAL | 0 refills | Status: DC
  Filled 2023-03-13: qty 3, 3d supply, fill #0

## 2023-04-04 DIAGNOSIS — G4733 Obstructive sleep apnea (adult) (pediatric): Secondary | ICD-10-CM | POA: Diagnosis not present

## 2023-04-06 DIAGNOSIS — G4733 Obstructive sleep apnea (adult) (pediatric): Secondary | ICD-10-CM | POA: Diagnosis not present

## 2023-04-11 ENCOUNTER — Other Ambulatory Visit: Payer: Self-pay | Admitting: Family Medicine

## 2023-04-11 ENCOUNTER — Other Ambulatory Visit (HOSPITAL_COMMUNITY): Payer: Self-pay

## 2023-04-11 MED ORDER — LOSARTAN POTASSIUM-HCTZ 100-25 MG PO TABS
1.0000 | ORAL_TABLET | Freq: Every day | ORAL | 0 refills | Status: DC
  Filled 2023-04-11: qty 90, 90d supply, fill #0

## 2023-04-12 ENCOUNTER — Other Ambulatory Visit (HOSPITAL_COMMUNITY): Payer: Self-pay

## 2023-04-12 DIAGNOSIS — M545 Low back pain, unspecified: Secondary | ICD-10-CM | POA: Diagnosis not present

## 2023-04-16 ENCOUNTER — Ambulatory Visit (INDEPENDENT_AMBULATORY_CARE_PROVIDER_SITE_OTHER): Payer: PPO

## 2023-04-16 VITALS — BP 104/62 | HR 84 | Ht 67.0 in | Wt 166.8 lb

## 2023-04-16 DIAGNOSIS — Z0000 Encounter for general adult medical examination without abnormal findings: Secondary | ICD-10-CM

## 2023-04-16 NOTE — Addendum Note (Signed)
Addended by: Maryan Puls on: 04/16/2023 02:44 PM   Modules accepted: Level of Service

## 2023-04-16 NOTE — Progress Notes (Addendum)
Subjective:   Christina Griffith is a 72 y.o. female who presents for Medicare Annual (Subsequent) preventive examination.  Visit Complete: In person  Patient Medicare AWV questionnaire was completed by the patient on 04/15/23; I have confirmed that all information answered by patient is correct and no changes since this date.   Review of Systems      Cardiac Risk Factors include: sedentary lifestyle;advanced age (>1men, >26 women);hypertension     Objective:    Today's Vitals   04/16/23 1343  BP: 104/62  Pulse: 84  SpO2: 96%  Weight: 166 lb 12.8 oz (75.7 kg)  Height: 5\' 7"  (1.702 m)  PainSc: 2    Body mass index is 26.12 kg/m.     04/16/2023    1:53 PM 02/27/2023    6:19 AM 02/19/2023    9:23 AM 05/17/2022   10:04 AM 04/26/2020    8:25 AM 04/24/2019   10:05 AM 07/01/2018    1:49 PM  Advanced Directives  Does Patient Have a Medical Advance Directive? Yes Yes Yes Yes Yes Yes Yes  Type of Estate agent of [place];Living will Healthcare Power of [employer] of [place];Living will Healthcare Power of [place];Living will Healthcare Power of [place];Living will Healthcare Power of [place];Living will Living will  Does patient want to make changes to medical advance directive? No - Patient declined  No - Patient declined    No - Patient declined  Copy of Healthcare Power of Attorney in Chart? Yes - validated most recent copy scanned in chart (See row information)  Yes - validated most recent copy scanned in chart (See row information) Yes - validated most recent copy scanned in chart (See row information) Yes - validated most recent copy scanned in chart (See row information) Yes - validated most recent copy scanned in chart (See row information)     Current Medications (verified) Outpatient Encounter Medications as of 04/16/2023  Medication Sig   albuterol (PROVENTIL HFA;VENTOLIN HFA) 108 (90 Base) MCG/ACT inhaler Inhale 2 puffs into the  lungs every 6 (six) hours as needed for wheezing or shortness of breath.   atorvastatin (LIPITOR) 10 MG tablet Take 1 tablet (10 mg total) by mouth daily. (Patient taking differently: Take 10 mg by mouth every evening.)   Calcium Carb-Cholecalciferol 600-800 MG-UNIT TABS Take 1 tablet by mouth daily.   fexofenadine (ALLEGRA) 180 MG tablet Take 180 mg by mouth daily as needed for allergies or rhinitis.   losartan-hydrochlorothiazide (HYZAAR) 100-25 MG tablet Take 1 tablet by mouth daily.   magnesium gluconate (MAGONATE) 500 MG tablet Take 500 mg by mouth at bedtime.   methocarbamol (ROBAXIN) 500 MG tablet Take 1 tablet (500 mg total) by mouth every 6 (six) hours as needed for muscle spasms.   metoprolol succinate (TOPROL-XL) 50 MG 24 hr tablet Take 1&1/2 tablets (75 mg total) by mouth in the morning and at bedtime. Take with or immediately following a meal. (Patient taking differently: Take 50-75 mg by mouth See admin instructions. 50 mg in the morning, 75 mg at bedtime)   Multiple Vitamin (MULTIVITAMIN) tablet Take 1 tablet by mouth daily.   polyethylene glycol powder (GLYCOLAX/MIRALAX) 17 GM/SCOOP powder Take 17 g by mouth daily as needed (constipation).   Triamcinolone Acetonide (NASACORT ALLERGY 24HR NA) Place 1 spray into the nose daily as needed (allergies).   venlafaxine XR (EFFEXOR-XR) 75 MG 24 hr capsule Take 1 capsule (75 mg total) by mouth daily. (Patient taking differently: Take 75 mg by mouth  at bedtime.)   ciprofloxacin (CIPRO) 500 MG tablet Take 1 tablet (500 mg total) by mouth daily for UTI   oxyCODONE-acetaminophen (PERCOCET/ROXICET) 5-325 MG tablet Take 1-2 tablets by mouth every 4 (four) hours as needed for severe pain.   No facility-administered encounter medications on file as of 04/16/2023.    Allergies (verified) Patient has no known allergies.   History: Past Medical History:  Diagnosis Date   Arthritis    spinal stenosis--back pain--hx of herniated cervical disk- but  no surgery   Asthma    Cold 10/03/2012   pt getting over a cold--still has some head congestion, slight cough   Colon polyps    Endometrial ca [facility])    H/O: hematuria    benign essential hematuria - per urology work up    High cholesterol    History of back surgery 02/27/2023   History of depression    History of pneumonia    Hypertension    Numbness in right leg    pt. states occassional in right leg and right big toe from disc herniations   PAT (paroxysmal atrial tachycardia)    noted on heart monitor 07/2020   PONV (postoperative nausea and vomiting)    with prior hip replacement   PVC's (premature ventricular contractions)    PVCs, trigeminal PVCs, WCT nonsustained with PVC load 18% by montior 07/2020   Stress incontinence    Wears glasses    Past Surgical History:  Procedure Laterality Date   BREAST SURGERY Right 1980's   breast biopsy   COLONOSCOPY     DENTAL SURGERY  11/2019   EYE SURGERY Bilateral 02/2015   cataracts   HYSTEROSCOPY WITH D & C Left 1/24/204   w/ resection of Endometrial polyps   ROBOTIC ASSISTED TOTAL HYSTERECTOMY WITH BILATERAL SALPINGO OOPHERECTOMY N/A 10/07/2012   Procedure: ROBOTIC ASSISTED TOTAL HYSTERECTOMY WITH BILATERAL SALPINGO OOPHORECTOMY/POSSIBLE LYMPH NODE BIOPSY;  Surgeon: Rejeana Brock A. Duard Brady, MD;  Location: [facility];  Service: Gynecology;  Laterality: N/A;   TOTAL HIP ARTHROPLASTY     right 2002 left 2004   TOTAL HIP ARTHROPLASTY Left 2004   TOTAL HIP REVISION Right 05/09/2015   Procedure: TOTAL HIP REVISION;  Surgeon: Gean Birchwood, MD;  Location: [facility] OR;  Service: Orthopedics;  Laterality: Right;   TRANSFORAMINAL LUMBAR INTERBODY FUSION (TLIF) WITH PEDICLE SCREW FIXATION 4 LEVEL Left 02/27/2023   Procedure: LEFT-SIDED LUMBAR 2- LUMBAR 3, LUMBAR 3- LUMBAR 4, LUMBAR 4- LUMBAR 5 TRANSFORAMINAL LUMBAR INTERBODY FUSION AND DECOMPRESSION WITH INSTRUMENTATION AND ALLOGRAFT;  Surgeon: Estill Bamberg, MD;  Location: [facility] OR;  Service: Orthopedics;   Laterality: Left;   Family History  Problem Relation Age of Onset   Hypertension Mother    Cancer Father        bladder and prostate   Hyperlipidemia Father    Cancer Maternal Grandmother        colon   Breast cancer Cousin    Social History   Socioeconomic History   Marital status: Single    Spouse name: Not on file   Number of children: Not on file   Years of education: Not on file   Highest education level: Not on file  Occupational History   Occupation: Freight forwarder    Employer: [employer]  Tobacco Use   Smoking status: Never   Smokeless tobacco: Never  Vaping Use   Vaping status: Never Used  Substance and Sexual Activity   Alcohol use: Yes    Alcohol/week: 3.0 standard drinks  of alcohol    Types: 3 Glasses of wine per week    Comment: 3 x a week   Drug use: No   Sexual activity: Not on file  Other Topics Concern   Not on file  Social History Narrative   Regular exercise- yes, 5-6 days a week, yoga   Diet: healthy   Social Determinants of Health   Financial Resource Strain: Low Risk  (04/15/2023)   Overall Financial Resource Strain (CARDIA)    Difficulty of Paying Living Expenses: Not hard at all  Food Insecurity: No Food Insecurity (04/15/2023)   Hunger Vital Sign    Worried About Running Out of Food in the Last Year: Never true    Ran Out of Food in the Last Year: Never true  Transportation Needs: No Transportation Needs (04/15/2023)   PRAPARE - Administrator, Civil Service (Medical): No    Lack of Transportation (Non-Medical): No  Physical Activity: Sufficiently Active (04/15/2023)   Exercise Vital Sign    Days of Exercise per Week: 7 days    Minutes of Exercise per Session: 60 min  Stress: No Stress Concern Present (04/15/2023)   [employer] of Occupational Health - Occupational Stress Questionnaire    Feeling of Stress : Only a little  Social Connections: Moderately Integrated (04/15/2023)   Social Connection and Isolation  Panel [NHANES]    Frequency of Communication with Friends and Family: More than three times a week    Frequency of Social Gatherings with Friends and Family: More than three times a week    Attends Religious Services: More than 4 times per year    Active Member of [employer] or Organizations: Yes    Attends Engineer, structural: More than 4 times per year    Marital Status: Never married  Recent Concern: Social Connections - Moderately Isolated (04/15/2023)   Social Connection and Isolation Panel [NHANES]    Frequency of Communication with Friends and Family: Never    Frequency of Social Gatherings with Friends and Family: Once a week    Attends Religious Services: More than 4 times per year    Active Member of [employer] or Organizations: Yes    Attends Engineer, structural: More than 4 times per year    Marital Status: Never married    Tobacco Counseling Counseling given: Not Answered   Clinical Intake:  Pre-visit preparation completed: Yes  Pain : 0-10 Pain Score: 2  Pain Location: Generalized (back and hands) Pain Descriptors / Indicators: Aching Pain Onset: More than a month ago     BMI - recorded: 26.12 Nutritional Status: BMI 25 -29 Overweight Nutritional Risks: None Diabetes: No  How often do you need to have someone help you when you read instructions, pamphlets, or other written materials from your doctor or pharmacy?: 1 - Never  Interpreter Needed?: No  Information entered by :: C.Temitope Griffing LPN   Activities of Daily Living    04/15/2023   12:51 PM 02/19/2023    9:27 AM  In your present state of health, do you have any difficulty performing the following activities:  Hearing? 0   Vision? 0   Difficulty concentrating or making decisions? 0   Walking or climbing stairs? 0   Dressing or bathing? 0   Doing errands, shopping? 0 0  Preparing Food and eating ? N   Using the Toilet? N   In the past six months, have you accidently leaked urine? Y    Comment wears  a pad   Do you have problems with loss of bowel control? N   Managing your Medications? N   Managing your Finances? N   Housekeeping or managing your Housekeeping? N     Patient Care Team: Excell Seltzer, MD as PCP - General (Family Medicine) Quintella Reichert, MD as PCP - Cardiology (Cardiology) Lanier Prude, MD as PCP - Electrophysiology (Cardiology)  Indicate any recent Medical Services you may have received from other than [facility] providers in the past year (date may be approximate).     Assessment:   This is a routine wellness examination for [place].  Hearing/Vision screen Hearing Screening - Comments:: Decreased hearing with background noises Vision Screening - Comments:: Glasses - [facility] - last seen in April   Goals Addressed             This Visit's Progress    Patient Stated       Rebuild strength       Depression Screen    04/16/2023    1:47 PM 05/17/2022   10:14 AM 04/29/2021    9:15 AM 04/26/2020    8:28 AM 04/24/2019   10:06 AM 04/18/2018    9:14 AM 04/05/2017    9:15 AM  PHQ 2/9 Scores  PHQ - 2 Score 0 0 0 0 0 0 0  PHQ- 9 Score  1  0 0 0 0    Fall Risk    04/15/2023   12:51 PM 05/17/2022   10:08 AM 04/29/2021    9:24 AM 04/26/2020    8:26 AM 04/24/2019   10:06 AM  Fall Risk   Falls in the past year? 0 0 0 1 0  Comment    tripped on stairs   Number falls in past yr: 0 0 0 1 0  Injury with Fall? 0 0 0 0   Risk for fall due to : No Fall Risks   History of fall(s) Medication side effect  Follow up Falls prevention discussed;Falls evaluation completed Falls evaluation completed;Education provided;Falls prevention discussed  Falls evaluation completed;Falls prevention discussed Falls evaluation completed;Falls prevention discussed    MEDICARE RISK AT HOME: Medicare Risk at Home Any stairs in or around the home?: Yes If so, are there any without handrails?: Yes Home free of loose throw rugs in walkways, pet beds,  electrical cords, etc?: No Adequate lighting in your home to reduce risk of falls?: Yes Life alert?: No Use of a cane, walker or w/c?: No Grab bars in the bathroom?: No Shower chair or bench in shower?: Yes Elevated toilet seat or a handicapped toilet?: Yes  TIMED UP AND GO:  Was the test performed?  Yes  Length of time to ambulate 10 feet: 11 sec Gait steady and fast without use of assistive device    Cognitive Function:    04/26/2020    8:32 AM 04/24/2019   10:11 AM 04/18/2018    9:15 AM 04/05/2017    9:20 AM  MMSE - Mini Mental State Exam  Orientation to time 5 5 5 5   Orientation to Place 5 5 5 5   Registration 3 3 3 3   Attention/ Calculation 5 5 0 0  Recall 3 3 3 3   Language- name 2 objects   0 0  Language- repeat 1 1 1 1   Language- follow 3 step command   3 3  Language- read & follow direction   0 0  Write a sentence   0 0  Copy design  0 0  Total score   20 20        04/16/2023    1:56 PM 05/17/2022   10:06 AM 04/29/2021    9:26 AM  6CIT Screen  What Year? 0 points 0 points 0 points  What month? 0 points 0 points 0 points  What time? 0 points 0 points 0 points  Count back from 20 0 points 0 points 0 points  Months in reverse 0 points 0 points 0 points  Repeat phrase 0 points 0 points 0 points  Total Score 0 points 0 points 0 points    Immunizations Immunization History  Administered Date(s) Administered   Fluad Quad(high Dose 65+) 05/05/2020, 05/11/2022   Influenza Whole 06/06/2010   Influenza, High Dose Seasonal PF 05/08/2021   Influenza,inj,Quad PF,6+ Mos 04/15/2015, 04/30/2016, 04/18/2018, 04/17/2019   PFIZER Comirnaty(Gray Top)Covid-19 Tri-Sucrose Vaccine 05/11/2022   PFIZER(Purple Top)SARS-COV-2 Vaccination 08/20/2019, 09/09/2019, 05/14/2020, 02/03/2021   Pfizer Covid-19 Vaccine Bivalent Booster 34yrs & up 05/08/2021   Pneumococcal Conjugate-13 03/16/2014   Pneumococcal Polysaccharide-23 12/14/2011, 04/05/2017   Td 08/29/2007   Tdap 07/15/2018    Zoster Recombinant(Shingrix) 10/26/2020, 01/13/2021   Zoster, Live 11/24/2010    TDAP status: Up to date  Flu Vaccine status: Due, Education has been provided regarding the importance of this vaccine. Advised may receive this vaccine at local pharmacy or Health Dept. Aware to provide a copy of the vaccination record if obtained from local pharmacy or Health Dept. Verbalized acceptance and understanding.  Pneumococcal vaccine status: Up to date  Covid-19 vaccine status: Information provided on how to obtain vaccines.   Qualifies for Shingles Vaccine? Yes   Zostavax completed Yes   Shingrix Completed?: Yes  Screening Tests Health Maintenance  Topic Date Due   PAP SMEAR-Modifier  05/23/2022   COVID-19 Vaccine (7 - 2023-24 season) 08/06/2023 (Originally 04/07/2023)   INFLUENZA VACCINE  11/04/2023 (Originally 03/07/2023)   MAMMOGRAM  10/11/2023   Medicare Annual Wellness (AWV)  04/15/2024   DTaP/Tdap/Td (3 - Td or Tdap) 07/15/2028   Pneumonia Vaccine 73+ Years old  Completed   DEXA SCAN  Completed   Hepatitis C Screening  Completed   Zoster Vaccines- Shingrix  Completed   HPV VACCINES  Aged Out   Colonoscopy  Discontinued    Health Maintenance  Health Maintenance Due  Topic Date Due   PAP SMEAR-Modifier  05/23/2022    Colorectal cancer screening: No longer required.   Mammogram status: Completed 10/11/22. Repeat every year  Bone Density status: Completed 10/06/22. Results reflect: Bone density results: NORMAL. Repeat every 5 years.  Lung Cancer Screening: (Low Dose CT Chest recommended if Age 61-80 years, 20 pack-year currently smoking OR have quit w/in 15years.) does not qualify.   Lung Cancer Screening Referral:    Additional Screening:  Hepatitis C Screening: does qualify; Completed 09/26/22  Vision Screening: Recommended annual ophthalmology exams for early detection of glaucoma and other disorders of the eye. Is the patient up to date with their annual eye exam?   Yes  Who is the provider or what is the name of the office in which the patient attends annual eye exams? [facility] If pt is not established with a provider, would they like to be referred to a provider to establish care? Yes .   Dental Screening: Recommended annual dental exams for proper oral hygiene  Diabetic Foot Exam:   Community Resource Referral / Chronic Care Management: CRR required this visit?  No   CCM required this visit?  No     Plan:     I have personally reviewed and noted the following in the patient's chart:   Medical and social history Use of alcohol, tobacco or illicit drugs  Current medications and supplements including opioid prescriptions. Patient is not currently taking opioid prescriptions. Functional ability and status Nutritional status Physical activity Advanced directives List of other physicians Hospitalizations, surgeries, and ER visits in previous 12 months Vitals Screenings to include cognitive, depression, and falls Referrals and appointments  In addition, I have reviewed and discussed with patient certain preventive protocols, quality metrics, and best practice recommendations. A written personalized care plan for preventive services as well as general preventive health recommendations were provided to patient.     Maryan Puls, LPN   02/03/1600   After Visit Summary:Declined. AVS available in MyChart   Nurse Notes: none

## 2023-04-16 NOTE — Patient Instructions (Addendum)
Ms. Kangas , Thank you for taking time to come for your Medicare Wellness Visit. I appreciate your ongoing commitment to your health goals. Please review the following plan we discussed and let me know if I can assist you in the future.   Referrals/Orders/Follow-Ups/Clinician Recommendations: Aim for 30 minutes of exercise or brisk walking, 6-8 glasses of water, and 5 servings of fruits and vegetables each day.   This is a list of the screening recommended for you and due dates:  Health Maintenance  Topic Date Due   Pap Smear  05/23/2022   Flu Shot  03/07/2023   COVID-19 Vaccine (7 - 2023-24 season) 04/07/2023   Medicare Annual Wellness Visit  05/18/2023   Mammogram  10/11/2023   DTaP/Tdap/Td vaccine (3 - Td or Tdap) 07/15/2028   Pneumonia Vaccine  Completed   DEXA scan (bone density measurement)  Completed   Hepatitis C Screening  Completed   Zoster (Shingles) Vaccine  Completed   HPV Vaccine  Aged Out   Colon Cancer Screening  Discontinued    Advanced directives: (In Chart) A copy of your advanced directives are scanned into your chart should your provider ever need it.  Next Medicare Annual Wellness Visit scheduled for next year: Yes

## 2023-04-17 ENCOUNTER — Other Ambulatory Visit (HOSPITAL_COMMUNITY): Payer: Self-pay

## 2023-04-17 ENCOUNTER — Other Ambulatory Visit: Payer: Self-pay | Admitting: Cardiology

## 2023-04-17 MED ORDER — METOPROLOL SUCCINATE ER 50 MG PO TB24
75.0000 mg | ORAL_TABLET | Freq: Two times a day (BID) | ORAL | 0 refills | Status: DC
  Filled 2023-04-17: qty 270, 90d supply, fill #0

## 2023-05-06 DIAGNOSIS — G4733 Obstructive sleep apnea (adult) (pediatric): Secondary | ICD-10-CM | POA: Diagnosis not present

## 2023-05-09 ENCOUNTER — Ambulatory Visit: Payer: PPO | Admitting: Family Medicine

## 2023-05-09 VITALS — BP 124/70 | HR 82 | Temp 98.6°F | Ht 67.0 in | Wt 161.2 lb

## 2023-05-09 DIAGNOSIS — R300 Dysuria: Secondary | ICD-10-CM | POA: Diagnosis not present

## 2023-05-09 LAB — POC URINALSYSI DIPSTICK (AUTOMATED)
Bilirubin, UA: NEGATIVE
Blood, UA: NEGATIVE
Glucose, UA: NEGATIVE
Ketones, UA: NEGATIVE
Leukocytes, UA: NEGATIVE
Nitrite, UA: NEGATIVE
Protein, UA: NEGATIVE
Spec Grav, UA: 1.01 (ref 1.010–1.025)
Urobilinogen, UA: 0.2 U/dL
pH, UA: 7.5 (ref 5.0–8.0)

## 2023-05-09 NOTE — Progress Notes (Signed)
Patient ID: Christina Griffith, female    DOB: [birth year], 72 y.o.   MRN: [id number]  This visit was conducted in person.  BP 124/70 (BP Location: Left Arm, Patient Position: Sitting, Cuff Size: Normal)   Pulse 82   Temp 98.6 F (37 C) (Oral)   Ht 5\' 7"  (1.702 m)   Wt 161 lb 3.2 oz (73.1 kg)   SpO2 92%   BMI 25.25 kg/m    CC:  Chief Complaint  Patient presents with   Dysuria    Some burning, urgency and frequent, foul urine for 6 weeks.     Subjective:   HPI: Christina Griffith is a 72 y.o. female presenting on 05/09/2023 for Dysuria (Some burning, urgency and frequent, foul urine for 6 weeks. )   Back surgery 02/27/23 foley placed in surgery, removed  2 week follow up had urgency, odor dysuria... treated with cipro x 3 days.  No testing.  Improved  Dysuria  The current episode started more than 1 month ago. The problem has been waxing and waning. The quality of the pain is described as aching. The pain is at a severity of 1/10. There has been no fever. She is Not sexually active. There is No history of pyelonephritis. Associated symptoms include urgency. Pertinent negatives include no chills, discharge, flank pain, hematuria, nausea or vomiting. She has tried increased fluids for the symptoms. There is no history of catheterization, kidney stones, recurrent UTIs or a single kidney.    No new meds, no new soaps  No douching.  History of endometrial cancer, constipation, hypertension     Relevant past medical, surgical, family and social history reviewed and updated as indicated. Interim medical history since our last visit reviewed. Allergies and medications reviewed and updated. Outpatient Medications Prior to Visit  Medication Sig Dispense Refill   albuterol (PROVENTIL HFA;VENTOLIN HFA) 108 (90 Base) MCG/ACT inhaler Inhale 2 puffs into the lungs every 6 (six) hours as needed for wheezing or shortness of breath. 1 Inhaler 2   atorvastatin (LIPITOR) 10 MG tablet Take 1  tablet (10 mg total) by mouth daily. (Patient taking differently: Take 10 mg by mouth every evening.) 90 tablet 1   Calcium Carb-Cholecalciferol 600-800 MG-UNIT TABS Take 1 tablet by mouth daily.     fexofenadine (ALLEGRA) 180 MG tablet Take 180 mg by mouth daily as needed for allergies or rhinitis.     losartan-hydrochlorothiazide (HYZAAR) 100-25 MG tablet Take 1 tablet by mouth daily. 90 tablet 0   magnesium gluconate (MAGONATE) 500 MG tablet Take 500 mg by mouth at bedtime.     methocarbamol (ROBAXIN) 500 MG tablet Take 1 tablet (500 mg total) by mouth every 6 (six) hours as needed for muscle spasms. 30 tablet 2   metoprolol succinate (TOPROL-XL) 50 MG 24 hr tablet Take 1&1/2 tablets (75 mg total) by mouth in the morning and at bedtime. Take with or immediately following a meal. 270 tablet 0   Multiple Vitamin (MULTIVITAMIN) tablet Take 1 tablet by mouth daily.     polyethylene glycol powder (GLYCOLAX/MIRALAX) 17 GM/SCOOP powder Take 17 g by mouth daily as needed (constipation).     Triamcinolone Acetonide (NASACORT ALLERGY 24HR NA) Place 1 spray into the nose daily as needed (allergies).     venlafaxine XR (EFFEXOR-XR) 75 MG 24 hr capsule Take 1 capsule (75 mg total) by mouth daily. (Patient taking differently: Take 75 mg by mouth at bedtime.) 90 capsule 0   ciprofloxacin (CIPRO) 500 MG tablet  Take 1 tablet (500 mg total) by mouth daily for UTI 3 tablet 0   oxyCODONE-acetaminophen (PERCOCET/ROXICET) 5-325 MG tablet Take 1-2 tablets by mouth every 4 (four) hours as needed for severe pain. 30 tablet 0   No facility-administered medications prior to visit.     Per HPI unless specifically indicated in ROS section below Review of Systems  Constitutional:  Negative for chills, fatigue and fever.  HENT:  Negative for congestion.   Eyes:  Negative for pain.  Respiratory:  Negative for cough and shortness of breath.   Cardiovascular:  Negative for chest pain, palpitations and leg swelling.   Gastrointestinal:  Negative for abdominal pain, nausea and vomiting.  Genitourinary:  Positive for dysuria and urgency. Negative for flank pain, hematuria and vaginal bleeding.  Musculoskeletal:  Negative for back pain.  Neurological:  Negative for syncope, light-headedness and headaches.  Psychiatric/Behavioral:  Negative for dysphoric mood.    Objective:  BP 124/70 (BP Location: Left Arm, Patient Position: Sitting, Cuff Size: Normal)   Pulse 82   Temp 98.6 F (37 C) (Oral)   Ht 5\' 7"  (1.702 m)   Wt 161 lb 3.2 oz (73.1 kg)   SpO2 92%   BMI 25.25 kg/m   Wt Readings from Last 3 Encounters:  05/09/23 161 lb 3.2 oz (73.1 kg)  04/16/23 166 lb 12.8 oz (75.7 kg)  03/08/23 163 lb (73.9 kg)      Physical Exam Constitutional:      General: She is not in acute distress.    Appearance: Normal appearance. She is well-developed. She is not ill-appearing or toxic-appearing.  HENT:     Head: Normocephalic.     Right Ear: Hearing, tympanic membrane, ear canal and external ear normal. Tympanic membrane is not erythematous, retracted or bulging.     Left Ear: Hearing, tympanic membrane, ear canal and external ear normal. Tympanic membrane is not erythematous, retracted or bulging.     Nose: No mucosal edema or rhinorrhea.     Right Sinus: No maxillary sinus tenderness or frontal sinus tenderness.     Left Sinus: No maxillary sinus tenderness or frontal sinus tenderness.     Mouth/Throat:     Mouth: Oropharynx is clear and moist and mucous membranes are normal.     Pharynx: Uvula midline.  Eyes:     General: Lids are normal. Lids are everted, no foreign bodies appreciated.     Extraocular Movements: EOM normal.     Conjunctiva/sclera: Conjunctivae normal.     Pupils: Pupils are equal, round, and reactive to light.  Neck:     Thyroid: No thyroid mass or thyromegaly.     Vascular: No carotid bruit.     Trachea: Trachea normal.  Cardiovascular:     Rate and Rhythm: Normal rate and regular  rhythm.     Pulses: Normal pulses.     Heart sounds: Normal heart sounds, S1 normal and S2 normal. No murmur heard.    No friction rub. No gallop.  Pulmonary:     Effort: Pulmonary effort is normal. No tachypnea or respiratory distress.     Breath sounds: Normal breath sounds. No decreased breath sounds, wheezing, rhonchi or rales.  Abdominal:     General: Bowel sounds are normal.     Palpations: Abdomen is soft.     Tenderness: There is abdominal tenderness in the right lower quadrant and left lower quadrant. There is no right CVA tenderness or left CVA tenderness.  Musculoskeletal:     Cervical  back: Normal range of motion and neck supple.  Skin:    General: Skin is warm, dry and intact.     Findings: No rash.  Neurological:     Mental Status: She is alert.  Psychiatric:        Mood and Affect: Mood is not anxious or depressed.        Speech: Speech normal.        Behavior: Behavior normal. Behavior is cooperative.        Thought Content: Thought content normal.        Cognition and Memory: Cognition and memory normal.        Judgment: Judgment normal.       Results for orders placed or performed in visit on 05/09/23  POCT Urinalysis Dipstick (Automated)  Result Value Ref Range   Color, UA yellow    Clarity, UA clear    Glucose, UA Negative Negative   Bilirubin, UA negative    Ketones, UA negative    Spec Grav, UA 1.010 1.010 - 1.025   Blood, UA negative    pH, UA 7.5 5.0 - 8.0   Protein, UA Negative Negative   Urobilinogen, UA 0.2 0.2 or 1.0 E.U./dL   Nitrite, UA negatve    Leukocytes, UA Negative Negative    Assessment and Plan  Dysuria -     POCT Urinalysis Dipstick (Automated)    No follow-ups on file.   Kerby Nora, MD

## 2023-05-09 NOTE — Assessment & Plan Note (Signed)
Acute, no evidence of urinary infection.  Symptoms possibly secondary to bladder irritation following Foley catheter associated UTI several months ago, versus secondary to mild constipation causing pressure on bladder. Recommend avoid bladder irritants, push water.  Consider using MiraLAX to keep stool moving daily.  Keep up with fiber and water in diet.  Return precautions provided

## 2023-05-15 ENCOUNTER — Other Ambulatory Visit: Payer: Self-pay | Admitting: Family Medicine

## 2023-05-16 ENCOUNTER — Other Ambulatory Visit (HOSPITAL_COMMUNITY): Payer: Self-pay

## 2023-05-16 MED ORDER — VENLAFAXINE HCL ER 75 MG PO CP24
75.0000 mg | ORAL_CAPSULE | Freq: Every day | ORAL | 0 refills | Status: DC
  Filled 2023-05-16: qty 90, 90d supply, fill #0

## 2023-05-21 ENCOUNTER — Telehealth: Payer: Self-pay | Admitting: *Deleted

## 2023-05-21 DIAGNOSIS — E7800 Pure hypercholesterolemia, unspecified: Secondary | ICD-10-CM

## 2023-05-21 NOTE — Telephone Encounter (Signed)
-----   Message from Alvina Chou sent at 05/21/2023  3:43 PM EDT ----- Regarding: Lab orders for Wed, 10.30.24 Patient is scheduled for CPX labs, please order future labs, Thanks , Camelia Eng

## 2023-05-22 ENCOUNTER — Other Ambulatory Visit (HOSPITAL_COMMUNITY): Payer: Self-pay

## 2023-05-24 DIAGNOSIS — M545 Low back pain, unspecified: Secondary | ICD-10-CM | POA: Diagnosis not present

## 2023-05-28 ENCOUNTER — Other Ambulatory Visit (HOSPITAL_COMMUNITY): Payer: Self-pay

## 2023-05-28 MED ORDER — AMOXICILLIN 500 MG PO CAPS
500.0000 mg | ORAL_CAPSULE | Freq: Three times a day (TID) | ORAL | 0 refills | Status: DC
  Filled 2023-05-28: qty 21, 7d supply, fill #0

## 2023-05-30 DIAGNOSIS — M4326 Fusion of spine, lumbar region: Secondary | ICD-10-CM | POA: Diagnosis not present

## 2023-06-03 ENCOUNTER — Other Ambulatory Visit (HOSPITAL_COMMUNITY): Payer: Self-pay

## 2023-06-03 ENCOUNTER — Other Ambulatory Visit: Payer: Self-pay | Admitting: Family Medicine

## 2023-06-03 DIAGNOSIS — M4326 Fusion of spine, lumbar region: Secondary | ICD-10-CM | POA: Diagnosis not present

## 2023-06-03 MED ORDER — ATORVASTATIN CALCIUM 10 MG PO TABS
10.0000 mg | ORAL_TABLET | Freq: Every day | ORAL | 1 refills | Status: DC
  Filled 2023-06-03: qty 90, 90d supply, fill #0
  Filled 2023-09-02: qty 90, 90d supply, fill #1

## 2023-06-05 ENCOUNTER — Other Ambulatory Visit (INDEPENDENT_AMBULATORY_CARE_PROVIDER_SITE_OTHER): Payer: PPO

## 2023-06-05 DIAGNOSIS — E7800 Pure hypercholesterolemia, unspecified: Secondary | ICD-10-CM

## 2023-06-05 DIAGNOSIS — M4326 Fusion of spine, lumbar region: Secondary | ICD-10-CM | POA: Diagnosis not present

## 2023-06-05 LAB — COMPREHENSIVE METABOLIC PANEL
ALT: 34 U/L (ref 0–35)
AST: 32 U/L (ref 0–37)
Albumin: 3.9 g/dL (ref 3.5–5.2)
Alkaline Phosphatase: 105 U/L (ref 39–117)
BUN: 20 mg/dL (ref 6–23)
CO2: 31 meq/L (ref 19–32)
Calcium: 9.9 mg/dL (ref 8.4–10.5)
Chloride: 102 meq/L (ref 96–112)
Creatinine, Ser: 0.81 mg/dL (ref 0.40–1.20)
GFR: 71.95 mL/min (ref >60.00)
Glucose, Bld: 92 mg/dL (ref 70–99)
Potassium: 4.3 meq/L (ref 3.5–5.1)
Sodium: 141 meq/L (ref 135–145)
Total Bilirubin: 0.4 mg/dL (ref 0.2–1.2)
Total Protein: 6.6 g/dL (ref 6.0–8.3)

## 2023-06-05 LAB — LIPID PANEL
Cholesterol: 161 mg/dL (ref 0–200)
HDL: 56.8 mg/dL (ref >39.00)
LDL Cholesterol: 75 mg/dL (ref 0–99)
NonHDL: 103.91
Total CHOL/HDL Ratio: 3
Triglycerides: 146 mg/dL (ref 0.0–149.0)
VLDL: 29.2 mg/dL (ref 0.0–40.0)

## 2023-06-05 NOTE — Progress Notes (Signed)
No critical labs need to be addressed urgently. We will discuss labs in detail at upcoming office visit.   

## 2023-06-06 DIAGNOSIS — G4733 Obstructive sleep apnea (adult) (pediatric): Secondary | ICD-10-CM | POA: Diagnosis not present

## 2023-06-10 DIAGNOSIS — M4326 Fusion of spine, lumbar region: Secondary | ICD-10-CM | POA: Diagnosis not present

## 2023-06-12 ENCOUNTER — Ambulatory Visit (INDEPENDENT_AMBULATORY_CARE_PROVIDER_SITE_OTHER): Payer: PPO | Admitting: Family Medicine

## 2023-06-12 ENCOUNTER — Encounter: Payer: Self-pay | Admitting: Family Medicine

## 2023-06-12 VITALS — BP 116/64 | HR 89 | Temp 98.6°F | Ht 67.0 in | Wt 167.0 lb

## 2023-06-12 DIAGNOSIS — R7989 Other specified abnormal findings of blood chemistry: Secondary | ICD-10-CM

## 2023-06-12 DIAGNOSIS — I10 Essential (primary) hypertension: Secondary | ICD-10-CM

## 2023-06-12 DIAGNOSIS — E7800 Pure hypercholesterolemia, unspecified: Secondary | ICD-10-CM

## 2023-06-12 DIAGNOSIS — J4520 Mild intermittent asthma, uncomplicated: Secondary | ICD-10-CM | POA: Diagnosis not present

## 2023-06-12 DIAGNOSIS — G4733 Obstructive sleep apnea (adult) (pediatric): Secondary | ICD-10-CM | POA: Diagnosis not present

## 2023-06-12 DIAGNOSIS — F411 Generalized anxiety disorder: Secondary | ICD-10-CM | POA: Diagnosis not present

## 2023-06-12 DIAGNOSIS — Z0000 Encounter for general adult medical examination without abnormal findings: Secondary | ICD-10-CM

## 2023-06-12 DIAGNOSIS — I4719 Other supraventricular tachycardia: Secondary | ICD-10-CM | POA: Diagnosis not present

## 2023-06-12 NOTE — Assessment & Plan Note (Signed)
Stable, chronic.  Continue current medication.  Venlafaxine xl 75 mg daily.

## 2023-06-12 NOTE — Assessment & Plan Note (Signed)
Stable, chronic.  Continue current medication. Well controlled on losartan hydrochlorothiazide 100/25 mg p.o. daily, metoprolol 50 mg 1 tablets p.o. a.m. and  1.5 in p.m.

## 2023-06-12 NOTE — Progress Notes (Signed)
Patient ID: Christina Griffith, female    DOB: [birth year], 72 y.o.   MRN: [id number]  This visit was conducted in person.  BP 116/64 (BP Location: Left Arm, Patient Position: Sitting, Cuff Size: Normal)   Pulse 89   Temp 98.6 F (37 C) (Oral)   Ht 5\' 7"  (1.702 m)   Wt 167 lb (75.8 kg)   SpO2 96%   BMI 26.16 kg/m    CC:  Chief Complaint  Patient presents with   Medicare Wellness    Part 2     HPI: Christina Griffith is a 72 y.o. female presenting on 06/12/2023 for Medicare Wellness (Part 2)  The patient presents for complete physical and review of chronic health problems. He/She also has the following acute concerns today: none   Elevated Cholesterol: LDL at goal on atorvastatin 10 mg p.o. daily Lab Results  Component Value Date   CHOL 161 06/05/2023   HDL 56.80 06/05/2023   LDLCALC 75 06/05/2023   LDLDIRECT 104.0 05/18/2022   TRIG 146.0 06/05/2023   CHOLHDL 3 06/05/2023  Using medications without problems:none Muscle aches:  none Diet compliance:  good. Exercise:  Doing PT for back, walking   daily 15 min 6 times a day with dog Other complaints:  Hypertension:  Well controlled on losartan hydrochlorothiazide 100/25 mg p.o. daily, metoprolol 50 mg 1 tablets p.o. a.m. and  1.5 in p.m. BP Readings from Last 3 Encounters:  06/12/23 116/64  05/09/23 124/70  04/16/23 104/62  Using medication without problems or lightheadedness:  occ with standing quickly Chest pain with exertion: none Edema: none Short of breath: none Average home BPs: Other issues:  Mild intermittent asthma: Chronic, stable  PVCs, PAT, PSVT: Followed by cardiology... metoprolol changed  to lower AM dose given fatigue. Reviewed last OV  Dr. Mayford Knife   New Dx of OSA in last year... energy is better.  GAD chronic, stable on venlafaxine 75 mg p.o. daily  Mother passed this past year with dementia,    06/12/2023    9:46 AM 05/09/2023   10:14 AM  GAD 7 : Generalized Anxiety Score  Nervous,  Anxious, on Edge 0 0  Control/stop worrying 0 0  Worry too much - different things 0 0  Trouble relaxing 0 0  Restless 0 0  Easily annoyed or irritable 0 0  Afraid - awful might happen 0 0  Total GAD 7 Score 0 0  Anxiety Difficulty Not difficult at all Not difficult at all   Elevated LFTs: Now back in the normal range with decreased Tylenol and alcohol use, decrease fat in diet.     Relevant past medical, surgical, family and social history reviewed and updated as indicated. Interim medical history since our last visit reviewed. Allergies and medications reviewed and updated. Outpatient Medications Prior to Visit  Medication Sig Dispense Refill   albuterol (PROVENTIL HFA;VENTOLIN HFA) 108 (90 Base) MCG/ACT inhaler Inhale 2 puffs into the lungs every 6 (six) hours as needed for wheezing or shortness of breath. 1 Inhaler 2   atorvastatin (LIPITOR) 10 MG tablet Take 1 tablet (10 mg total) by mouth daily. 90 tablet 1   Calcium Carb-Cholecalciferol 600-800 MG-UNIT TABS Take 1 tablet by mouth daily.     fexofenadine (ALLEGRA) 180 MG tablet Take 180 mg by mouth daily as needed for allergies or rhinitis.     losartan-hydrochlorothiazide (HYZAAR) 100-25 MG tablet Take 1 tablet by mouth daily. 90 tablet 0   magnesium gluconate (MAGONATE) 500 MG  tablet Take 500 mg by mouth at bedtime.     methocarbamol (ROBAXIN) 500 MG tablet Take 1 tablet (500 mg total) by mouth every 6 (six) hours as needed for muscle spasms. 30 tablet 2   metoprolol succinate (TOPROL-XL) 50 MG 24 hr tablet Take 1&1/2 tablets (75 mg total) by mouth in the morning and at bedtime. Take with or immediately following a meal. 270 tablet 0   Multiple Vitamin (MULTIVITAMIN) tablet Take 1 tablet by mouth daily.     polyethylene glycol powder (GLYCOLAX/MIRALAX) 17 GM/SCOOP powder Take 17 g by mouth daily as needed (constipation).     Triamcinolone Acetonide (NASACORT ALLERGY 24HR NA) Place 1 spray into the nose daily as needed  (allergies).     venlafaxine XR (EFFEXOR-XR) 75 MG 24 hr capsule Take 1 capsule (75 mg total) by mouth daily. 90 capsule 0   amoxicillin (AMOXIL) 500 MG capsule Take 1 capsule (500 mg total) by mouth 3 (three) times daily for 7 days until gone 21 capsule 0   ciprofloxacin (CIPRO) 500 MG tablet Take 1 tablet (500 mg total) by mouth daily for UTI 3 tablet 0   oxyCODONE-acetaminophen (PERCOCET/ROXICET) 5-325 MG tablet Take 1-2 tablets by mouth every 4 (four) hours as needed for severe pain. 30 tablet 0   No facility-administered medications prior to visit.     Per HPI unless specifically indicated in ROS section below Review of Systems  Constitutional:  Negative for fatigue and fever.  HENT:  Negative for congestion.   Eyes:  Negative for pain.  Respiratory:  Negative for cough and shortness of breath.   Cardiovascular:  Negative for chest pain, palpitations and leg swelling.  Gastrointestinal:  Negative for abdominal pain.  Genitourinary:  Negative for dysuria and vaginal bleeding.  Musculoskeletal:  Negative for back pain.  Neurological:  Negative for syncope, light-headedness and headaches.  Psychiatric/Behavioral:  Negative for dysphoric mood.    Objective:  BP 116/64 (BP Location: Left Arm, Patient Position: Sitting, Cuff Size: Normal)   Pulse 89   Temp 98.6 F (37 C) (Oral)   Ht 5\' 7"  (1.702 m)   Wt 167 lb (75.8 kg)   SpO2 96%   BMI 26.16 kg/m   Wt Readings from Last 3 Encounters:  06/12/23 167 lb (75.8 kg)  05/09/23 161 lb 3.2 oz (73.1 kg)  04/16/23 166 lb 12.8 oz (75.7 kg)      Physical Exam Vitals and nursing note reviewed.  Constitutional:      General: She is not in acute distress.    Appearance: Normal appearance. She is well-developed. She is not ill-appearing or toxic-appearing.  HENT:     Head: Normocephalic.     Right Ear: Hearing, tympanic membrane, ear canal and external ear normal.     Left Ear: Hearing, tympanic membrane, ear canal and external ear  normal.     Nose: Nose normal.  Eyes:     General: Lids are normal. Lids are everted, no foreign bodies appreciated.     Conjunctiva/sclera: Conjunctivae normal.     Pupils: Pupils are equal, round, and reactive to light.  Neck:     Thyroid: No thyroid mass or thyromegaly.     Vascular: No carotid bruit.     Trachea: Trachea normal.  Cardiovascular:     Rate and Rhythm: Normal rate and regular rhythm.     Heart sounds: Normal heart sounds, S1 normal and S2 normal. No murmur heard.    No gallop.  Pulmonary:  Effort: Pulmonary effort is normal. No respiratory distress.     Breath sounds: Normal breath sounds. No wheezing, rhonchi or rales.  Abdominal:     General: Bowel sounds are normal. There is no distension or abdominal bruit.     Palpations: Abdomen is soft. There is no fluid wave or mass.     Tenderness: There is abdominal tenderness in the left lower quadrant. There is no right CVA tenderness, left CVA tenderness, guarding or rebound.     Hernia: No hernia is present.  Musculoskeletal:     Cervical back: Normal range of motion and neck supple.  Lymphadenopathy:     Cervical: No cervical adenopathy.  Skin:    General: Skin is warm and dry.     Findings: No rash.  Neurological:     Mental Status: She is alert.     Cranial Nerves: No cranial nerve deficit.     Sensory: No sensory deficit.  Psychiatric:        Mood and Affect: Mood is not anxious or depressed.        Speech: Speech normal.        Behavior: Behavior normal. Behavior is cooperative.        Judgment: Judgment normal.       Results for orders placed or performed in visit on 06/05/23  Lipid panel  Result Value Ref Range   Cholesterol 161 0 - 200 mg/dL   Triglycerides 161.0 0.0 - 149.0 mg/dL   HDL 96.04 >54.09 mg/dL   VLDL 81.1 0.0 - 91.4 mg/dL   LDL Cholesterol 75 0 - 99 mg/dL   Total CHOL/HDL Ratio 3    NonHDL 103.91   Comprehensive metabolic panel  Result Value Ref Range   Sodium 141 135 - 145  mEq/L   Potassium 4.3 3.5 - 5.1 mEq/L   Chloride 102 96 - 112 mEq/L   CO2 31 19 - 32 mEq/L   Glucose, Bld 92 70 - 99 mg/dL   BUN 20 6 - 23 mg/dL   Creatinine, Ser 7.82 0.40 - 1.20 mg/dL   Total Bilirubin 0.4 0.2 - 1.2 mg/dL   Alkaline Phosphatase 105 39 - 117 U/L   AST 32 0 - 37 U/L   ALT 34 0 - 35 U/L   Total Protein 6.6 6.0 - 8.3 g/dL   Albumin 3.9 3.5 - 5.2 g/dL   GFR 95.62 >13.08 mL/min   Calcium 9.9 8.4 - 10.5 mg/dL     COVID 19 screen:  No recent travel or known exposure to COVID19 The patient denies respiratory symptoms of COVID 19 at this time. The importance of social distancing was discussed today.   Assessment and Plan The patient's preventative maintenance and recommended screening tests for an annual wellness exam were reviewed in full today. Brought up to date unless services declined.  Counselled on the importance of diet, exercise, and its role in overall health and mortality. The patient's FH and SH was reviewed, including their home life, tobacco status, and drug and alcohol status.    Vaccines:uptodate with PNA,  shingrix, tdap  and flu. Nonsmoker   S/P COVID series x 7 Mammo: nml in 10/2022, repeat q 1 years given endometrial cancer DVE/PAP:  Pap 05/2021 nml, hx of endometrial cancer, released by GYN, no further paps needed. Colon: Colonoscopy  07/2015, hx of polyps, repeat in 5 years.  Dr. Loreta Ave Dec 2023, no further indicated. 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 SE of constipation. Stopped  Fosamax after surgery.  Repeat  DXA 12/2015, 10/2021 NORMAL on no med.   HCV neg in 2014.    Problem List Items Addressed This Visit     Elevated LFTs    Now resolved.      GAD (generalized anxiety disorder)    Stable, chronic.  Continue current medication.  Venlafaxine xl 75 mg daily.      HYPERCHOLESTEROLEMIA    Chronic, well-controlled  LDL at goal on atorvastatin 10 mg p.o. daily      Hypertension    Stable, chronic.  Continue current  medication. Well controlled on losartan hydrochlorothiazide 100/25 mg p.o. daily, metoprolol 50 mg 1 tablets p.o. a.m. and  1.5 in p.m.      Mild intermittent asthma    Chronic, continued mild occasional flares.  Using albuterol as needed.      PAT (paroxysmal atrial tachycardia) (HCC)    Followed by Cardiology.      Other Visit Diagnoses     Routine general medical examination at a health care facility    -  Primary         Kerby Nora, MD

## 2023-06-12 NOTE — Assessment & Plan Note (Signed)
Chronic, well-controlled  LDL at goal on atorvastatin 10 mg p.o. daily

## 2023-06-12 NOTE — Assessment & Plan Note (Signed)
Now resolved.  

## 2023-06-12 NOTE — Assessment & Plan Note (Signed)
Chronic well controlled with CPAP.  Energy better overall.

## 2023-06-12 NOTE — Assessment & Plan Note (Signed)
Followed by Cardiology 

## 2023-06-12 NOTE — Assessment & Plan Note (Signed)
Chronic, continued mild occasional flares.  Using albuterol as needed.

## 2023-06-13 DIAGNOSIS — M4326 Fusion of spine, lumbar region: Secondary | ICD-10-CM | POA: Diagnosis not present

## 2023-06-17 DIAGNOSIS — M4326 Fusion of spine, lumbar region: Secondary | ICD-10-CM | POA: Diagnosis not present

## 2023-06-18 ENCOUNTER — Other Ambulatory Visit: Payer: Self-pay | Admitting: Medical Genetics

## 2023-06-18 DIAGNOSIS — Z006 Encounter for examination for normal comparison and control in clinical research program: Secondary | ICD-10-CM

## 2023-06-19 ENCOUNTER — Other Ambulatory Visit
Admission: RE | Admit: 2023-06-19 | Discharge: 2023-06-19 | Disposition: A | Discharge status: Home / Self Care | Payer: PPO | Source: Ambulatory Visit | Attending: Medical Genetics | Admitting: Medical Genetics

## 2023-06-19 DIAGNOSIS — Z006 Encounter for examination for normal comparison and control in clinical research program: Secondary | ICD-10-CM | POA: Insufficient documentation

## 2023-06-20 DIAGNOSIS — M4326 Fusion of spine, lumbar region: Secondary | ICD-10-CM | POA: Diagnosis not present

## 2023-06-24 DIAGNOSIS — M4326 Fusion of spine, lumbar region: Secondary | ICD-10-CM | POA: Diagnosis not present

## 2023-06-29 LAB — HELIX MOLECULAR SCREEN: Genetic Analysis Overall Interpretation: NEGATIVE

## 2023-06-29 LAB — GENECONNECT MOLECULAR SCREEN

## 2023-07-03 DIAGNOSIS — G4733 Obstructive sleep apnea (adult) (pediatric): Secondary | ICD-10-CM | POA: Diagnosis not present

## 2023-07-06 DIAGNOSIS — G4733 Obstructive sleep apnea (adult) (pediatric): Secondary | ICD-10-CM | POA: Diagnosis not present

## 2023-07-09 ENCOUNTER — Other Ambulatory Visit (HOSPITAL_COMMUNITY): Payer: Self-pay

## 2023-07-09 ENCOUNTER — Other Ambulatory Visit: Payer: Self-pay | Admitting: Family Medicine

## 2023-07-09 MED ORDER — LOSARTAN POTASSIUM-HCTZ 100-25 MG PO TABS
1.0000 | ORAL_TABLET | Freq: Every day | ORAL | 3 refills | Status: DC
  Filled 2023-07-09: qty 90, 90d supply, fill #0
  Filled 2023-10-16: qty 90, 90d supply, fill #1
  Filled 2024-01-17: qty 90, 90d supply, fill #2
  Filled 2024-04-20: qty 90, 90d supply, fill #3

## 2023-07-22 ENCOUNTER — Other Ambulatory Visit: Payer: Self-pay | Admitting: Cardiology

## 2023-07-23 ENCOUNTER — Other Ambulatory Visit (HOSPITAL_COMMUNITY): Payer: Self-pay

## 2023-07-23 MED ORDER — METOPROLOL SUCCINATE ER 50 MG PO TB24
75.0000 mg | ORAL_TABLET | Freq: Two times a day (BID) | ORAL | 3 refills | Status: DC
  Filled 2023-07-23: qty 270, 90d supply, fill #0
  Filled 2023-11-12: qty 270, 90d supply, fill #1
  Filled 2024-02-10: qty 270, 90d supply, fill #2
  Filled 2024-05-11: qty 270, 90d supply, fill #3

## 2023-08-06 DIAGNOSIS — G4733 Obstructive sleep apnea (adult) (pediatric): Secondary | ICD-10-CM | POA: Diagnosis not present

## 2023-08-12 ENCOUNTER — Other Ambulatory Visit (HOSPITAL_COMMUNITY): Payer: Self-pay

## 2023-08-12 ENCOUNTER — Other Ambulatory Visit: Payer: Self-pay | Admitting: Family Medicine

## 2023-08-12 MED ORDER — VENLAFAXINE HCL ER 75 MG PO CP24
75.0000 mg | ORAL_CAPSULE | Freq: Every day | ORAL | 1 refills | Status: DC
Start: 2023-08-12 — End: 2024-02-10
  Filled 2023-08-12: qty 35, 35d supply, fill #0
  Filled 2023-08-12: qty 55, 55d supply, fill #0
  Filled 2023-11-12: qty 90, 90d supply, fill #1

## 2023-09-06 DIAGNOSIS — G4733 Obstructive sleep apnea (adult) (pediatric): Secondary | ICD-10-CM | POA: Diagnosis not present

## 2023-10-04 DIAGNOSIS — G4733 Obstructive sleep apnea (adult) (pediatric): Secondary | ICD-10-CM | POA: Diagnosis not present

## 2023-10-17 ENCOUNTER — Other Ambulatory Visit (HOSPITAL_COMMUNITY): Payer: Self-pay

## 2023-10-17 DIAGNOSIS — Z1231 Encounter for screening mammogram for malignant neoplasm of breast: Secondary | ICD-10-CM | POA: Diagnosis not present

## 2023-10-17 LAB — HM MAMMOGRAPHY

## 2023-10-18 ENCOUNTER — Encounter: Payer: Self-pay | Admitting: Family Medicine

## 2023-10-30 ENCOUNTER — Other Ambulatory Visit (HOSPITAL_COMMUNITY): Payer: Self-pay

## 2023-10-30 MED ORDER — AMOXICILLIN 500 MG PO CAPS
500.0000 mg | ORAL_CAPSULE | Freq: Three times a day (TID) | ORAL | 0 refills | Status: AC
  Filled 2023-10-30: qty 21, 7d supply, fill #0

## 2023-11-04 DIAGNOSIS — G4733 Obstructive sleep apnea (adult) (pediatric): Secondary | ICD-10-CM | POA: Diagnosis not present

## 2023-11-27 ENCOUNTER — Other Ambulatory Visit: Payer: Self-pay

## 2023-11-27 ENCOUNTER — Other Ambulatory Visit: Payer: Self-pay | Admitting: Family Medicine

## 2023-11-27 ENCOUNTER — Other Ambulatory Visit (HOSPITAL_COMMUNITY): Payer: Self-pay

## 2023-11-27 MED ORDER — ATORVASTATIN CALCIUM 10 MG PO TABS
10.0000 mg | ORAL_TABLET | Freq: Every day | ORAL | 1 refills | Status: DC
  Filled 2023-11-27: qty 90, 90d supply, fill #0
  Filled 2024-03-02: qty 90, 90d supply, fill #1

## 2023-12-02 DIAGNOSIS — Z9842 Cataract extraction status, left eye: Secondary | ICD-10-CM | POA: Diagnosis not present

## 2023-12-02 DIAGNOSIS — H52223 Regular astigmatism, bilateral: Secondary | ICD-10-CM | POA: Diagnosis not present

## 2023-12-02 DIAGNOSIS — Z9841 Cataract extraction status, right eye: Secondary | ICD-10-CM | POA: Diagnosis not present

## 2023-12-02 DIAGNOSIS — H17812 Minor opacity of cornea, left eye: Secondary | ICD-10-CM | POA: Diagnosis not present

## 2023-12-04 DIAGNOSIS — G4733 Obstructive sleep apnea (adult) (pediatric): Secondary | ICD-10-CM | POA: Diagnosis not present

## 2024-01-03 DIAGNOSIS — G4733 Obstructive sleep apnea (adult) (pediatric): Secondary | ICD-10-CM | POA: Diagnosis not present

## 2024-01-04 DIAGNOSIS — G4733 Obstructive sleep apnea (adult) (pediatric): Secondary | ICD-10-CM | POA: Diagnosis not present

## 2024-02-10 ENCOUNTER — Other Ambulatory Visit (HOSPITAL_COMMUNITY): Payer: Self-pay

## 2024-02-10 ENCOUNTER — Other Ambulatory Visit: Payer: Self-pay | Admitting: Family Medicine

## 2024-02-10 MED ORDER — VENLAFAXINE HCL ER 75 MG PO CP24
75.0000 mg | ORAL_CAPSULE | Freq: Every day | ORAL | 0 refills | Status: DC
  Filled 2024-02-10: qty 90, 90d supply, fill #0

## 2024-03-02 ENCOUNTER — Other Ambulatory Visit (HOSPITAL_COMMUNITY): Payer: Self-pay

## 2024-03-24 DIAGNOSIS — G4733 Obstructive sleep apnea (adult) (pediatric): Secondary | ICD-10-CM | POA: Diagnosis not present

## 2024-05-11 ENCOUNTER — Other Ambulatory Visit: Payer: Self-pay

## 2024-05-11 ENCOUNTER — Other Ambulatory Visit (HOSPITAL_COMMUNITY): Payer: Self-pay

## 2024-05-11 ENCOUNTER — Other Ambulatory Visit: Payer: Self-pay | Admitting: Family Medicine

## 2024-05-11 MED ORDER — VENLAFAXINE HCL ER 75 MG PO CP24
75.0000 mg | ORAL_CAPSULE | Freq: Every day | ORAL | 0 refills | Status: DC
  Filled 2024-05-11: qty 90, 90d supply, fill #0

## 2024-05-27 ENCOUNTER — Other Ambulatory Visit: Payer: Self-pay | Admitting: Family Medicine

## 2024-05-27 ENCOUNTER — Other Ambulatory Visit (HOSPITAL_COMMUNITY): Payer: Self-pay

## 2024-05-27 MED ORDER — ATORVASTATIN CALCIUM 10 MG PO TABS
10.0000 mg | ORAL_TABLET | Freq: Every day | ORAL | 0 refills | Status: DC
  Filled 2024-05-27: qty 90, 90d supply, fill #0

## 2024-06-01 ENCOUNTER — Telehealth: Payer: Self-pay | Admitting: *Deleted

## 2024-06-01 DIAGNOSIS — E7800 Pure hypercholesterolemia, unspecified: Secondary | ICD-10-CM

## 2024-06-01 NOTE — Telephone Encounter (Signed)
-----   Message from Veva JINNY Ferrari sent at 06/01/2024  2:57 PM EDT ----- Regarding: Lab orders forTue, 11.4.25 Patient is scheduled for CPX labs, please order future labs, Thanks , Veva

## 2024-06-09 ENCOUNTER — Other Ambulatory Visit (INDEPENDENT_AMBULATORY_CARE_PROVIDER_SITE_OTHER): Payer: PPO

## 2024-06-09 ENCOUNTER — Ambulatory Visit: Payer: Self-pay | Admitting: Family Medicine

## 2024-06-09 DIAGNOSIS — E7800 Pure hypercholesterolemia, unspecified: Secondary | ICD-10-CM | POA: Diagnosis not present

## 2024-06-09 LAB — COMPREHENSIVE METABOLIC PANEL WITH GFR
ALT: 42 U/L — ABNORMAL HIGH (ref 0–35)
AST: 31 U/L (ref 0–37)
Albumin: 4.2 g/dL (ref 3.5–5.2)
Alkaline Phosphatase: 81 U/L (ref 39–117)
BUN: 19 mg/dL (ref 6–23)
CO2: 32 meq/L (ref 19–32)
Calcium: 10 mg/dL (ref 8.4–10.5)
Chloride: 101 meq/L (ref 96–112)
Creatinine, Ser: 0.77 mg/dL (ref 0.40–1.20)
GFR: 75.92 mL/min (ref >60.00)
Glucose, Bld: 87 mg/dL (ref 70–99)
Potassium: 4.2 meq/L (ref 3.5–5.1)
Sodium: 139 meq/L (ref 135–145)
Total Bilirubin: 0.4 mg/dL (ref 0.2–1.2)
Total Protein: 7.2 g/dL (ref 6.0–8.3)

## 2024-06-09 LAB — LIPID PANEL
Cholesterol: 182 mg/dL (ref 0–200)
HDL: 48.9 mg/dL (ref >39.00)
LDL Cholesterol: 82 mg/dL (ref 0–99)
NonHDL: 132.87
Total CHOL/HDL Ratio: 4
Triglycerides: 252 mg/dL — ABNORMAL HIGH (ref 0.0–149.0)
VLDL: 50.4 mg/dL — ABNORMAL HIGH (ref 0.0–40.0)

## 2024-06-09 NOTE — Progress Notes (Signed)
 No critical labs need to be addressed urgently. We will discuss labs in detail at upcoming office visit.

## 2024-06-12 ENCOUNTER — Ambulatory Visit: Payer: PPO

## 2024-06-12 VITALS — BP 110/60 | Ht 67.0 in | Wt 172.2 lb

## 2024-06-12 DIAGNOSIS — Z0000 Encounter for general adult medical examination without abnormal findings: Secondary | ICD-10-CM | POA: Diagnosis not present

## 2024-06-12 NOTE — Progress Notes (Signed)
 Subjective:   Christina Griffith is a 72 y.o. female who presents for a Medicare Annual Wellness Visit.  Allergies (verified) Patient has no known allergies.   History: Past Medical History:  Diagnosis Date   Allergy    seasonal nasal allergies   Arthritis    spinal stenosis--back pain--hx of herniated cervical disk- but no surgery   Asthma    Cataract 2016   surgical repair bilateral. Laser treatment for scar tissue March 2023   Cold 10/03/2012   pt getting over a cold--still has some head congestion, slight cough   Colon polyps    Depression 1992   received 18 months of SSRI and therapy   Endometrial ca [facility])    H/O: hematuria    benign essential hematuria - per urology work up    High cholesterol    History of back surgery 02/27/2023   History of depression    History of pneumonia    Hypertension    Numbness in right leg    pt. states occassional in right leg and right big toe from disc herniations   PAT (paroxysmal atrial tachycardia)    noted on heart monitor 07/2020   PONV (postoperative nausea and vomiting)    with prior hip replacement   PVC's (premature ventricular contractions)    PVCs, trigeminal PVCs, WCT nonsustained with PVC load 18% by montior 07/2020   Sleep apnea diagnosed in Feb 2024   home sleep study showed mild OSA. On CPAP   Stress incontinence    Wears glasses    Past Surgical History:  Procedure Laterality Date   ABDOMINAL HYSTERECTOMY  10/07/12   treatment for endometrial cancer   BREAST SURGERY Right 1980's   breast biopsy   COLONOSCOPY     DENTAL SURGERY  11/2019   EYE SURGERY Bilateral 02/2015   cataracts   HYSTEROSCOPY WITH D & C Left 1/24/204   w/ resection of Endometrial polyps   JOINT REPLACEMENT  right: 12/17/00, left: 02/09/03   revision on right: 05/09/15   ROBOTIC ASSISTED TOTAL HYSTERECTOMY WITH BILATERAL SALPINGO OOPHERECTOMY N/A 10/07/2012   Procedure: ROBOTIC ASSISTED TOTAL HYSTERECTOMY WITH BILATERAL SALPINGO  OOPHORECTOMY/POSSIBLE LYMPH NODE BIOPSY;  Surgeon: Elenore A. Dodie, MD;  Location: [facility];  Service: Gynecology;  Laterality: N/A;   SPINE SURGERY  date of surgery- 02/27/2023   decompression and fusion of 3 herniated discs in lumbar spine   TOTAL HIP ARTHROPLASTY     right 2002 left 2004   TOTAL HIP ARTHROPLASTY Left 2004   TOTAL HIP REVISION Right 05/09/2015   Procedure: TOTAL HIP REVISION;  Surgeon: Dempsey Sensor, MD;  Location: [facility] OR;  Service: Orthopedics;  Laterality: Right;   TRANSFORAMINAL LUMBAR INTERBODY FUSION (TLIF) WITH PEDICLE SCREW FIXATION 4 LEVEL Left 02/27/2023   Procedure: LEFT-SIDED LUMBAR 2- LUMBAR 3, LUMBAR 3- LUMBAR 4, LUMBAR 4- LUMBAR 5 TRANSFORAMINAL LUMBAR INTERBODY FUSION AND DECOMPRESSION WITH INSTRUMENTATION AND ALLOGRAFT;  Surgeon: Beuford Anes, MD;  Location: [facility] OR;  Service: Orthopedics;  Laterality: Left;   Family History  Problem Relation Age of Onset   Hypertension Mother    Arthritis Mother    Hearing loss Mother    Cancer Father        bladder and prostate   Hyperlipidemia Father    Heart disease Father    Cancer Maternal Grandmother        colon   Arthritis Maternal Grandmother    Breast cancer Cousin    Stroke Maternal Grandfather  Depression Paternal Grandfather    Anxiety disorder Paternal Grandmother    Hearing loss Paternal Grandmother    Vision loss Paternal Grandmother    Vision loss Paternal Aunt    Arthritis Maternal Aunt    Social History   Occupational History   Occupation: Oncologist: [employer]  Tobacco Use   Smoking status: Never   Smokeless tobacco: Never  Vaping Use   Vaping status: Never Used  Substance and Sexual Activity   Alcohol use: Yes    Alcohol/week: 3.0 standard drinks of alcohol    Types: 3 Glasses of wine per week    Comment: 3 x a week   Drug use: No   Sexual activity: Not on file   Tobacco Counseling Counseling given: Not Answered  SDOH Screenings   Food Insecurity:  No Food Insecurity (06/12/2024)  Housing: Unknown (06/12/2024)  Transportation Needs: No Transportation Needs (06/12/2024)  Utilities: Not At Risk (06/12/2024)  Alcohol Screen: Low Risk  (06/10/2023)  Depression (PHQ2-9): Low Risk  (06/12/2024)  Financial Resource Strain: Low Risk  (06/10/2023)  Physical Activity: Sufficiently Active (06/12/2024)  Social Connections: Moderately Integrated (06/12/2024)  Stress: No Stress Concern Present (06/12/2024)  Tobacco Use: Low Risk  (06/12/2024)  Health Literacy: Adequate Health Literacy (06/12/2024)   Depression Screen    06/12/2024    9:09 AM 06/12/2023    9:46 AM 05/09/2023   10:14 AM 04/16/2023    1:47 PM 05/17/2022   10:14 AM 04/29/2021    9:15 AM 04/26/2020    8:28 AM  PHQ 2/9 Scores  PHQ - 2 Score 0 0 0 0 0 0 0  PHQ- 9 Score  0  0   1   0      Data saved with a previous flowsheet row definition     Goals Addressed             This Visit's Progress    COMPLETED: Increase physical activity       Starting 04/18/2018, I will continue to walk at least 60 minutes daily.      COMPLETED: Patient Stated       04/24/2019, Patient wants to improve her diet and eat better.     COMPLETED: Patient Stated       04/26/2020, I will continue to do yoga 3 days a week and exercise bike 2 days a week for about 30 minutes.      COMPLETED: Patient Stated       Rebuild strength     Weight (lb) < 200 lb (90.7 kg)   172 lb 3.2 oz (78.1 kg)    I would like to lose some weight and improve my cholersterol       Visit info / Clinical Intake: Medicare Wellness Visit Type:: Subsequent Annual Wellness Visit Medicare Wellness Visit Mode:: In-person (required for WTM) Interpreter Needed?: No Pre-visit prep was completed: yes AWV questionnaire completed by patient prior to visit?: no Living arrangements:: lives with spouse/significant other Patient's Overall Health Status Rating: very good Typical amount of pain: some Does pain affect daily life?: no Are you  currently prescribed opioids?: no  Dietary Habits and Nutritional Risks How many meals a day?: 3 Eats fruit and vegetables daily?: yes Most meals are obtained by: preparing own meals In the last 2 weeks, have you had any of the following?: -- (none) Diabetic:: no  Functional Status Activities of Daily Living (to include ambulation/medication): (Patient-Rptd) Independent Ambulation: (Patient-Rptd) Independent Medication Administration:  Independent Home Management: (Patient-Rptd) Independent Manage your own finances?: yes Primary transportation is: driving Concerns about vision?: (!) yes (floaters) Concerns about hearing?: (!) yes (hard to hear in room filled w/ppl; or conversations not clear) Uses hearing aids?: no Hear whispered voice?: (!) no *in-person visit only*  Fall Screening Falls in the past year?: (Patient-Rptd) 0  Fall Risk Fall risk Follow up: Falls prevention discussed; Education provided  Home and Transportation Safety: All rugs have non-skid backing?: yes All stairs or steps have railings?: (!) no (3 steps outside without railing but side of house to lean) Grab bars in the bathtub or shower?: yes Have non-skid surface in bathtub or shower?: yes Good home lighting?: yes Regular seat belt use?: yes Hospital stays in the last year:: no  Cognitive Assessment Difficulty concentrating, remembering, or making decisions? : no Will 6CIT or Mini Cog be Completed: yes What year is it?: 0 points What month is it?: 0 points Give patient an address phrase to remember (5 components): [address] [place]  About what time is it?: 0 points Count backwards from 20 to 1: 0 points Say the months of the year in reverse: 0 points Repeat the address phrase from earlier: 0 points 6 CIT Score: 0 points  Advance Directives (For Healthcare) Does Patient Have a Medical Advance Directive?: Yes Type of Advance Directive: Healthcare Power of [place]; Living will Copy of  Healthcare Power of Attorney in Chart?: Yes - validated most recent copy scanned in chart (See row information) Copy of Living Will in Chart?: Yes - validated most recent copy scanned in chart (See row information)  Reviewed/Updated  Reviewed/Updated: All        Objective:    Today's Vitals   06/12/24 0855  BP: 110/60  Weight: 172 lb 3.2 oz (78.1 kg)  Height: 5' 7 (1.702 m)   Body mass index is 26.97 kg/m.  Current Medications (verified) Outpatient Encounter Medications as of 06/12/2024  Medication Sig   albuterol  (PROVENTIL  HFA;VENTOLIN  HFA) 108 (90 Base) MCG/ACT inhaler Inhale 2 puffs into the lungs every 6 (six) hours as needed for wheezing or shortness of breath.   atorvastatin  (LIPITOR) 10 MG tablet Take 1 tablet (10 mg total) by mouth daily.   Calcium  Carb-Cholecalciferol  600-800 MG-UNIT TABS Take 1 tablet by mouth daily.   fexofenadine (ALLEGRA) 180 MG tablet Take 180 mg by mouth daily as needed for allergies or rhinitis.   losartan -hydrochlorothiazide  (HYZAAR ) 100-25 MG tablet Take 1 tablet by mouth daily.   magnesium  gluconate (MAGONATE) 500 MG tablet Take 500 mg by mouth at bedtime.   methocarbamol  (ROBAXIN ) 500 MG tablet Take 1 tablet (500 mg total) by mouth every 6 (six) hours as needed for muscle spasms.   metoprolol  succinate (TOPROL -XL) 50 MG 24 hr tablet Take 1&1/2 tablets (75 mg total) by mouth in the morning and at bedtime. Take with or immediately following a meal.   Multiple Vitamin (MULTIVITAMIN) tablet Take 1 tablet by mouth daily.   polyethylene glycol powder (GLYCOLAX /MIRALAX ) 17 GM/SCOOP powder Take 17 g by mouth daily as needed (constipation).   Triamcinolone  Acetonide (NASACORT  ALLERGY 24HR NA) Place 1 spray into the nose daily as needed (allergies).   venlafaxine  XR (EFFEXOR -XR) 75 MG 24 hr capsule Take 1 capsule (75 mg total) by mouth daily.   No facility-administered encounter medications on file as of 06/12/2024.   Hearing/Vision screen Vision  Screening - Comments:: UTD w/visits to Dr Portia Immunizations and Health Maintenance Health Maintenance  Topic Date Due  Medicare Annual Wellness (AWV)  04/15/2024   COVID-19 Vaccine (9 - 2025-26 season) 07/07/2024   Mammogram  10/16/2024   DEXA SCAN  10/06/2026   DTaP/Tdap/Td (3 - Td or Tdap) 07/15/2028   Pneumococcal Vaccine: 50+ Years  Completed   Influenza Vaccine  Completed   Hepatitis C Screening  Completed   Zoster Vaccines- Shingrix   Completed   Meningococcal B Vaccine  Aged Out   Colonoscopy  Discontinued        Assessment/Plan:  This is a routine wellness examination for [place].  Patient Care Team: Avelina Greig BRAVO, MD as PCP - General (Family Medicine) Shlomo Wilbert SAUNDERS, MD as PCP - Cardiology (Cardiology) Cindie Ole DASEN, MD as PCP - Electrophysiology (Cardiology) Portia Fireman, OD (Optometry)  I have personally reviewed and noted the following in the patient's chart:   Medical and social history Use of alcohol, tobacco or illicit drugs  Current medications and supplements including opioid prescriptions. Functional ability and status Nutritional status Physical activity Advanced directives List of other physicians Hospitalizations, surgeries, and ER visits in previous 12 months Vitals Screenings to include cognitive, depression, and falls Referrals and appointments  No orders of the defined types were placed in this encounter.  In addition, I have reviewed and discussed with patient certain preventive protocols, quality metrics, and best practice recommendations. A written personalized care plan for preventive services as well as general preventive health recommendations were provided to patient.   Erminio LITTIE Saris, LPN   88/09/[id number]   No follow-ups on file.  After Visit Summary: (MyChart) Due to this being a telephonic visit, the after visit summary with patients personalized plan was offered to patient via MyChart   Nurse Notes: Pt has no  concerns or questions. AWV made for one year in person

## 2024-06-12 NOTE — Patient Instructions (Signed)
 Ms. Dorough,  Thank you for taking the time for your Medicare Wellness Visit. I appreciate your continued commitment to your health goals. Please review the care plan we discussed, and feel free to reach out if I can assist you further.  Please note that Annual Wellness Visits do not include a physical exam. Some assessments may be limited, especially if the visit was conducted virtually. If needed, we may recommend an in-person follow-up with your provider.  Ongoing Care Seeing your primary care provider every 3 to 6 months helps us  monitor your health and provide consistent, personalized care.   Referrals If a referral was made during today's visit and you haven't received any updates within two weeks, please contact the referred provider directly to check on the status.  Recommended Screenings:  Health Maintenance  Topic Date Due   Medicare Annual Wellness Visit  04/15/2024   COVID-19 Vaccine (9 - 2025-26 season) 07/07/2024   Breast Cancer Screening  10/16/2024   DEXA scan (bone density measurement)  10/06/2026   DTaP/Tdap/Td vaccine (3 - Td or Tdap) 07/15/2028   Pneumococcal Vaccine for age over 50  Completed   Flu Shot  Completed   Hepatitis C Screening  Completed   Zoster (Shingles) Vaccine  Completed   Meningitis B Vaccine  Aged Out   Colon Cancer Screening  Discontinued       06/08/2024    9:57 AM  Advanced Directives  Does Patient Have a Medical Advance Directive? Yes  Type of Estate Agent of [place];Living will  Copy of Healthcare Power of Attorney in Chart? Yes - validated most recent copy scanned in chart (See row information)    Vision: Annual vision screenings are recommended for early detection of glaucoma, cataracts, and diabetic retinopathy. These exams can also reveal signs of chronic conditions such as diabetes and high blood pressure.  Dental: Annual dental screenings help detect early signs of oral cancer, gum disease, and other  conditions linked to overall health, including heart disease and diabetes.

## 2024-06-16 ENCOUNTER — Other Ambulatory Visit (HOSPITAL_COMMUNITY): Payer: Self-pay

## 2024-06-16 ENCOUNTER — Encounter: Payer: Self-pay | Admitting: Family Medicine

## 2024-06-16 ENCOUNTER — Ambulatory Visit: Payer: PPO | Admitting: Family Medicine

## 2024-06-16 VITALS — BP 118/60 | HR 84 | Temp 98.4°F | Ht 67.0 in | Wt 172.0 lb

## 2024-06-16 DIAGNOSIS — J4520 Mild intermittent asthma, uncomplicated: Secondary | ICD-10-CM | POA: Diagnosis not present

## 2024-06-16 DIAGNOSIS — R7989 Other specified abnormal findings of blood chemistry: Secondary | ICD-10-CM

## 2024-06-16 DIAGNOSIS — I10 Essential (primary) hypertension: Secondary | ICD-10-CM | POA: Diagnosis not present

## 2024-06-16 DIAGNOSIS — I4719 Other supraventricular tachycardia: Secondary | ICD-10-CM

## 2024-06-16 DIAGNOSIS — F411 Generalized anxiety disorder: Secondary | ICD-10-CM | POA: Diagnosis not present

## 2024-06-16 DIAGNOSIS — E7800 Pure hypercholesterolemia, unspecified: Secondary | ICD-10-CM | POA: Diagnosis not present

## 2024-06-16 DIAGNOSIS — Z0000 Encounter for general adult medical examination without abnormal findings: Secondary | ICD-10-CM

## 2024-06-16 DIAGNOSIS — G4733 Obstructive sleep apnea (adult) (pediatric): Secondary | ICD-10-CM

## 2024-06-16 MED ORDER — MELOXICAM 15 MG PO TABS
15.0000 mg | ORAL_TABLET | Freq: Every day | ORAL | 0 refills | Status: AC
  Filled 2024-06-16: qty 30, 30d supply, fill #0

## 2024-06-16 MED ORDER — ALBUTEROL SULFATE HFA 108 (90 BASE) MCG/ACT IN AERS
2.0000 | INHALATION_SPRAY | Freq: Four times a day (QID) | RESPIRATORY_TRACT | 2 refills | Status: AC | PRN
  Filled 2024-06-16: qty 6.7, 25d supply, fill #0

## 2024-06-16 NOTE — Assessment & Plan Note (Signed)
 Chronic, well-controlled  LDL at goal on atorvastatin 10 mg p.o. daily

## 2024-06-16 NOTE — Assessment & Plan Note (Signed)
 Chronic well controlled with CPAP.  Energy better overall.

## 2024-06-16 NOTE — Assessment & Plan Note (Signed)
Chronic, continued mild occasional flares.  Using albuterol as needed.

## 2024-06-16 NOTE — Assessment & Plan Note (Addendum)
 Slight increase of ALT again, encouraged low-fat diet,  continue to decrease ETOH and  limit high dose tylenol . Re-eval in 1 year.

## 2024-06-16 NOTE — Assessment & Plan Note (Signed)
 Followed by Cardiology

## 2024-06-16 NOTE — Assessment & Plan Note (Signed)
Stable, chronic.  Continue current medication.  Venlafaxine xl 75 mg daily.

## 2024-06-16 NOTE — Progress Notes (Signed)
 Patient ID: Christina Griffith, female    DOB: [birth year], 73 y.o.   MRN: [id number]  This visit was conducted in person.  BP 118/60   Pulse 84   Temp 98.4 F (36.9 C) (Temporal)   Ht 5' 7 (1.702 m)   Wt 172 lb (78 kg)   SpO2 99%   BMI 26.94 kg/m    CC:  Chief Complaint  Patient presents with   Annual Exam    Part 2 MWV 06/12/24     HPI: Christina Griffith is a 73 y.o. female presenting on 06/16/2024 for Annual Exam (Part 2/MWV 06/12/24)  The patient presents for complete physical and review of chronic health problems. He/She also has the following acute concerns today: none  Elevated Cholesterol: LDL at goal on atorvastatin  10 mg p.o. daily Lab Results  Component Value Date   CHOL 182 06/09/2024   HDL 48.90 06/09/2024   LDLCALC 82 06/09/2024   LDLDIRECT 104.0 05/18/2022   TRIG 252.0 (H) 06/09/2024   CHOLHDL 4 06/09/2024  Using medications without problems:none Muscle aches:  none Diet compliance:  good. Exercise:  Doing PT for back, walking   daily 15 min 6 times a day with dog Other complaints:  Hypertension:  Well controlled on losartan  hydrochlorothiazide  100/25 mg p.o. daily, metoprolol  50 mg 1 tablets p.o. a.m. and  1.5 in p.m. BP Readings from Last 3 Encounters:  06/16/24 118/60  06/12/24 110/60  06/12/23 116/64  Using medication without problems or lightheadedness:   none Chest pain with exertion: none Edema: none Short of breath: none Average home BPs: Other issues:  Mild intermittent asthma: Chronic, stable  PVCs, PAT, PSVT: Followed by cardiology... metoprolol  lower AM dose given fatigue. Reviewed last OV  Dr. Shlomo.. has not seen this year.  OSA in last year... energy is better on CPAP.  GAD chronic, stable on venlafaxine  75 mg p.o. daily    06/12/2023    9:46 AM 05/09/2023   10:14 AM  GAD 7 : Generalized Anxiety Score  Nervous, Anxious, on Edge 0 0  Control/stop worrying 0 0  Worry too much - different things 0 0  Trouble relaxing  0 0  Restless 0 0  Easily annoyed or irritable 0 0  Afraid - awful might happen 0 0  Total GAD 7 Score 0 0  Anxiety Difficulty Not difficult at all Not difficult at all   Elevated LFTs: Slight increase of ALT again, encouraged low-fat diet.   Exercise: limited given back surgery in 2024.. walks daily.  Relevant past medical, surgical, family and social history reviewed and updated as indicated. Interim medical history since our last visit reviewed. Allergies and medications reviewed and updated. Outpatient Medications Prior to Visit  Medication Sig Dispense Refill   atorvastatin  (LIPITOR) 10 MG tablet Take 1 tablet (10 mg total) by mouth daily. 90 tablet 0   Calcium  Carb-Cholecalciferol  600-800 MG-UNIT TABS Take 1 tablet by mouth daily.     fexofenadine (ALLEGRA) 180 MG tablet Take 180 mg by mouth daily as needed for allergies or rhinitis.     losartan -hydrochlorothiazide  (HYZAAR ) 100-25 MG tablet Take 1 tablet by mouth daily. 90 tablet 3   magnesium  gluconate (MAGONATE) 500 MG tablet Take 500 mg by mouth at bedtime.     methocarbamol  (ROBAXIN ) 500 MG tablet Take 1 tablet (500 mg total) by mouth every 6 (six) hours as needed for muscle spasms. 30 tablet 2   metoprolol  succinate (TOPROL -XL) 50 MG 24 hr tablet Take 1&1/2  tablets (75 mg total) by mouth in the morning and at bedtime. Take with or immediately following a meal. 270 tablet 3   Multiple Vitamin (MULTIVITAMIN) tablet Take 1 tablet by mouth daily.     polyethylene glycol powder (GLYCOLAX /MIRALAX ) 17 GM/SCOOP powder Take 17 g by mouth daily as needed (constipation).     Triamcinolone  Acetonide (NASACORT  ALLERGY 24HR NA) Place 1 spray into the nose daily as needed (allergies).     venlafaxine  XR (EFFEXOR -XR) 75 MG 24 hr capsule Take 1 capsule (75 mg total) by mouth daily. 90 capsule 0   albuterol  (PROVENTIL  HFA;VENTOLIN  HFA) 108 (90 Base) MCG/ACT inhaler Inhale 2 puffs into the lungs every 6 (six) hours as needed for wheezing or  shortness of breath. 1 Inhaler 2   No facility-administered medications prior to visit.     Per HPI unless specifically indicated in ROS section below Review of Systems  Constitutional:  Negative for fatigue and fever.  HENT:  Negative for congestion.   Eyes:  Negative for pain.  Respiratory:  Negative for cough and shortness of breath.   Cardiovascular:  Negative for chest pain, palpitations and leg swelling.  Gastrointestinal:  Negative for abdominal pain.  Genitourinary:  Negative for dysuria and vaginal bleeding.  Musculoskeletal:  Negative for back pain.  Neurological:  Negative for syncope, light-headedness and headaches.  Psychiatric/Behavioral:  Negative for dysphoric mood.    Objective:  BP 118/60   Pulse 84   Temp 98.4 F (36.9 C) (Temporal)   Ht 5' 7 (1.702 m)   Wt 172 lb (78 kg)   SpO2 99%   BMI 26.94 kg/m   Wt Readings from Last 3 Encounters:  06/16/24 172 lb (78 kg)  06/12/24 172 lb 3.2 oz (78.1 kg)  06/12/23 167 lb (75.8 kg)      Physical Exam Vitals and nursing note reviewed.  Constitutional:      General: She is not in acute distress.    Appearance: Normal appearance. She is well-developed. She is not ill-appearing or toxic-appearing.  HENT:     Head: Normocephalic.     Right Ear: Hearing, tympanic membrane, ear canal and external ear normal.     Left Ear: Hearing, tympanic membrane, ear canal and external ear normal.     Nose: Nose normal.  Eyes:     General: Lids are normal. Lids are everted, no foreign bodies appreciated.     Conjunctiva/sclera: Conjunctivae normal.     Pupils: Pupils are equal, round, and reactive to light.  Neck:     Thyroid : No thyroid  mass or thyromegaly.     Vascular: No carotid bruit.     Trachea: Trachea normal.  Cardiovascular:     Rate and Rhythm: Normal rate and regular rhythm.     Heart sounds: Normal heart sounds, S1 normal and S2 normal. No murmur heard.    No gallop.  Pulmonary:     Effort: Pulmonary effort  is normal. No respiratory distress.     Breath sounds: Normal breath sounds. No wheezing, rhonchi or rales.  Abdominal:     General: Bowel sounds are normal. There is no distension or abdominal bruit.     Palpations: Abdomen is soft. There is no fluid wave or mass.     Tenderness: There is abdominal tenderness in the left lower quadrant. There is no right CVA tenderness, left CVA tenderness, guarding or rebound.     Hernia: No hernia is present.  Musculoskeletal:     Cervical back: Normal range  of motion and neck supple.  Lymphadenopathy:     Cervical: No cervical adenopathy.  Skin:    General: Skin is warm and dry.     Findings: No rash.  Neurological:     Mental Status: She is alert.     Cranial Nerves: No cranial nerve deficit.     Sensory: No sensory deficit.  Psychiatric:        Mood and Affect: Mood is not anxious or depressed.        Speech: Speech normal.        Behavior: Behavior normal. Behavior is cooperative.        Judgment: Judgment normal.       Results for orders placed or performed in visit on 06/09/24  Lipid panel   Collection Time: 06/09/24  7:54 AM  Result Value Ref Range   Cholesterol 182 0 - 200 mg/dL   Triglycerides 747.9 (H) 0.0 - 149.0 mg/dL   HDL 51.09 >60.99 mg/dL   VLDL 49.5 (H) 0.0 - 59.9 mg/dL   LDL Cholesterol 82 0 - 99 mg/dL   Total CHOL/HDL Ratio 4    NonHDL 132.87   Comprehensive metabolic panel   Collection Time: 06/09/24  7:54 AM  Result Value Ref Range   Sodium 139 135 - 145 mEq/L   Potassium 4.2 3.5 - 5.1 mEq/L   Chloride 101 96 - 112 mEq/L   CO2 32 19 - 32 mEq/L   Glucose, Bld 87 70 - 99 mg/dL   BUN 19 6 - 23 mg/dL   Creatinine, Ser 9.22 0.40 - 1.20 mg/dL   Total Bilirubin 0.4 0.2 - 1.2 mg/dL   Alkaline Phosphatase 81 39 - 117 U/L   AST 31 0 - 37 U/L   ALT 42 (H) 0 - 35 U/L   Total Protein 7.2 6.0 - 8.3 g/dL   Albumin  4.2 3.5 - 5.2 g/dL   GFR 24.07 >39.99 mL/min   Calcium  10.0 8.4 - 10.5 mg/dL     COVID 19 screen:  No  recent travel or known exposure to COVID19 The patient denies respiratory symptoms of COVID 19 at this time. The importance of social distancing was discussed today.   Assessment and Plan The patient's preventative maintenance and recommended screening tests for an annual wellness exam were reviewed in full today. Brought up to date unless services declined.  Counselled on the importance of diet, exercise, and its role in overall health and mortality. The patient's FH and SH was reviewed, including their home life, tobacco status, and drug and alcohol status.    Vaccines:uptodate with PNA,  shingrix , tdap  and flu. Nonsmoker   S/P COVID series x 7 Mammo: nml in 10/2023, repeat q 1 years given endometrial cancer DVE/PAP:  Pap 05/2021 nml, hx of endometrial cancer, released by GYN, no further paps needed. Colon: Colonoscopy  07/2015, hx of polyps, repeat in 5 years.  Dr. Kristie Dec 2023, no further indicated. DEXA: Was on fosamax for osteoporosis in right hip. SE of constipation. Stopped  Fosamax after surgery.  Repeat  DXA 12/2015, 10/2021 NORMAL on no med.   HCV neg in 2014.    Problem List Items Addressed This Visit     Elevated LFTs   Slight increase of ALT again, encouraged low-fat diet,  continue to decrease ETOH and  limit high dose tylenol . Re-eval in 1 year.      GAD (generalized anxiety disorder) (Chronic)   Stable, chronic.  Continue current medication.  Venlafaxine  xl 75  mg daily.      HYPERCHOLESTEROLEMIA (Chronic)   Chronic, well-controlled  LDL at goal on atorvastatin  10 mg p.o. daily      Hypertension (Chronic)   Stable, chronic.  Continue current medication. Well controlled on losartan  hydrochlorothiazide  100/25 mg p.o. daily, metoprolol  50 mg 1 tablets p.o. a.m. and  1.5 in p.m.      Mild intermittent asthma (Chronic)   Chronic, continued mild occasional flares.  Using albuterol  as needed.      Relevant Medications   albuterol  (VENTOLIN  HFA) 108 (90 Base)  MCG/ACT inhaler   OSA (obstructive sleep apnea) (Chronic)    Chronic well controlled with CPAP.  Energy better overall.      PAT (paroxysmal atrial tachycardia) (Chronic)   Followed by Cardiology.      Other Visit Diagnoses       Routine general medical examination at a health care facility    -  Primary          Greig Ring, MD

## 2024-06-16 NOTE — Assessment & Plan Note (Signed)
Stable, chronic.  Continue current medication. Well controlled on losartan hydrochlorothiazide 100/25 mg p.o. daily, metoprolol 50 mg 1 tablets p.o. a.m. and  1.5 in p.m.

## 2024-06-22 ENCOUNTER — Encounter: Payer: Self-pay | Admitting: Pharmacist

## 2024-06-22 NOTE — Progress Notes (Signed)
 Pharmacy Quality Measure Review  This patient is appearing on a report for being at risk of failing the Controlling Blood Pressure measure this calendar year.   Last documented BP   BP Readings from Last 1 Encounters:  06/16/24 118/60   does meet criteria for measure closure (BP <140/90).   2025 f/u scheduled: YES - Cardiology 12/16 Reminder set, review BP if recorded.   Future Appointments  Date Time Provider Department Center  07/21/2024  8:00 AM Parthenia Olivia HERO, PA-C [facility]

## 2024-07-07 NOTE — Progress Notes (Signed)
 Cardiology Office Note:  .   Date:  07/21/2024  ID:  [name], DOB 1951/07/03, MRN [id number] PCP: Avelina Greig BRAVO, MD  [facility] Providers Cardiologist:  Wilbert Bihari, MD Electrophysiologist:  OLE ONEIDA HOLTS, MD    History of Present Illness: .   Christina Griffith is a 73 y.o. female  with a hx of asthma, HTN, Palpitations with PVCs and HTN. 2D echo showed low normal LVF with EF 50-55% with G1DD, mildly enlarged RA and LA, mild MR.  Lexiscan  myoview  done showing no ischemia. She was started on Toprol  XL 25mg  daily for suppression of PVCs.  Cardiac MRI showed normal LVF with EF 56% with no LGE, normal RV, mild MR and TR.     2 week ziopatch showed frequent PVCs, trigeminal PVCs and nonsustained wide complex tachycardia up to 5 beats in a row with PVC load 18%.  SHe also had nonsustained atrial tachycardia up to 16 beats in a row as fast ad 235bpm. Her Toprol  XL was increased to 50mg  daily and she was referred to EP.  AADT with Flecainide was discussed but her palpitations had improved with Toprol  and medical therapy was continued.  Her Toprol  was increased further to 75mg  BID due to reoccurrence of palpitations.  Patient comes in for yearly f/u. She notices palpitations mostly in the month of August when walking her dog in the heat. She checks her pulse and it's skipping.  Patient walks her dog 15 min every 3 hours for a total of 1 1/2 hrs a day and does stretches. BP running lower since she started taking beet gummies.Denies chest pain, dyspnea, edema.   ROS:    Studies Reviewed: SABRA    EKG Interpretation Date/Time:  Tuesday July 21 2024 07:53:28 EST Ventricular Rate:  69 PR Interval:  172 QRS Duration:  82 QT Interval:  372 QTC Calculation: 398 R Axis:   11  Text Interpretation: Normal sinus rhythm Normal ECG When compared with ECG of 06-Feb-2023 08:50, No significant change was found Confirmed by Parthenia Klinefelter [phone]) on 07/21/2024 7:57:42 AM    Prior CV  Studies:   Monitor 03/2021 Normal sinus rhythm is the predominant rhythm There are runs of SVT with the longest being 12.9 seconds.   No other sustained arrhythmias.   Patient triggered events were sinus rhythm or during atrial ectopy.  Risk Assessment/Calculations:         STOP-Bang Score:         Physical Exam:   VS:  BP 108/72   Pulse 69   Ht 5' 7 (1.702 m)   Wt 173 lb 12.8 oz (78.8 kg)   SpO2 99%   BMI 27.22 kg/m    Orhtostatics: No data found. Wt Readings from Last 3 Encounters:  07/21/24 173 lb 12.8 oz (78.8 kg)  06/16/24 172 lb (78 kg)  06/12/24 172 lb 3.2 oz (78.1 kg)    GEN: Well nourished, well developed in no acute distress NECK: No JVD; No carotid bruits CARDIAC:  RRR, no murmurs, rubs, gallops RESPIRATORY:  Clear to auscultation without rales, wheezing or rhonchi  ABDOMEN: Soft, non-tender, non-distended EXTREMITIES:  No edema; No deformity   ASSESSMENT AND PLAN: .   Palpitations/PVCs -2 week ziopatch  showed frequent PVCs with PVC load 18% with NSVT and trigeminal PVCs -seen by EP and discussed AADT vs.PVC ablation but her palpitations had significantly improved on higher dose of BB -cardiac MRI showed normal LVF and RVF with no LGE  -TSH  and Hbg were normal  -Lexiscan  myoview  with no ischemia -Her palpitations have been very well-controlled on Toprol -XL  75 mg BID   Paroxysmal atrial tachycardia -She denies any recent palpitations  -continue Toprol  XL 50 mg every morning and 75 mg every afternoon with as needed refills    HTN -BP has been controlled at home, actually running lower since she's on beet gummies. She'll monitor at home. May be able to reduce Hyzaar . -labs reviewed from last month and are stable.    OSA - The patient is tolerating PAP therapy well without any problems. She missed a few nights while traveling. I have reached out to sleep team to review and contact patient with any changes.   HLD-triglyerides 252, LDL 82 ALT 42 06/2024         Dispo: f/u Dr. Shlomo 1 yr.  Signed, Olivia Pavy, PA-C

## 2024-07-14 ENCOUNTER — Other Ambulatory Visit (HOSPITAL_COMMUNITY): Payer: Self-pay

## 2024-07-14 ENCOUNTER — Other Ambulatory Visit: Payer: Self-pay | Admitting: Family Medicine

## 2024-07-14 MED ORDER — LOSARTAN POTASSIUM-HCTZ 100-25 MG PO TABS
1.0000 | ORAL_TABLET | Freq: Every day | ORAL | 3 refills | Status: AC
  Filled 2024-07-14: qty 90, 90d supply, fill #0

## 2024-07-21 ENCOUNTER — Encounter: Payer: Self-pay | Admitting: Physician Assistant

## 2024-07-21 ENCOUNTER — Ambulatory Visit: Attending: Internal Medicine | Admitting: Physician Assistant

## 2024-07-21 VITALS — BP 108/72 | HR 69 | Ht 67.0 in | Wt 173.8 lb

## 2024-07-21 DIAGNOSIS — I4719 Other supraventricular tachycardia: Secondary | ICD-10-CM

## 2024-07-21 DIAGNOSIS — I493 Ventricular premature depolarization: Secondary | ICD-10-CM

## 2024-07-21 DIAGNOSIS — G4733 Obstructive sleep apnea (adult) (pediatric): Secondary | ICD-10-CM

## 2024-07-21 DIAGNOSIS — R002 Palpitations: Secondary | ICD-10-CM

## 2024-07-21 DIAGNOSIS — I10 Essential (primary) hypertension: Secondary | ICD-10-CM

## 2024-07-21 NOTE — Patient Instructions (Signed)
 Medication Instructions:  Your physician recommends that you continue on your current medications as directed. Please refer to the Current Medication list given to you today.  *If you need a refill on your cardiac medications before your next appointment, please call your pharmacy*  Lab Work: None  If you have labs (blood work) drawn today and your tests are completely normal, you will receive your results only by: MyChart Message (if you have MyChart) OR A paper copy in the mail If you have any lab test that is abnormal or we need to change your treatment, we will call you to review the results.  Testing/Procedures: None   Follow-Up: At [facility], you and your health needs are our priority.  As part of our continuing mission to provide you with exceptional heart care, our providers are all part of one team.  This team includes your primary Cardiologist (physician) and Advanced Practice Providers or APPs (Physician Assistants and Nurse Practitioners) who all work together to provide you with the care you need, when you need it.  Your next appointment:   1 year(s)  Provider:   Wilbert Bihari, MD    We recommend signing up for the patient portal called MyChart.  Sign up information is provided on this After Visit Summary.  MyChart is used to connect with patients for Virtual Visits (Telemedicine).  Patients are able to view lab/test results, encounter notes, upcoming appointments, etc.  Non-urgent messages can be sent to your provider as well.   To learn more about what you can do with MyChart, go to [email or web address].   Other Instructions None

## 2024-08-10 ENCOUNTER — Other Ambulatory Visit: Payer: Self-pay | Admitting: Family Medicine

## 2024-08-10 ENCOUNTER — Other Ambulatory Visit (HOSPITAL_COMMUNITY): Payer: Self-pay

## 2024-08-10 MED ORDER — ATORVASTATIN CALCIUM 10 MG PO TABS
10.0000 mg | ORAL_TABLET | Freq: Every day | ORAL | 3 refills | Status: AC
  Filled 2024-08-10: qty 90, 90d supply, fill #0

## 2024-08-10 MED ORDER — VENLAFAXINE HCL ER 75 MG PO CP24
75.0000 mg | ORAL_CAPSULE | Freq: Every day | ORAL | 1 refills | Status: AC
  Filled 2024-08-10: qty 90, 90d supply, fill #0

## 2024-09-03 ENCOUNTER — Other Ambulatory Visit: Payer: Self-pay | Admitting: Cardiology

## 2024-09-03 DIAGNOSIS — I493 Ventricular premature depolarization: Secondary | ICD-10-CM

## 2024-09-04 ENCOUNTER — Other Ambulatory Visit (HOSPITAL_COMMUNITY): Payer: Self-pay

## 2024-09-04 MED ORDER — METOPROLOL SUCCINATE ER 50 MG PO TB24
75.0000 mg | ORAL_TABLET | Freq: Two times a day (BID) | ORAL | 3 refills | Status: AC
  Filled 2024-09-04: qty 270, 90d supply, fill #0

## 2025-06-10 ENCOUNTER — Other Ambulatory Visit

## 2025-06-17 ENCOUNTER — Ambulatory Visit

## 2025-06-17 ENCOUNTER — Encounter: Admitting: Family Medicine

## 2025-06-22 ENCOUNTER — Ambulatory Visit
